# Patient Record
Sex: Female | Born: 1945 | Race: Black or African American | Hispanic: No | Smoking: Former smoker
Health system: Southern US, Community
[De-identification: ages and names within clinical notes are randomized; demographics above are authoritative.]

## PROBLEM LIST (undated history)

## (undated) DIAGNOSIS — I213 ST elevation (STEMI) myocardial infarction of unspecified site: Secondary | ICD-10-CM

## (undated) DIAGNOSIS — Z95810 Presence of automatic (implantable) cardiac defibrillator: Secondary | ICD-10-CM

## (undated) DIAGNOSIS — I2510 Atherosclerotic heart disease of native coronary artery without angina pectoris: Secondary | ICD-10-CM

## (undated) DIAGNOSIS — I10 Essential (primary) hypertension: Secondary | ICD-10-CM

## (undated) DIAGNOSIS — I429 Cardiomyopathy, unspecified: Secondary | ICD-10-CM

## (undated) DIAGNOSIS — I5021 Acute systolic (congestive) heart failure: Secondary | ICD-10-CM

## (undated) DIAGNOSIS — E079 Disorder of thyroid, unspecified: Secondary | ICD-10-CM

## (undated) DIAGNOSIS — Z7901 Long term (current) use of anticoagulants: Secondary | ICD-10-CM

## (undated) DIAGNOSIS — M109 Gout, unspecified: Secondary | ICD-10-CM

## (undated) DIAGNOSIS — H9190 Unspecified hearing loss, unspecified ear: Secondary | ICD-10-CM

## (undated) DIAGNOSIS — E119 Type 2 diabetes mellitus without complications: Secondary | ICD-10-CM

## (undated) DIAGNOSIS — Z794 Long term (current) use of insulin: Secondary | ICD-10-CM

## (undated) DIAGNOSIS — I253 Aneurysm of heart: Secondary | ICD-10-CM

## (undated) HISTORY — DX: Presence of automatic (implantable) cardiac defibrillator: Z95.810

## (undated) HISTORY — DX: Essential (primary) hypertension: I10

## (undated) HISTORY — DX: Disorder of thyroid, unspecified: E07.9

## (undated) HISTORY — DX: ST elevation (STEMI) myocardial infarction of unspecified site: I21.3

## (undated) HISTORY — PX: CARDIAC CATHETERIZATION: SHX172

## (undated) HISTORY — DX: Cardiomyopathy, unspecified: I42.9

## (undated) HISTORY — PX: COLONOSCOPY: SHX174

## (undated) HISTORY — DX: Type 2 diabetes mellitus without complications: E11.9

## (undated) HISTORY — PX: BREAST BIOPSY: SHX20

## (undated) HISTORY — DX: Long term (current) use of anticoagulants: Z79.01

## (undated) HISTORY — DX: Unspecified hearing loss, unspecified ear: H91.90

## (undated) HISTORY — DX: Acute systolic (congestive) heart failure: I50.21

## (undated) HISTORY — DX: Aneurysm of heart: I25.3

## (undated) HISTORY — DX: Type 2 diabetes mellitus without complications: Z79.4

## (undated) HISTORY — PX: CHOLECYSTECTOMY: SHX55

## (undated) HISTORY — PX: ABDOMINAL HYSTERECTOMY: SHX81

## (undated) HISTORY — PX: APPENDECTOMY: SHX54

## (undated) HISTORY — DX: Atherosclerotic heart disease of native coronary artery without angina pectoris: I25.10

---

## 2015-08-25 DIAGNOSIS — I2510 Atherosclerotic heart disease of native coronary artery without angina pectoris: Secondary | ICD-10-CM | POA: Insufficient documentation

## 2015-09-11 HISTORY — PX: CARDIAC DEFIBRILLATOR PLACEMENT: SHX171

## 2018-08-18 ENCOUNTER — Encounter: Payer: Self-pay | Admitting: Family Medicine

## 2018-08-19 ENCOUNTER — Encounter: Payer: Self-pay | Admitting: Family Medicine

## 2018-08-19 ENCOUNTER — Ambulatory Visit (INDEPENDENT_AMBULATORY_CARE_PROVIDER_SITE_OTHER): Payer: Medicare Other | Admitting: Family Medicine

## 2018-08-19 VITALS — BP 122/78 | HR 72 | Temp 97.9°F | Resp 18 | Ht 66.0 in | Wt 203.0 lb

## 2018-08-19 DIAGNOSIS — I10 Essential (primary) hypertension: Secondary | ICD-10-CM | POA: Diagnosis not present

## 2018-08-19 DIAGNOSIS — Z794 Long term (current) use of insulin: Secondary | ICD-10-CM | POA: Diagnosis not present

## 2018-08-19 DIAGNOSIS — E039 Hypothyroidism, unspecified: Secondary | ICD-10-CM

## 2018-08-19 DIAGNOSIS — E1159 Type 2 diabetes mellitus with other circulatory complications: Secondary | ICD-10-CM | POA: Diagnosis not present

## 2018-08-19 DIAGNOSIS — Z7689 Persons encountering health services in other specified circumstances: Secondary | ICD-10-CM

## 2018-08-19 DIAGNOSIS — E785 Hyperlipidemia, unspecified: Secondary | ICD-10-CM | POA: Diagnosis not present

## 2018-08-19 DIAGNOSIS — J189 Pneumonia, unspecified organism: Secondary | ICD-10-CM

## 2018-08-19 DIAGNOSIS — Z7901 Long term (current) use of anticoagulants: Secondary | ICD-10-CM

## 2018-08-19 DIAGNOSIS — Z95810 Presence of automatic (implantable) cardiac defibrillator: Secondary | ICD-10-CM

## 2018-08-19 DIAGNOSIS — I253 Aneurysm of heart: Secondary | ICD-10-CM

## 2018-08-19 LAB — POCT URINALYSIS DIP (CLINITEK)
Bilirubin, UA: NEGATIVE
Glucose, UA: NEGATIVE mg/dL
Ketones, POC UA: NEGATIVE mg/dL
Leukocytes, UA: NEGATIVE
Nitrite, UA: NEGATIVE
POC PROTEIN,UA: 100 — AB
Spec Grav, UA: >=1.03 — AB (ref 1.010–1.025)
Urobilinogen, UA: 0.2 E.U./dL
pH, UA: 5 (ref 5.0–8.0)

## 2018-08-19 LAB — GLUCOSE, POCT (MANUAL RESULT ENTRY): POC GLUCOSE: 215 mg/dL — AB (ref 70–99)

## 2018-08-19 MED ORDER — PROMETHAZINE-DM 6.25-15 MG/5ML PO SYRP: 5.0000 mL | ORAL_SOLUTION | Freq: Every evening | ORAL | 0 refills | Status: DC | PRN

## 2018-08-19 MED ORDER — BENZONATATE 100 MG PO CAPS: 100.0000 mg | ORAL_CAPSULE | Freq: Three times a day (TID) | ORAL | 0 refills | Status: DC | PRN

## 2018-08-19 MED ORDER — MUCUS DM 30-600 MG PO TB12: 1.0000 | ORAL_TABLET | Freq: Two times a day (BID) | ORAL | 1 refills | Status: DC

## 2018-08-19 NOTE — Patient Instructions (Addendum)
Thank you for choosing Primary Care at [facility] to be your medical home!    Desiree Harrison was seen by Molli Barrows, FNP today.   Desiree Harrison primary care provider is Scot Jun, FNP.   For the best care possible, you should try to see Molli Barrows, FNP-C whenever you come to the clinic.   We look forward to seeing you again soon!  If you have any questions about your visit today, please call us at [phone] or feel free to reach your primary care provider via [place].     For your chest x-ray I would like for you to have it performed on 08/26/2018 at  [facility] the address is [address], [place], [place] [address] I will notify you of the results of your chest x-ray.  At your follow-up appointment I will give you the booster dose of your pneumonia vaccine. You will be notified of your lab results within 7 to 10 days.  You have been referred to cardiology and their office will call you directly to schedule an establish care appointment.   Community-Acquired Pneumonia, Adult Pneumonia is an infection of the lungs. One type of pneumonia can happen while a person is in a hospital. A different type can happen when a person is not in a hospital (community-acquired pneumonia). It is easy for this kind to spread from person to person. It can spread to you if you breathe near an infected person who coughs or sneezes. Some symptoms include:  A dry cough.  A wet (productive) cough.  Fever.  Sweating.  Chest pain.  Follow these instructions at home:  Take over-the-counter and prescription medicines only as told by your doctor. ? Only take cough medicine if you are losing sleep. ? If you were prescribed an antibiotic medicine, take it as told by your doctor. Do not stop taking the antibiotic even if you start to feel better.  Sleep with your head and neck raised (elevated). You can do this by putting a few pillows under your head, or you can sleep in a  recliner.  Do not use tobacco products. These include cigarettes, chewing tobacco, and e-cigarettes. If you need help quitting, ask your doctor.  Drink enough water to keep your pee (urine) clear or pale yellow. A shot (vaccine) can help prevent pneumonia. Shots are often suggested for:  People older than 71 years of age.  People older than 71 years of age: ? Who are having cancer treatment. ? Who have long-term (chronic) lung disease. ? Who have problems with their body's defense system (immune system).  You may also prevent pneumonia if you take these actions:  Get the flu (influenza) shot every year.  Go to the dentist as often as told.  Wash your hands often. If soap and water are not available, use hand sanitizer.  Contact a doctor if:  You have a fever.  You lose sleep because your cough medicine does not help. Get help right away if:  You are short of breath and it gets worse.  You have more chest pain.  Your sickness gets worse. This is very serious if: ? You are an older adult. ? Your body's defense system is weak.  You cough up blood. This information is not intended to replace advice given to you by your health care provider. Make sure you discuss any questions you have with your health care provider. Document Released: 02/13/2008 Document Revised: 02/02/2016 Document Reviewed: 12/22/2014 Elsevier Interactive Patient  Education  2018 Elsevier Inc.  

## 2018-08-19 NOTE — Progress Notes (Signed)
Desiree Harrison, is a 72 y.o. female  [id number]  KVQ:[id number]  DOB - 03-22-1946  CC:  Chief Complaint  Patient presents with  . Establish Care  . Diabetes  . Hyperlipidemia       HPI: Desiree Harrison is a 72 y.o. female is here today to establish care.   Kaitlin Alcindor has Chronic combined systolic and diastolic heart failure ([place]); Diabetes mellitus ([place]); Essential hypertension; Ventricular aneurysm; Long term current use of anticoagulant therapy; Hypothyroidism; Hyperlipidemia; Pneumonia of both lungs due to infectious organism; and ICD (implantable cardioverter-defibrillator) in place on their problem list.    Today's visit:  Desiree Harrison is here today to establish care.   Medical problems significant for ventricular aneurysm, she has an ICD in place, hypertension, hyperlipidemia, diabetes, hypothyroidism, and gout.  Patient was followed by [facility] cardiology in [place], [place] and has since relocated to [place], [place]. 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 2017 ICD placed due to poor LV functioning. She is taking chronic anticoagulation with Warfarin. Last PT/INR 2.1. Last PT/INR check 30 days ago. Denies chest pain, activity intolerance, shortness of breath. She currently has a cough was recently diagnosed with pneumonia. She was treated with Levaquin and feels better overall, remains concern regarding cough.    She also suffers from diabetes unknown of last A1c.  She is currently on a long acting and short acting insulin.  Blood sugars range in mid 200's and 300's most often. Reports blood sugars have always remained high. Denies any denies any knowledge of retinopathy or renal impairment.  Recently diagnosed with pneumonia at a urgent care on 07/27/2018.  She continues to complain of a lingering cough that has not improved. Patient denies new headaches, chest pain, abdominal pain, nausea, new weakness , numbness or tingling, SOB, edema, or worrisome cough.  .   Current medications: Current Outpatient Medications:  .  amLODipine (NORVASC) 5 MG tablet, TAKE 1 TABLET BY MOUTH EVERY DAY, Disp: , Rfl:  .  aspirin EC 81 MG tablet, TAKE 1 TABLET BY MOUTH EVERY DAY, Disp: , Rfl:  .  atorvastatin (LIPITOR) 80 MG tablet, TAKE ONE TABLET BY MOUTH DAILY AT 6 PM, Disp: , Rfl:  .  carvedilol (COREG) 25 MG tablet, TAKE 1 AND 1/2 TABLETS BY MOUTH TWICE A DAY WITH MEALS, Disp: , Rfl:  .  Insulin Lispro Prot & Lispro (HUMALOG 75/25 MIX) (75-25) 100 UNIT/ML Kwikpen, Inject 25 Units into the skin 2 (two) times daily., Disp: , Rfl:  .  warfarin (COUMADIN) 1 MG tablet, Take by mouth., Disp: , Rfl:  .  warfarin (COUMADIN) 3 MG tablet, TAKE 1 TABLET BY MOUTH EVERY DAY AT 6 PM, Disp: , Rfl:  .  colchicine 0.6 MG tablet, Take 1 tablet by mouth as needed for pain., Disp: , Rfl:  .  gabapentin (NEURONTIN) 100 MG capsule, Take 200 mg by mouth 3 (three) times daily., Disp: , Rfl: 1 .  levothyroxine (SYNTHROID, LEVOTHROID) 50 MCG tablet, Take 50 mcg by mouth daily., Disp: , Rfl: 0 .  PROAIR HFA 108 (90 Base) MCG/ACT inhaler, 1 PUFF BY MOUTH EVERY 6 HOURS AS NEEDED AS NEEDED, Disp: , Rfl: 0   Pertinent family medical history: family history includes Breast cancer in her mother; Coronary artery disease in her mother; Diabetes in her mother; Stroke in her father.   Allergies  Allergen Reactions  . Lisinopril Cough    Social History   Socioeconomic History  . Marital status:  Widowed    Spouse name: Not on file  . Number of children: Not on file  . Years of education: Not on file  . Highest education level: Not on file  Occupational History  . Not on file  Social Needs  . Financial resource strain: Not on file  . Food insecurity:    Worry: Not on file    Inability: Not on file  . Transportation needs:    Medical: Not on file    Non-medical: Not on file  Tobacco Use  . Smoking status: Former Research scientist (life sciences)  . Smokeless tobacco: Never Used  Substance and Sexual Activity   . Alcohol use: Not Currently  . Drug use: Never  . Sexual activity: Not on file  Lifestyle  . Physical activity:    Days per week: Not on file    Minutes per session: Not on file  . Stress: Not on file  Relationships  . Social connections:    Talks on phone: Not on file    Gets together: Not on file    Attends religious service: Not on file    Active member of club or organization: Not on file    Attends meetings of clubs or organizations: Not on file    Relationship status: Not on file  . Intimate partner violence:    Fear of current or ex partner: Not on file    Emotionally abused: Not on file    Physically abused: Not on file    Forced sexual activity: Not on file  Other Topics Concern  . Not on file  Social History Narrative  . Not on file   Review of Systems: Constitutional: Negative for fever, chills, diaphoresis, activity change, appetite change and fatigue. HENT: Negative for ear pain, nosebleeds, congestion, facial swelling, rhinorrhea, neck pain, neck stiffness and ear discharge.  Eyes: Negative for pain, discharge, redness, itching and visual disturbance. Respiratory: Negative for cough, choking, chest tightness, shortness of breath, wheezing and stridor.  Cardiovascular: Negative for chest pain, palpitations and leg swelling. Gastrointestinal: Negative for abdominal distention or abdominal tenderness present.  Genitourinary: Negative for dysuria, urgency, frequency, hematuria, flank pain, decreased urine volume, difficulty urinating. Musculoskeletal: Negative for back pain, joint swelling, arthralgia and gait problem. Neurological: Negative for dizziness, tremors, seizures, syncope, facial asymmetry, speech difficulty, weakness, light-headedness, numbness and headaches.  Hematological: Negative for adenopathy. Does not bruise/bleed easily. Psychiatric/Behavioral: Negative for hallucinations, behavioral problems, confusion, dysphoric mood, decreased concentration and  agitation.    Objective:   Vitals:   08/19/18 0852  BP: 122/78  Pulse: 72  Resp: 18  Temp: 97.9 F (36.6 C)  SpO2: 94%    BP Readings from Last 3 Encounters:  08/21/18 126/76  08/19/18 122/78    Filed Weights   08/19/18 0852  Weight: 203 lb (92.1 kg)     Physical Exam: Constitutional: Patient appears well-developed and well-nourished. No distress. HENT: Normocephalic, atraumatic, External right and left ear normal. Oropharynx is clear and moist.  Eyes: Conjunctivae and EOM are normal. PERRLA, no scleral icterus. Neck: Normal ROM. Neck supple. No JVD. No tracheal deviation. No thyromegaly. CVS: RRR, S1/S2 +, no murmurs, no gallops, no carotid bruit.  Pulmonary: Effort and breath sounds normal, no stridor, rhonchi, wheezes, rales.  Abdominal: Soft. BS +, no distension, tenderness, rebound or guarding.  Musculoskeletal: Normal range of motion. No edema and no tenderness.  Neuro: Alert. Normal muscle tone coordination. Normal gait. BUE and BLE strength 5/5. Bilateral hand grips symmetrical. Skin: Skin is warm  and dry. No rash noted. Not diaphoretic. No erythema. No pallor. Psychiatric: Normal mood and affect. Behavior, judgment, thought content normal.     Assessment and plan:  1. Encounter to establish care 2. Type 2 diabetes mellitus with other circulatory complication, with long-term current use of insulin ([place]), Last A1C unknown.  POCT glucose (manual entry)-215 Aim for 30 minutes of exercise most days, with a goal of 150 minutes per week. -Glucose monitoring at minimal of twice daily and keep a log of readings. -Commit to medication adherence and self-adjustment as needed -increase foods containing whole grains (one-half of grain intake). -saturated fat intake should be reduced -reduce intake of trans fat (lowers LDL cholesterol and increases HDL cholesterol) -Eat 4-5 small meals during the day to reduce the risk of becoming hungry. Checking: - Hemoglobin A1c, will  adjust diabetes regimen   3. Essential hypertension, well-controlled today. We have discussed target BP range and blood pressure goal. I have advised patient to check BP regularly and to call us back or report to clinic if the numbers are consistently higher than 140/90. We discussed the importance of compliance with medical therapy and DASH diet recommended, consequences of uncontrolled hypertension discussed.  - continue current BP medications  4. Ventricular aneurysm, chronically anticoagulated with warfarin and followed by the Coumadin clinic.  Patient has been referred to cardiology for chronic management as well as to establish with their Coumadin clinic at [facility]. - Ambulatory referral to Cardiology  5. Long term current use of anticoagulant therapy - Ambulatory referral to Cardiology -Patient reports last INR 2.1.  She will establish with the Coumadin clinic at [facility]   6. Hypothyroidism, unspecified type - Thyroid Panel With TSH Continue levothyroxine at current dose.  Will adjust if warranted by labs.  7. Hyperlipidemia, unspecified hyperlipidemia type - Lipid panel, checking a fasting lipid panel today. -Patient is currently managed on statin therapy. - Ambulatory referral to Cardiology  8. Pneumonia of both lungs due to infectious organism, unspecified part of lung Repeat chest x-ray in 2 weeks. Refilled anti-tussive medication.  - DG Chest 2 View; Future - CBC with Differential, rechecking today  9. ICD (implantable cardioverter-defibrillator) in place - Ambulatory referral to Cardiology   Return in about 6 weeks (around 09/30/2018) for Chronic conditions follow-up.   A total of  40 minutes spent, greater than 50 % of this time was spent counseling and coordination of care.  The patient was given clear instructions to go to ER or return to medical center if symptoms don't improve, worsen or new problems develop. The patient verbalized understanding. The patient  was advised  to call and obtain lab results if they haven't heard anything from out office within 7-10 business days.  Molli Barrows, FNP Primary Care at [facility] [address] [place] [address] [phone]: [phone]    This note has been created with Dragon speech recognition software and Engineer, materials. Any transcriptional errors are unintentional.

## 2018-08-20 LAB — CBC WITH DIFFERENTIAL/PLATELET
Basophils Absolute: 0.1 10*3/uL (ref 0.0–0.2)
Basos: 1 %
EOS (ABSOLUTE): 0.2 10*3/uL (ref 0.0–0.4)
Eos: 3 %
Hematocrit: 43.1 % (ref 34.0–46.6)
Hemoglobin: 13.8 g/dL (ref 11.1–15.9)
Immature Grans (Abs): 0 10*3/uL (ref 0.0–0.1)
Immature Granulocytes: 0 %
Lymphocytes Absolute: 2.1 10*3/uL (ref 0.7–3.1)
Lymphs: 30 %
MCH: 26.6 pg (ref 26.6–33.0)
MCHC: 32 g/dL (ref 31.5–35.7)
MCV: 83 fL (ref 79–97)
Monocytes Absolute: 0.6 10*3/uL (ref 0.1–0.9)
Monocytes: 9 %
NEUTROS PCT: 57 %
Neutrophils Absolute: 4 10*3/uL (ref 1.4–7.0)
Platelets: 226 10*3/uL (ref 150–450)
RBC: 5.19 x10E6/uL (ref 3.77–5.28)
RDW: 14.4 % (ref 12.3–15.4)
WBC: 6.9 10*3/uL (ref 3.4–10.8)

## 2018-08-20 LAB — HEMOGLOBIN A1C
Est. average glucose Bld gHb Est-mCnc: 338 mg/dL
Hgb A1c MFr Bld: 13.4 % — ABNORMAL HIGH (ref 4.8–5.6)

## 2018-08-20 LAB — THYROID PANEL WITH TSH
Free Thyroxine Index: 2.3 (ref 1.2–4.9)
T3 Uptake Ratio: 30 % (ref 24–39)
T4, Total: 7.7 ug/dL (ref 4.5–12.0)
TSH: 4.12 u[IU]/mL (ref 0.450–4.500)

## 2018-08-20 LAB — LIPID PANEL
Chol/HDL Ratio: 3.4 ratio (ref 0.0–4.4)
Cholesterol, Total: 108 mg/dL (ref 100–199)
HDL: 32 mg/dL — ABNORMAL LOW (ref >39)
LDL Calculated: 49 mg/dL (ref 0–99)
Triglycerides: 136 mg/dL (ref 0–149)
VLDL Cholesterol Cal: 27 mg/dL (ref 5–40)

## 2018-08-21 ENCOUNTER — Encounter: Payer: Self-pay | Admitting: Cardiovascular Disease

## 2018-08-21 ENCOUNTER — Ambulatory Visit: Payer: Medicare Other | Admitting: Cardiovascular Disease

## 2018-08-21 ENCOUNTER — Ambulatory Visit (INDEPENDENT_AMBULATORY_CARE_PROVIDER_SITE_OTHER): Payer: Medicare Other | Admitting: Pharmacist Clinician (PhC)/ Clinical Pharmacy Specialist

## 2018-08-21 ENCOUNTER — Telehealth: Payer: Self-pay | Admitting: Family Medicine

## 2018-08-21 VITALS — BP 126/76 | HR 73 | Ht 67.5 in | Wt 206.0 lb

## 2018-08-21 DIAGNOSIS — Z95810 Presence of automatic (implantable) cardiac defibrillator: Secondary | ICD-10-CM

## 2018-08-21 DIAGNOSIS — E1169 Type 2 diabetes mellitus with other specified complication: Secondary | ICD-10-CM

## 2018-08-21 DIAGNOSIS — I10 Essential (primary) hypertension: Secondary | ICD-10-CM

## 2018-08-21 DIAGNOSIS — Z7901 Long term (current) use of anticoagulants: Secondary | ICD-10-CM | POA: Diagnosis not present

## 2018-08-21 DIAGNOSIS — I2510 Atherosclerotic heart disease of native coronary artery without angina pectoris: Secondary | ICD-10-CM

## 2018-08-21 DIAGNOSIS — I253 Aneurysm of heart: Secondary | ICD-10-CM

## 2018-08-21 DIAGNOSIS — E669 Obesity, unspecified: Secondary | ICD-10-CM

## 2018-08-21 DIAGNOSIS — I5042 Chronic combined systolic (congestive) and diastolic (congestive) heart failure: Secondary | ICD-10-CM

## 2018-08-21 DIAGNOSIS — E785 Hyperlipidemia, unspecified: Secondary | ICD-10-CM

## 2018-08-21 DIAGNOSIS — E119 Type 2 diabetes mellitus without complications: Secondary | ICD-10-CM

## 2018-08-21 NOTE — Progress Notes (Signed)
Cardiology Office Note:    Date:  08/21/2018   ID:  Desiree Harrison, DOB 1946/06/13, MRN [id number]  PCP:  Scot Jun, FNP  Cardiologist:  Sanda Klein, MD  Electrophysiologist:  None   Referring MD: Scot Jun, FNP   Chief Complaint  Patient presents with  . Establish Care  . Congestive Heart Failure  . Coronary Artery Disease  . Pacemaker Check  Desiree Harrison is a 72 y.o. female who is being seen today for the evaluation of CHF/CAD/ICD at the request of Scot Jun, FNP.   History of Present Illness:    Desiree Harrison is a 72 y.o. 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  She is on chronic warfarin anticoagulation.  She also has diabetes mellitus requiring insulin, complicated by neuropathy, hypertension and hypercholesterolemia, history of gout.  To date, she has not received therapy from her defibrillator.  She has been relocated to [facility] and is establishing follow-up here with both primary care and cardiology.  She has been seeing Dr. Sabino Donovan at [facility], most recent visit in August 2019.  Her initial myocardial infarction was in November 2016.  She did not have angina pectoris, but her myocardial infarction manifested as nausea and vomiting shortness of breath.  She presented late and she was found to have a persistently occluded LAD that was not amenable to PCI.  There were no other meaningful coronary stenoses.  She had an aneurysmal apex.  Roughly 3 months later she underwent implantation of a defibrillator(Dr. Remus Blake, single-chamber [place] serial  [id number], right ventricular lead [id number], DFT testing was not performed).  She has not required hospitalizations for heart failure as far as I can tell has never had documented LV apical thrombus or embolic events, but there was considerable concern about this possibility because of the aneurysmal apex.  She feels well. The patient specifically denies any chest pain at rest exertion, dyspnea at rest or with exertion, orthopnea, paroxysmal nocturnal dyspnea, syncope, palpitations, focal neurological deficits, intermittent claudication, lower extremity edema, unexplained weight gain, cough, hemoptysis or wheezing.  Comprehensive interrogation of her defibrillator was performed today.  The presenting rhythm is sinus and she does not require ventricular pacing.  Battery voltage is essentially still the beginning of life with estimated generator longevity of 12 years.  Last charge time was 10.8 seconds.  RV lead impedance is 879 ohms (high-voltage 69 ohms), pacing threshold is 0.7 V at 0.4 ms and the sensed R waves are 7.5 mV.  These values are comparable to her previous session.  The device was programmed with 3 zones including a VT 1 zone with therapies of 70 bpm.  Since the device was implanted for primary prevention and there has been no detected ventricular tachycardia since device implantation, I reprogrammed it as a "shock box" with therapy beginning only at 200 bpm, but left a monitor zone at 170 bpm.  Past Medical History:  Diagnosis Date  . Acute systolic heart failure ([place])   . CAD in native artery   . Cardiomyopathy ([place])   . Long term (current) use of anticoagulants   . S/P internal cardiac defibrillator procedure  placed 10/28/15  . STEMI (ST elevation myocardial infarction) ([place])   . Type 2 diabetes mellitus, with long-term current use of insulin ([place])   . Ventricular aneurysm     Past Surgical History:  Procedure Laterality Date  . ABDOMINAL HYSTERECTOMY    . APPENDECTOMY    .  BREAST BIOPSY Left   . CARDIAC CATHETERIZATION    . CHOLECYSTECTOMY      Current Medications: Current Meds  Medication Sig  . amLODipine (NORVASC) 5 MG tablet TAKE 1 TABLET BY MOUTH EVERY DAY  . aspirin EC 81 MG tablet TAKE 1 TABLET BY MOUTH EVERY DAY  . atorvastatin (LIPITOR) 80 MG tablet TAKE ONE TABLET BY MOUTH DAILY AT 6 PM  . benzonatate (TESSALON) 100 MG capsule Take 1-2 capsules (100-200 mg total) by mouth 3 (three) times daily as needed for cough.  . carvedilol (COREG) 25 MG tablet Take 25 mg by mouth 2 (two) times daily with a meal.   . Dextromethorphan-guaiFENesin (MUCUS DM) 30-600 MG TB12 Take 1 tablet by mouth 2 (two) times daily.  Marland Kitchen gabapentin (NEURONTIN) 100 MG capsule Take 200 mg by mouth 2 (two) times daily.   . insulin lispro (HUMALOG) 100 UNIT/ML injection Inject 10 Units into the skin 3 (three) times daily before meals.  . insulin lispro protamine-lispro (HUMALOG 50/50 MIX) (50-50) 100 UNIT/ML SUSP injection Inject 40 Units into the skin 2 (two) times daily.  Marland Kitchen levothyroxine (SYNTHROID, LEVOTHROID) 50 MCG tablet Take 50 mcg by mouth daily.  Marland Kitchen PROAIR HFA 108 (90 Base) MCG/ACT inhaler 1 PUFF BY MOUTH EVERY 6 HOURS AS NEEDED AS NEEDED  . promethazine-dextromethorphan (PROMETHAZINE-DM) 6.25-15 MG/5ML syrup Take 5 mLs by mouth at bedtime as needed for cough.  . warfarin (COUMADIN) 3 MG tablet TAKE 1 TABLET BY MOUTH EVERY DAY AT 6 PM  . [DISCONTINUED] warfarin (COUMADIN) 1 MG tablet Take by mouth.     Allergies:   Lisinopril   Social History   Socioeconomic History  . Marital status: Widowed    Spouse name: Not on file  . Number of children: Not on file  . Years of education: Not on file  . Highest education level: Not on file  Occupational History  . Not on file  Social Needs  . Financial resource strain: Not on file  . Food insecurity:    Worry: Not on file    Inability: Not on file  . Transportation needs:    Medical: Not on file    Non-medical: Not on file    Tobacco Use  . Smoking status: Former Research scientist (life sciences)  . Smokeless tobacco: Never Used  Substance and Sexual Activity  . Alcohol use: Not Currently  . Drug use: Never  . Sexual activity: Not on file  Lifestyle  . Physical activity:    Days per week: Not on file    Minutes per session: Not on file  . Stress: Not on file  Relationships  . Social connections:    Talks on phone: Not on file    Gets together: Not on file    Attends religious service: Not on file    Active member of club or organization: Not on file    Attends meetings of clubs or organizations: Not on file    Relationship status: Not on file  Other Topics Concern  . Not on file  Social History Narrative  . Not on file     Family History: The patient's family history includes Breast cancer in her mother; Coronary  artery disease in her mother; Diabetes in her mother; Stroke in her father.  The patient's mother had bypass surgery at age 48 but lived to be 71 years old.  Patient's grandmother died with a myocardial infarction at age 54.  ROS:   Please see the history of present illness.     All other systems reviewed and are negative.  EKGs/Labs/Other Studies Reviewed:    The following studies were reviewed today: Extensive review of office notes from Dr. Sabino Donovan and the defibrillator implantation procedure from Dr. Remus Blake.  Review of previous echocardiogram reports.  Review of labs.  EKG:  EKG is  ordered today.  The ekg ordered today demonstrates sinus rhythm, generalized low voltage with very delayed R wave progression across the anterior precordial leads, but without repolarization abnormalities.  QTc 42 ms.  Recent Labs: 08/19/2018: Hemoglobin 13.8; Platelets 226; TSH 4.120  Recent Lipid Panel    Component Value Date/Time   CHOL 108 08/19/2018 0919   TRIG 136 08/19/2018 0919   HDL 32 (L) 08/19/2018 0919   CHOLHDL 3.4 08/19/2018 0919   LDLCALC 49 08/19/2018 0919    Physical Exam:    VS:  BP 126/76 (BP  Location: Left Arm, Patient Position: Sitting, Cuff Size: Large)   Pulse 73   Ht 5' 7.5" (1.715 m)   Wt 206 lb (93.4 kg)   BMI 31.79 kg/m   Wt Readings from Last 3 Encounters:  08/21/18 206 lb (93.4 kg)  08/19/18 203 lb (92.1 kg)     GEN: Mildly obese, well nourished, well developed in no acute distress HEENT: Normal NECK: No JVD; No carotid bruits LYMPHATICS: No lymphadenopathy CARDIAC: RRR, no murmurs, rubs, gallops RESPIRATORY:  Clear to auscultation without rales, wheezing or rhonchi  ABDOMEN: Soft, non-tender, non-distended MUSCULOSKELETAL:  No edema; No deformity  SKIN: Warm and dry NEUROLOGIC:  Alert and oriented x 3 PSYCHIATRIC:  Normal affect   ASSESSMENT:    1. Coronary artery disease involving native coronary artery of native heart without angina pectoris   2. Chronic combined systolic and diastolic heart failure ([place])   3. Left ventricular aneurysm   4. Long term current use of anticoagulant   5. ICD (implantable cardioverter-defibrillator) in place   6. Dyslipidemia (high LDL; low HDL)   7. Diabetes mellitus type 2 in obese ([place])   8. Essential hypertension    PLAN:    In order of problems listed above:  1. CHF: Well compensated, NYHA functional class I, on appropriate treatment with carvedilol, but not on angiotensin receptor blocker (history of cough with ACE inhibitor).  Improved ejection fraction up to 50%.  Her cardiologist note from August reports that she was receiving losartan 100 mg once daily, but this is not her current medication list.  It may simply be an error.  We will ask patient to check when she gets home. 2. CAD: Asymptomatic.  Note that she never had angina before her myocardial infarction but presented with nausea, vomiting, dyspnea.  On aspirin and statin. 3. LV aneurysm: Without confirmed thrombus or embolic events in the past. 4. Warfarin: No bleeding complications despite simultaneous use of aspirin.  At this point in time I do not see  any reason to change the plan for prevention of systemic embolism.  If she does have bleeding complications may have to revisit the risks/benefits of chronic anticoagulation with warfarin.  Enrolled in our Coumadin clinic. 5. ICD: Normal device function.  Enrolled in our device clinic.  Since she received the  device for primary prevention and in the couple of years since implantation there have been no episodes of even nonsustained VT, I reprogrammed it to a single treatment zone VF at 200 bpm. 6. HLP: Excellent lipid profile with exception of a low HDL cholesterol.  This might improve with increased physical activity and weight loss. 7. DM: Notes report that she was intolerant to [place], although an SGLT2 inhibitor would be a good choice in a patient with depressed EF from coronary disease.  GLP-1 agonist such as Victoza is another consideration.  Avoid Actos. 8. HTN: Excellent control.  Need to clarify whether or not she is still taking losartan.   Medication Adjustments/Labs and Tests Ordered: Current medicines are reviewed at length with the patient today.  Concerns regarding medicines are outlined above.  No orders of the defined types were placed in this encounter.  No orders of the defined types were placed in this encounter.   Patient Instructions  Medication Instructions:  Dr Sallyanne Kuster recommends that you continue on your current medications as directed. Please refer to the Current Medication list given to you today.  If you need a refill on your cardiac medications before your next appointment, please call your pharmacy.   Testing/Procedures: Remote monitoring is used to monitor your Pacemaker or ICD from home. This monitoring reduces the number of office visits required to check your device to one time per year. It allows [place] to keep an eye on the functioning of your device to ensure it is working properly. You are scheduled for a device check from home on Thursday, March 12th, 2020.  You may send your transmission at any time that day. If you have a wireless device, the transmission will be sent automatically. After your physician reviews your transmission, you will receive a postcard with your next transmission date.  To improve our patient care and to more adequately follow your device, [facility] has decided, as a practice, to start following each patient four times a year with your home monitor. This means that you may experience a remote appointment that is close to an in-office appointment with your physician. Your insurance will apply at the same rate as other remote monitoring transmissions.  Follow-Up: At [facility], you and your health needs are our priority.  As part of our continuing mission to provide you with exceptional heart care, we have created designated Provider Care Teams.  These Care Teams include your primary Cardiologist (physician) and Advanced Practice Providers (APPs -  Physician Assistants and Nurse Practitioners) who all work together to provide you with the care you need, when you need it. You will need a follow up appointment in 12 months. You may see Sanda Klein, MD or one of the following Advanced Practice Providers on your designated Care Team: [place], [place] . Fabian Sharp, PA-C . You will receive a reminder letter in the mail two months in advance. If you don't receive a letter, please call our office to schedule the follow-up appointment.    Signed, Sanda Klein, MD  08/21/2018 1:05 PM    [facility]

## 2018-08-21 NOTE — Telephone Encounter (Signed)
Contact patient to advise that her most recent labs were significant for an abnormal hemoglobin A1c.  Her current level of her A1c is 13.4.  I would like for her to increase the dose of her Humalog 50-50 to 50 units twice daily continue short acting insulin 10 units with each meal.  I would also like for her to bring her meter to her next follow-up visit.  Her labs were within expected range.  No other medication changes.

## 2018-08-21 NOTE — Patient Instructions (Addendum)
Medication Instructions:  Dr Sallyanne Kuster recommends that you continue on your current medications as directed. Please refer to the Current Medication list given to you today.  If you need a refill on your cardiac medications before your next appointment, please call your pharmacy.   Testing/Procedures: Remote monitoring is used to monitor your Pacemaker or ICD from home. This monitoring reduces the number of office visits required to check your device to one time per year. It allows [place] to keep an eye on the functioning of your device to ensure it is working properly. You are scheduled for a device check from home on Thursday, March 12th, 2020. You may send your transmission at any time that day. If you have a wireless device, the transmission will be sent automatically. After your physician reviews your transmission, you will receive a postcard with your next transmission date.  To improve our patient care and to more adequately follow your device, [facility] has decided, as a practice, to start following each patient four times a year with your home monitor. This means that you may experience a remote appointment that is close to an in-office appointment with your physician. Your insurance will apply at the same rate as other remote monitoring transmissions.  Follow-Up: At [facility], you and your health needs are our priority.  As part of our continuing mission to provide you with exceptional heart care, we have created designated Provider Care Teams.  These Care Teams include your primary Cardiologist (physician) and Advanced Practice Providers (APPs -  Physician Assistants and Nurse Practitioners) who all work together to provide you with the care you need, when you need it. You will need a follow up appointment in 12 months. You may see Sanda Klein, MD or one of the following Advanced Practice Providers on your designated Care Team: [place], [place] . Fabian Sharp, PA-C . You will receive a  reminder letter in the mail two months in advance. If you don't receive a letter, please call our office to schedule the follow-up appointment.

## 2018-08-22 DIAGNOSIS — I253 Aneurysm of heart: Secondary | ICD-10-CM | POA: Insufficient documentation

## 2018-08-22 DIAGNOSIS — J189 Pneumonia, unspecified organism: Secondary | ICD-10-CM | POA: Insufficient documentation

## 2018-08-22 DIAGNOSIS — E119 Type 2 diabetes mellitus without complications: Secondary | ICD-10-CM | POA: Insufficient documentation

## 2018-08-22 DIAGNOSIS — E039 Hypothyroidism, unspecified: Secondary | ICD-10-CM | POA: Insufficient documentation

## 2018-08-22 DIAGNOSIS — Z95810 Presence of automatic (implantable) cardiac defibrillator: Secondary | ICD-10-CM | POA: Insufficient documentation

## 2018-08-22 DIAGNOSIS — E785 Hyperlipidemia, unspecified: Secondary | ICD-10-CM | POA: Insufficient documentation

## 2018-08-22 DIAGNOSIS — I10 Essential (primary) hypertension: Secondary | ICD-10-CM | POA: Insufficient documentation

## 2018-08-22 DIAGNOSIS — Z7901 Long term (current) use of anticoagulants: Secondary | ICD-10-CM | POA: Insufficient documentation

## 2018-08-22 NOTE — Telephone Encounter (Signed)
Patient notified of lab results & recommendations. Expressed understanding.  Repeated back new insulin instructions correctly.

## 2018-08-27 ENCOUNTER — Telehealth: Payer: Self-pay | Admitting: Family Medicine

## 2018-08-27 ENCOUNTER — Ambulatory Visit (HOSPITAL_COMMUNITY)
Admission: RE | Admit: 2018-08-27 | Discharge: 2018-08-27 | Disposition: A | Discharge status: Home / Self Care | Payer: Medicare Other | Source: Ambulatory Visit | Attending: Family Medicine | Admitting: Family Medicine

## 2018-08-27 DIAGNOSIS — Z7901 Long term (current) use of anticoagulants: Secondary | ICD-10-CM | POA: Insufficient documentation

## 2018-08-27 DIAGNOSIS — J189 Pneumonia, unspecified organism: Secondary | ICD-10-CM | POA: Diagnosis present

## 2018-08-27 LAB — POCT INR: INR: 2.2 (ref 2.0–3.0)

## 2018-08-27 MED ORDER — AMOXICILLIN-POT CLAVULANATE 875-125 MG PO TABS: 1.0000 | ORAL_TABLET | Freq: Two times a day (BID) | ORAL | 0 refills | Status: DC

## 2018-08-27 NOTE — Progress Notes (Signed)
Contact patient to advise I would like to place her on another round of antibiotic as her check x-ray is suspicious for continued pneumonia. If she is still having of cough and shortness of breath, please advise patient to schedule a follow-up with me in two weeks instead of 1/21 visit. I am placing her on  Augmentin 875 BID x 10 days

## 2018-08-27 NOTE — Telephone Encounter (Signed)
Left voice mail to call back 

## 2018-08-27 NOTE — Addendum Note (Signed)
Addended by: Scot Jun on: 08/27/2018 04:42 PM   Modules accepted: Orders

## 2018-08-27 NOTE — Telephone Encounter (Signed)
Contact patient to advise I would like to place her on another round of antibiotic as her check x-ray is suspicious for continued pneumonia. If she is still having of cough and shortness of breath, please advise patient to schedule a follow-up with me in two weeks instead of 1/21 visit. I am placing her on  Augmentin 875 BID x 10 days

## 2018-09-01 ENCOUNTER — Telehealth: Payer: Self-pay | Admitting: Family Medicine

## 2018-09-01 NOTE — Telephone Encounter (Signed)
Patient notified of xray results & recommendations. Expressed understanding. 

## 2018-09-01 NOTE — Telephone Encounter (Signed)
Patient called requesting X-Ray results, please follow up.

## 2018-09-17 ENCOUNTER — Ambulatory Visit (INDEPENDENT_AMBULATORY_CARE_PROVIDER_SITE_OTHER): Payer: Medicare Other | Admitting: Pharmacist

## 2018-09-17 DIAGNOSIS — I253 Aneurysm of heart: Secondary | ICD-10-CM | POA: Diagnosis not present

## 2018-09-17 DIAGNOSIS — Z7901 Long term (current) use of anticoagulants: Secondary | ICD-10-CM | POA: Diagnosis not present

## 2018-09-17 LAB — POCT INR: INR: 2 (ref 2.0–3.0)

## 2018-09-30 ENCOUNTER — Ambulatory Visit: Payer: Medicare Other | Admitting: Family Medicine

## 2018-09-30 LAB — CUP PACEART INCLINIC DEVICE CHECK
Date Time Interrogation Session: 20200121140810
Implantable Pulse Generator Implant Date: 20170217
Pulse Gen Serial Number: 213123

## 2018-10-01 ENCOUNTER — Telehealth: Payer: Self-pay

## 2018-10-01 NOTE — Telephone Encounter (Signed)
lmtcb

## 2018-10-01 NOTE — Telephone Encounter (Signed)
-----   Message from Sanda Klein, MD sent at 08/21/2018  1:06 PM EST ----- Please asked her if she is taking losartan 100 mg once daily.  It is less than when she saw her cardiologist in [facility] in August, but no longer on list of medicines. She should be on it. MCr

## 2018-10-02 ENCOUNTER — Telehealth: Payer: Self-pay | Admitting: Cardiovascular Disease

## 2018-10-02 MED ORDER — LOSARTAN POTASSIUM 100 MG PO TABS: 100.0000 mg | ORAL_TABLET | Freq: Every day | ORAL | 3 refills | Status: DC

## 2018-10-02 NOTE — Telephone Encounter (Signed)
Advised pt of Dr. Sallyanne Kuster recommendation to take losartan 100 mg once daily. Pt states she has not been taking the medication. Informed her that although it is no longer on her list of medications she should be on the medication. Informed her that the medication will be added to her medication list and sent to her pharmacy on file. Pt agreeable with this and verbalized understanding

## 2018-10-02 NOTE — Telephone Encounter (Signed)
Follow up:    Patient returning call from yesterday concerning some medication. Please call patient.

## 2018-10-02 NOTE — Telephone Encounter (Signed)
See previous encounter

## 2018-10-02 NOTE — Telephone Encounter (Signed)
Patient returned call

## 2018-10-02 NOTE — Telephone Encounter (Signed)
Pt called back stating she now remember that she was taken off the Losartan about a year ago from her pcp due to having a bad cough. She report the cough improved after stopping medication and her BP has been good. Will route to MD to make aware.

## 2018-10-03 ENCOUNTER — Telehealth: Payer: Self-pay | Admitting: Family Medicine

## 2018-10-03 MED ORDER — LEVOTHYROXINE SODIUM 50 MCG PO TABS: 50.0000 ug | ORAL_TABLET | Freq: Every day | ORAL | 1 refills | Status: DC

## 2018-10-03 NOTE — Telephone Encounter (Signed)
Left message to call back  

## 2018-10-03 NOTE — Telephone Encounter (Signed)
Pt calling back asking if she need to pick up prescription for Losartan and if so she would MD reasoning for why she should start back taking it. Will routed to MD. Pt also wanted MD to be made aware that she took Lisinopril as few years ago and caused her to have a cough as well. Will route to MD.

## 2018-10-03 NOTE — Telephone Encounter (Signed)
Follow up  ° ° °Patient is returning call.  °

## 2018-10-03 NOTE — Telephone Encounter (Signed)
Called patient, advised of message from Dr.Croitoru.  Patient verbalized understanding.   

## 2018-10-03 NOTE — Telephone Encounter (Signed)
Patient called requesting levothyroxine (SYNTHROID, LEVOTHROID) 50 MCG tablet [[id number]] Patient hasn't received this medication from PCP. Please follow up.

## 2018-10-03 NOTE — Telephone Encounter (Signed)
Medication refilled completed for levothyroxine

## 2018-10-03 NOTE — Telephone Encounter (Signed)
No , do not take losartan if it caused cough. It was listed on notes from her previous Cardiologist. We can discuss at her future appointment whether we should try again, but it is not urgent. MCr

## 2018-10-15 ENCOUNTER — Ambulatory Visit: Payer: Medicare Other | Admitting: Family Medicine

## 2018-10-15 ENCOUNTER — Ambulatory Visit (INDEPENDENT_AMBULATORY_CARE_PROVIDER_SITE_OTHER): Payer: Medicare Other | Admitting: Pharmacist

## 2018-10-15 DIAGNOSIS — Z7901 Long term (current) use of anticoagulants: Secondary | ICD-10-CM | POA: Diagnosis not present

## 2018-10-15 DIAGNOSIS — I253 Aneurysm of heart: Secondary | ICD-10-CM | POA: Diagnosis not present

## 2018-10-15 LAB — POCT INR: INR: 1.9 — AB (ref 2.0–3.0)

## 2018-10-16 ENCOUNTER — Ambulatory Visit (INDEPENDENT_AMBULATORY_CARE_PROVIDER_SITE_OTHER): Payer: Medicare Other | Admitting: Family Medicine

## 2018-10-16 ENCOUNTER — Encounter: Payer: Self-pay | Admitting: Family Medicine

## 2018-10-16 VITALS — BP 129/85 | HR 72 | Resp 17 | Ht 67.5 in | Wt 205.4 lb

## 2018-10-16 DIAGNOSIS — E1159 Type 2 diabetes mellitus with other circulatory complications: Secondary | ICD-10-CM

## 2018-10-16 DIAGNOSIS — E785 Hyperlipidemia, unspecified: Secondary | ICD-10-CM

## 2018-10-16 DIAGNOSIS — Z1211 Encounter for screening for malignant neoplasm of colon: Secondary | ICD-10-CM

## 2018-10-16 DIAGNOSIS — Z23 Encounter for immunization: Secondary | ICD-10-CM | POA: Diagnosis not present

## 2018-10-16 DIAGNOSIS — I10 Essential (primary) hypertension: Secondary | ICD-10-CM

## 2018-10-16 DIAGNOSIS — Z794 Long term (current) use of insulin: Secondary | ICD-10-CM | POA: Diagnosis not present

## 2018-10-16 DIAGNOSIS — Z95810 Presence of automatic (implantable) cardiac defibrillator: Secondary | ICD-10-CM

## 2018-10-16 DIAGNOSIS — E039 Hypothyroidism, unspecified: Secondary | ICD-10-CM

## 2018-10-16 LAB — GLUCOSE, POCT (MANUAL RESULT ENTRY): POC Glucose: 134 mg/dl — AB (ref 70–99)

## 2018-10-16 MED ORDER — INSULIN LISPRO PROT & LISPRO (50-50 MIX) 100 UNIT/ML ~~LOC~~ SUSP: 50.0000 [IU] | Freq: Two times a day (BID) | SUBCUTANEOUS | 3 refills | Status: DC

## 2018-10-16 MED ORDER — INSULIN PEN NEEDLE 30G X 8 MM MISC: 1.0000 | 3 refills | Status: DC | PRN

## 2018-10-16 MED ORDER — INSULIN LISPRO 100 UNIT/ML ~~LOC~~ SOLN: 10.0000 [IU] | Freq: Three times a day (TID) | SUBCUTANEOUS | 5 refills | Status: DC

## 2018-10-16 MED ORDER — GABAPENTIN 100 MG PO CAPS: 200.0000 mg | ORAL_CAPSULE | Freq: Two times a day (BID) | ORAL | 2 refills | Status: DC

## 2018-10-16 NOTE — Progress Notes (Signed)
Established Patient Office Visit  Subjective:  Patient ID: Desiree Harrison, female    DOB: 30-Sep-1945  Age: 73 y.o. MRN: [id number]  CC:  Chief Complaint  Patient presents with  . Diabetes  . Hypertension  . Hyperlipidemia    HPI Desiree Harrison presents for hypertension and diabetes follow-up.  Desiree Harrison has Chronic combined systolic and diastolic heart failure ([place]); Diabetes mellitus ([place]); Essential hypertension; Ventricular aneurysm; Long term current use of anticoagulant therapy; Hypothyroidism; Hyperlipidemia; Pneumonia of both lungs due to infectious organism; ICD (implantable cardioverter-defibrillator) in place; and Long term (current) use of anticoagulants on their problem list.   Diabetes Last A1c 13.4.  During last office visit and after receipt of recent A1c medications were adjusted to increase her insulin. She reports improved glycemic control at home with readings within the 150s to 170s. Denies recent hypoglycemia, although notes a few lows since starting current insulin regimen. Denies 3 P's.  Hypertension Blood pressure well controlled.  Denies chest pain, shortness of breath, new weakness, or lower extremity edema.  She checks her blood pressure intermittently and has not had any blood pressure readings greater than 130/90.  She she is prescribed chronic anticoagulation therapy for management of a left ventricular thrombus.  Her most recent INR was yesterday which was 1.9.  No changes were made to her current dose of Coumadin regimen. Past Medical History:  Diagnosis Date  . Acute systolic heart failure ([place])   . CAD in native artery   . Cardiomyopathy ([place])   . Long term (current) use of anticoagulants   . S/P internal cardiac defibrillator procedure    placed 10/28/15  . STEMI (ST elevation myocardial infarction) ([place])   . Type 2 diabetes mellitus, with long-term current use of insulin ([place])   . Ventricular aneurysm     Past Surgical History:  Procedure  Laterality Date  . ABDOMINAL HYSTERECTOMY    . APPENDECTOMY    . BREAST BIOPSY Left   . CARDIAC CATHETERIZATION    . CHOLECYSTECTOMY      Family History  Problem Relation Age of Onset  . Breast cancer Mother   . Coronary artery disease Mother   . Diabetes Mother   . Stroke Father     Social History   Socioeconomic History  . Marital status: Widowed    Spouse name: Not on file  . Number of children: Not on file  . Years of education: Not on file  . Highest education level: Not on file  Occupational History  . Not on file  Social Needs  . Financial resource strain: Not on file  . Food insecurity:    Worry: Not on file    Inability: Not on file  . Transportation needs:    Medical: Not on file    Non-medical: Not on file  Tobacco Use  . Smoking status: Former Research scientist (life sciences)  . Smokeless tobacco: Never Used  Substance and Sexual Activity  . Alcohol use: Not Currently  . Drug use: Never  . Sexual activity: Not on file  Lifestyle  . Physical activity:    Days per week: Not on file    Minutes per session: Not on file  . Stress: Not on file  Relationships  . Social connections:    Talks on phone: Not on file    Gets together: Not on file    Attends religious service: Not on file    Active member of club or organization: Not on file    Attends meetings of  clubs or organizations: Not on file    Relationship status: Not on file  . Intimate partner violence:    Fear of current or ex partner: Not on file    Emotionally abused: Not on file    Physically abused: Not on file    Forced sexual activity: Not on file  Other Topics Concern  . Not on file  Social History Narrative  . Not on file    Outpatient Medications Prior to Visit  Medication Sig Dispense Refill  . amLODipine (NORVASC) 5 MG tablet TAKE 1 TABLET BY MOUTH EVERY DAY    . aspirin EC 81 MG tablet TAKE 1 TABLET BY MOUTH EVERY DAY    . atorvastatin (LIPITOR) 80 MG tablet TAKE ONE TABLET BY MOUTH DAILY AT 6 PM     . carvedilol (COREG) 25 MG tablet Take 25 mg by mouth 2 (two) times daily with a meal.     . gabapentin (NEURONTIN) 100 MG capsule Take 200 mg by mouth 2 (two) times daily.   1  . insulin lispro (HUMALOG) 100 UNIT/ML injection Inject 10 Units into the skin 3 (three) times daily before meals.    . insulin lispro protamine-lispro (HUMALOG 50/50 MIX) (50-50) 100 UNIT/ML SUSP injection Inject 50 Units into the skin 2 (two) times daily.    Marland Kitchen levothyroxine (SYNTHROID, LEVOTHROID) 50 MCG tablet Take 1 tablet (50 mcg total) by mouth daily. 90 tablet 1  . losartan (COZAAR) 100 MG tablet Take 1 tablet (100 mg total) by mouth daily. 90 tablet 3  . PROAIR HFA 108 (90 Base) MCG/ACT inhaler 1 PUFF BY MOUTH EVERY 6 HOURS AS NEEDED AS NEEDED  0  . warfarin (COUMADIN) 3 MG tablet TAKE 1 TABLET BY MOUTH EVERY DAY AT 6 PM    . amoxicillin-clavulanate (AUGMENTIN) 875-125 MG tablet Take 1 tablet by mouth 2 (two) times daily. 20 tablet 0  . benzonatate (TESSALON) 100 MG capsule Take 1-2 capsules (100-200 mg total) by mouth 3 (three) times daily as needed for cough. 60 capsule 0  . Dextromethorphan-guaiFENesin (MUCUS DM) 30-600 MG TB12 Take 1 tablet by mouth 2 (two) times daily. 30 tablet 1  . promethazine-dextromethorphan (PROMETHAZINE-DM) 6.25-15 MG/5ML syrup Take 5 mLs by mouth at bedtime as needed for cough. 118 mL 0   No facility-administered medications prior to visit.     Allergies  Allergen Reactions  . Lisinopril Cough    ROS Review of Systems Pertinent negatives listed in HPI  Objective:    Physical Exam BP 129/85   Pulse 72   Resp 17   Ht 5' 7.5" (1.715 m)   Wt 205 lb 6.4 oz (93.2 kg)   SpO2 96%   BMI 31.70 kg/m    Constitutional: Patient appears well-developed and well-nourished. No distress. HENT: Normocephalic, atraumatic, External right and left ear normal.  Eyes: Conjunctivae and EOM are normal. PERRLA, no scleral icterus. Neck: Normal ROM. Neck supple. No JVD. No tracheal  deviation. No thyromegaly. CVS: RRR, S1/S2 +, no murmurs, no gallops, no carotid bruit.  Pulmonary: Effort and breath sounds normal, no stridor, rhonchi, wheezes, rales.  Musculoskeletal: Normal range of motion. No edema and no tenderness.  Neuro: Alert. Normal reflexes, muscle tone coordination.  Skin: Skin is warm and dry. No rash noted. Not diaphoretic. No erythema. No pallor. Psychiatric: Normal mood and affect. Behavior, judgment, thought content normal.  Wt Readings from Last 3 Encounters:  10/16/18 205 lb 6.4 oz (93.2 kg)  08/21/18 206 lb (93.4 kg)  08/19/18 203 lb (92.1 kg)     Health Maintenance Due  Topic Date Due  . Hepatitis C Screening  08/19/47  . FOOT EXAM  03/09/1957  . OPHTHALMOLOGY EXAM  03/09/1957  . TETANUS/TDAP  03/09/1966  . MAMMOGRAM  03/09/1997  . COLONOSCOPY  03/09/1997  . DEXA SCAN  03/09/2012  . PNA vac Low Risk Adult (1 of 2 - PCV13) 03/09/2012    There are no preventive care reminders to display for this patient.  Lab Results  Component Value Date   TSH 4.120 08/19/2018   Lab Results  Component Value Date   WBC 6.9 08/19/2018   HGB 13.8 08/19/2018   HCT 43.1 08/19/2018   MCV 83 08/19/2018   PLT 226 08/19/2018   No results found for: NA, K, CHLORIDE, CO2, GLUCOSE, BUN, CREATININE, BILITOT, ALKPHOS, AST, ALT, PROT, ALBUMIN, CALCIUM, ANIONGAP, EGFR, GFR Lab Results  Component Value Date   CHOL 108 08/19/2018   Lab Results  Component Value Date   HDL 32 (L) 08/19/2018   Lab Results  Component Value Date   LDLCALC 49 08/19/2018   Lab Results  Component Value Date   TRIG 136 08/19/2018   Lab Results  Component Value Date   CHOLHDL 3.4 08/19/2018   Lab Results  Component Value Date   HGBA1C 13.4 (H) 08/19/2018      Assessment & Plan:  1. Type 2 diabetes mellitus with other circulatory complication, with long-term current use of insulin (HCC) Blood sugar readings have significantly improved with recent changes to doses of  insulin. We will continue with current regimen no changes made today. Patient will return in 2 months for a A1c check.  Encouraged not to administer any insulin for any blood sugars less than 125. Encouraged to continue with consistent blood sugar checks. - Glucose (CBG)-134 fasting  2. Hyperlipidemia, unspecified hyperlipidemia type Continue with statin therapy  3. Essential hypertension, well-controlled,  no changes in current regimen  4. ICD (implantable cardioverter-defibrillator) in place -Chronic, ongoing  5. Hypothyroidism, unspecified type Recent thyroid test was on the high normal range.  Continue with levothyroxine 25 mcg.  Will repeat in TSH at next visit  6. Colon cancer screening - Ambulatory referral to Gastroenterology No history of abnormal screening.  Meds ordered this encounter  Medications  . insulin lispro protamine-lispro (HUMALOG 50/50 MIX) (50-50) 100 UNIT/ML SUSP injection    Sig: Inject 0.5 mLs (50 Units total) into the skin 2 (two) times daily before a meal.    Dispense:  20 mL    Refill:  3  . insulin lispro (HUMALOG) 100 UNIT/ML injection    Sig: Inject 0.1 mLs (10 Units total) into the skin 3 (three) times daily before meals.    Dispense:  10 mL    Refill:  5    Dispense as pens 3 pens with 5 refills  . gabapentin (NEURONTIN) 100 MG capsule    Sig: Take 2 capsules (200 mg total) by mouth 2 (two) times daily.    Dispense:  60 capsule    Refill:  2  . Insulin Pen Needle (NOVOFINE) 30G X 8 MM MISC    Sig: Inject 10 each into the skin as needed.    Dispense:  200 each    Refill:  3    Follow-up: Return in about 3 months (around 01/14/2019) for diabetes and thyroid.    Molli Barrows, FNP-C

## 2018-10-16 NOTE — Patient Instructions (Signed)
Diabetes Basics    Diabetes (diabetes mellitus) is a long-term (chronic) disease. It occurs when the body does not properly use sugar (glucose) that is released from food after you eat.  Diabetes may be caused by one or both of these problems:  · Your pancreas does not make enough of a hormone called insulin.  · Your body does not react in a normal way to insulin that it makes.  Insulin lets sugars (glucose) go into cells in your body. This gives you energy. If you have diabetes, sugars cannot get into cells. This causes high blood sugar (hyperglycemia).  Follow these instructions at home:  How is diabetes treated?  You may need to take insulin or other diabetes medicines daily to keep your blood sugar in balance. Take your diabetes medicines every day as told by your doctor. List your diabetes medicines here:  Diabetes medicines  · Name of medicine: ______________________________  ? Amount (dose): _______________ Time (a.m./p.m.): _______________ Notes: ___________________________________  · Name of medicine: ______________________________  ? Amount (dose): _______________ Time (a.m./p.m.): _______________ Notes: ___________________________________  · Name of medicine: ______________________________  ? Amount (dose): _______________ Time (a.m./p.m.): _______________ Notes: ___________________________________  If you use insulin, you will learn how to give yourself insulin by injection. You may need to adjust the amount based on the food that you eat. List the types of insulin you use here:  Insulin  · Insulin type: ______________________________  ? Amount (dose): _______________ Time (a.m./p.m.): _______________ Notes: ___________________________________  · Insulin type: ______________________________  ? Amount (dose): _______________ Time (a.m./p.m.): _______________ Notes: ___________________________________  · Insulin type: ______________________________  ? Amount (dose): _______________ Time (a.m./p.m.):  _______________ Notes: ___________________________________  · Insulin type: ______________________________  ? Amount (dose): _______________ Time (a.m./p.m.): _______________ Notes: ___________________________________  · Insulin type: ______________________________  ? Amount (dose): _______________ Time (a.m./p.m.): _______________ Notes: ___________________________________  How do I manage my blood sugar?    Check your blood sugar levels using a blood glucose monitor as directed by your doctor.  Your doctor will set treatment goals for you. Generally, you should have these blood sugar levels:  · Before meals (preprandial): 80-130 mg/dL (4.4-7.2 mmol/L).  · After meals (postprandial): below 180 mg/dL (10 mmol/L).  · A1c level: less than 7%.  Write down the times that you will check your blood sugar levels:  Blood sugar checks  · Time: _______________ Notes: ___________________________________  · Time: _______________ Notes: ___________________________________  · Time: _______________ Notes: ___________________________________  · Time: _______________ Notes: ___________________________________  · Time: _______________ Notes: ___________________________________  · Time: _______________ Notes: ___________________________________    What do I need to know about low blood sugar?  Low blood sugar is called hypoglycemia. This is when blood sugar is at or below 70 mg/dL (3.9 mmol/L). Symptoms may include:  · Feeling:  ? Hungry.  ? Worried or nervous (anxious).  ? Sweaty and clammy.  ? Confused.  ? Dizzy.  ? Sleepy.  ? Sick to your stomach (nauseous).  · Having:  ? A fast heartbeat.  ? A headache.  ? A change in your vision.  ? Tingling or no feeling (numbness) around the mouth, lips, or tongue.  ? Jerky movements that you cannot control (seizure).  · Having trouble with:  ? Moving (coordination).  ? Sleeping.  ? Passing out (fainting).  ? Getting upset easily (irritability).  Treating low blood sugar  To treat low blood  sugar, eat or drink something sugary right away. If you can think clearly and swallow safely, follow the 15:15   rule:  · Take 15 grams of a fast-acting carb (carbohydrate). Talk with your doctor about how much you should take.  · Some fast-acting carbs are:  ? Sugar tablets (glucose pills). Take 3-4 glucose pills.  ? 6-8 pieces of hard candy.  ? 4-6 oz (120-150 mL) of fruit juice.  ? 4-6 oz (120-150 mL) of regular (not diet) soda.  ? 1 Tbsp (15 mL) honey or sugar.  · Check your blood sugar 15 minutes after you take the carb.  · If your blood sugar is still at or below 70 mg/dL (3.9 mmol/L), take 15 grams of a carb again.  · If your blood sugar does not go above 70 mg/dL (3.9 mmol/L) after 3 tries, get help right away.  · After your blood sugar goes back to normal, eat a meal or a snack within 1 hour.  Treating very low blood sugar  If your blood sugar is at or below 54 mg/dL (3 mmol/L), you have very low blood sugar (severe hypoglycemia). This is an emergency. Do not wait to see if the symptoms will go away. Get medical help right away. Call your local emergency services (911 in the [place] Do not drive yourself to the hospital.  Questions to ask your health care provider  · Do I need to meet with a diabetes educator?  · What equipment will I need to care for myself at home?  · What diabetes medicines do I need? When should I take them?  · How often do I need to check my blood sugar?  · What number can I call if I have questions?  · When is my next doctor's visit?  · Where can I find a support group for people with diabetes?  Where to find more information  · American Diabetes Association: www.diabetes.org  · American Association of Diabetes Educators: www.diabeteseducator.org/patient-resources  Contact a doctor if:  · Your blood sugar is at or above 240 mg/dL (13.3 mmol/L) for 2 days in a row.  · You have been sick or have had a fever for 2 days or more, and you are not getting better.  · You have any of these  problems for more than 6 hours:  ? You cannot eat or drink.  ? You feel sick to your stomach (nauseous).  ? You throw up (vomit).  ? You have watery poop (diarrhea).  Get help right away if:  · Your blood sugar is lower than 54 mg/dL (3 mmol/L).  · You get confused.  · You have trouble:  ? Thinking clearly.  ? Breathing.  Summary  · Diabetes (diabetes mellitus) is a long-term (chronic) disease. It occurs when the body does not properly use sugar (glucose) that is released from food after digestion.  · Take insulin and diabetes medicines as told.  · Check your blood sugar every day, as often as told.  · Keep all follow-up visits as told by your doctor. This is important.  This information is not intended to replace advice given to you by your health care provider. Make sure you discuss any questions you have with your health care provider.  Document Released: 11/29/2017 Document Revised: 02/17/2018 Document Reviewed: 11/29/2017  [employer]vier Interactive Patient Education © 2019 [employer]

## 2018-10-17 ENCOUNTER — Telehealth: Payer: Self-pay | Admitting: Family Medicine

## 2018-10-17 NOTE — Telephone Encounter (Signed)
Left voicemail to call back to get clarification for Tramadol dx.

## 2018-10-17 NOTE — Telephone Encounter (Signed)
Patient would like a refill of tramadol, PCP has never prescribed this to her. Please follow up

## 2018-10-17 NOTE — Telephone Encounter (Signed)
Need to know what Tramadol was previously prescribed for and who was the original prescriber.

## 2018-10-27 NOTE — Telephone Encounter (Signed)
Left voice mail to call back 

## 2018-10-30 NOTE — Telephone Encounter (Signed)
Left voice mail to call back 

## 2018-11-12 ENCOUNTER — Ambulatory Visit (INDEPENDENT_AMBULATORY_CARE_PROVIDER_SITE_OTHER): Payer: Medicare Other | Admitting: Pharmacist Clinician (PhC)/ Clinical Pharmacy Specialist

## 2018-11-12 ENCOUNTER — Ambulatory Visit (INDEPENDENT_AMBULATORY_CARE_PROVIDER_SITE_OTHER): Payer: Medicare Other

## 2018-11-12 ENCOUNTER — Ambulatory Visit
Admission: EM | Admit: 2018-11-12 | Discharge: 2018-11-12 | Disposition: A | Discharge status: Home / Self Care | Payer: Medicare Other | Attending: Physician Assistant | Admitting: Physician Assistant

## 2018-11-12 DIAGNOSIS — I253 Aneurysm of heart: Secondary | ICD-10-CM | POA: Diagnosis not present

## 2018-11-12 DIAGNOSIS — J181 Lobar pneumonia, unspecified organism: Secondary | ICD-10-CM

## 2018-11-12 DIAGNOSIS — Z7901 Long term (current) use of anticoagulants: Secondary | ICD-10-CM | POA: Diagnosis not present

## 2018-11-12 DIAGNOSIS — J189 Pneumonia, unspecified organism: Secondary | ICD-10-CM

## 2018-11-12 LAB — POCT INR: INR: 1.8 — AB (ref 2.0–3.0)

## 2018-11-12 MED ORDER — BENZONATATE 100 MG PO CAPS: 100.0000 mg | ORAL_CAPSULE | Freq: Three times a day (TID) | ORAL | 0 refills | Status: DC

## 2018-11-12 MED ORDER — AMOXICILLIN-POT CLAVULANATE ER 1000-62.5 MG PO TB12: 2.0000 | ORAL_TABLET | Freq: Two times a day (BID) | ORAL | 0 refills | Status: DC

## 2018-11-12 MED ORDER — DOXYCYCLINE HYCLATE 100 MG PO CAPS: 100.0000 mg | ORAL_CAPSULE | Freq: Two times a day (BID) | ORAL | 0 refills | Status: DC

## 2018-11-12 MED ORDER — ALBUTEROL SULFATE HFA 108 (90 BASE) MCG/ACT IN AERS: 1.0000 | INHALATION_SPRAY | Freq: Four times a day (QID) | RESPIRATORY_TRACT | 0 refills | Status: DC | PRN

## 2018-11-12 NOTE — ED Provider Notes (Signed)
[facility]    CSN: [id number] Arrival date & time: 11/12/18  1003     History   Chief Complaint Chief Complaint  Patient presents with  . Cough    HPI Desiree Harrison is a 73 y.o. female.   73 year old female with history of CAD, CHF, ICD in place, on warfarin, DM, HLD comes in for continued cough.  Patient was first diagnosed with pneumonia back in December, finished a round of Levaquin and saw her PCP 08/27/2018, will repeat chest x-ray showed residual pneumonia, and was put on another course of antibiotics, Augmentin.  She is unsure if symptoms of pneumonia completely resolved prior to current symptom onset.  She has continued nonproductive cough that is worse at night.  She does feel some postnasal drip but no frank rhinorrhea or nasal congestion.  She denies fever, chills, night sweats.  Denies shortness of breath, wheezing.  Denies chest pain, orthopnea, leg swelling.  She has been taking over-the-counter cough medicine for her symptoms.  Former smoker.  Current diabetes uncontrolled, on insulin.     Past Medical History:  Diagnosis Date  . Acute systolic heart failure ([place])   . CAD in native artery   . Cardiomyopathy ([place])   . Long term (current) use of anticoagulants   . S/P internal cardiac defibrillator procedure    placed 10/28/15  . STEMI (ST elevation myocardial infarction) ([place])   . Type 2 diabetes mellitus, with long-term current use of insulin ([place])   . Ventricular aneurysm     Patient Active Problem List   Diagnosis Date Noted  . Long term (current) use of anticoagulants 08/27/2018  . Diabetes mellitus ([place]) 08/22/2018  . Essential hypertension 08/22/2018  . Ventricular aneurysm 08/22/2018  . Long term current use of anticoagulant therapy 08/22/2018  . Hypothyroidism 08/22/2018  . Hyperlipidemia 08/22/2018  . Pneumonia of both lungs due to infectious organism 08/22/2018  . ICD (implantable cardioverter-defibrillator) in place 08/22/2018  .  Chronic combined systolic and diastolic heart failure ([place]) 08/21/2018    Past Surgical History:  Procedure Laterality Date  . ABDOMINAL HYSTERECTOMY    . APPENDECTOMY    . BREAST BIOPSY Left   . CARDIAC CATHETERIZATION    . CHOLECYSTECTOMY      OB History   No obstetric history on file.      Home Medications    Prior to Admission medications   Medication Sig Start Date End Date Taking? Authorizing Provider  albuterol (PROVENTIL HFA;VENTOLIN HFA) 108 (90 Base) MCG/ACT inhaler Inhale 1-2 puffs into the lungs every 6 (six) hours as needed for wheezing or shortness of breath. 11/12/18   Tasia Catchings, Othella Slappey V, PA-C  amLODipine (NORVASC) 5 MG tablet TAKE 1 TABLET BY MOUTH EVERY DAY 01/13/18   [provider]  amoxicillin-clavulanate (AUGMENTIN XR) 1000-62.5 MG 12 hr tablet Take 2 tablets by mouth 2 (two) times daily. 11/12/18   Ok Edwards, PA-C  aspirin EC 81 MG tablet TAKE 1 TABLET BY MOUTH EVERY DAY 09/16/15   [provider]  atorvastatin (LIPITOR) 80 MG tablet TAKE ONE TABLET BY MOUTH DAILY AT 6 PM 01/27/18   [provider]  benzonatate (TESSALON) 100 MG capsule Take 1 capsule (100 mg total) by mouth every 8 (eight) hours. 11/12/18   Tasia Catchings, Osvaldo Lamping V, PA-C  carvedilol (COREG) 25 MG tablet Take 25 mg by mouth 2 (two) times daily with a meal.  08/27/17   [provider]  doxycycline (VIBRAMYCIN) 100 MG capsule Take 1 capsule (100  mg total) by mouth 2 (two) times daily. 11/12/18   Tasia Catchings, Jolynne Spurgin V, PA-C  gabapentin (NEURONTIN) 100 MG capsule Take 2 capsules (200 mg total) by mouth 2 (two) times daily. 10/16/18   Scot Jun, FNP  insulin lispro (HUMALOG) 100 UNIT/ML injection Inject 0.1 mLs (10 Units total) into the skin 3 (three) times daily before meals. 10/16/18   Scot Jun, FNP  insulin lispro protamine-lispro (HUMALOG 50/50 MIX) (50-50) 100 UNIT/ML SUSP injection Inject 0.5 mLs (50 Units total) into the skin 2 (two) times daily before a meal. 10/16/18   Scot Jun, FNP   Insulin Pen Needle (NOVOFINE) 30G X 8 MM MISC Inject 10 each into the skin as needed. 10/16/18   Scot Jun, FNP  levothyroxine (SYNTHROID, LEVOTHROID) 50 MCG tablet Take 1 tablet (50 mcg total) by mouth daily. 10/03/18   Scot Jun, FNP  losartan (COZAAR) 100 MG tablet Take 1 tablet (100 mg total) by mouth daily. 10/02/18 12/31/18  Croitoru, Mihai, MD  warfarin (COUMADIN) 3 MG tablet TAKE 1 TABLET BY MOUTH EVERY DAY AT 6 PM 10/14/17   [provider]    Family History Family History  Problem Relation Age of Onset  . Breast cancer Mother   . Coronary artery disease Mother   . Diabetes Mother   . Stroke Father     Social History Social History   Tobacco Use  . Smoking status: Former Research scientist (life sciences)  . Smokeless tobacco: Never Used  Substance Use Topics  . Alcohol use: Not Currently  . Drug use: Never     Allergies   Lisinopril   Review of Systems Review of Systems  Reason unable to perform ROS: See HPI as above.     Physical Exam Triage Vital Signs ED Triage Vitals [11/12/18 1019]  Enc Vitals Group     BP 122/77     Pulse Rate 72     Resp 18     Temp 97.7 F (36.5 C)     Temp Source Oral     SpO2 96 %     Weight      Height      Head Circumference      Peak Flow      Pain Score 0     Pain Loc      Pain Edu?      Excl. in [place]?    No data found.  Updated Vital Signs BP 122/77 (BP Location: Left Arm)   Pulse 72   Temp 97.7 F (36.5 C) (Oral)   Resp 18   SpO2 96%   Physical Exam Constitutional:      General: She is not in acute distress.    Appearance: She is well-developed. She is not ill-appearing, toxic-appearing or diaphoretic.  HENT:     Head: Normocephalic and atraumatic.     Right Ear: Tympanic membrane, ear canal and external ear normal. Tympanic membrane is not erythematous or bulging.     Left Ear: Tympanic membrane, ear canal and external ear normal. Tympanic membrane is not erythematous or bulging.     Nose: Rhinorrhea  present.     Right Sinus: Maxillary sinus tenderness present. No frontal sinus tenderness.     Left Sinus: Maxillary sinus tenderness present. No frontal sinus tenderness.     Mouth/Throat:     Mouth: Mucous membranes are moist.     Pharynx: Oropharynx is clear. Uvula midline.  Eyes:     Conjunctiva/sclera: Conjunctivae normal.  Pupils: Pupils are equal, round, and reactive to light.  Neck:     Musculoskeletal: Normal range of motion and neck supple.  Cardiovascular:     Rate and Rhythm: Normal rate and regular rhythm.     Heart sounds: Normal heart sounds. No murmur. No friction rub. No gallop.   Pulmonary:     Effort: Pulmonary effort is normal. No accessory muscle usage, prolonged expiration, respiratory distress or retractions.     Breath sounds: Normal breath sounds. No stridor, decreased air movement or transmitted upper airway sounds. No decreased breath sounds, wheezing, rhonchi or rales.  Skin:    General: Skin is warm and dry.  Neurological:     Mental Status: She is alert and oriented to person, place, and time.      UC Treatments / Results  Labs (all labs ordered are listed, but only abnormal results are displayed) Labs Reviewed - No data to display  EKG None  Radiology Dg Chest 2 View  Result Date: 11/12/2018 CLINICAL DATA:  Persistent cough.  Pneumonia for 2 months EXAM: CHEST - 2 VIEW COMPARISON:  08/27/2018 FINDINGS: Normal heart size. Single chamber pacer into the right ventricle. Right upper lobe airspace disease most consistent with pneumonia in this setting. It is also reassuring that the opacity is new from 3 months ago. Still, given the history of persistent symptoms close follow-up is recommended. IMPRESSION: Right upper lobe opacity consistent with pneumonia in the appropriate clinical setting. Followup PA and lateral chest X-ray is recommended in 3-4 weeks following trial of antibiotic therapy to ensure resolution and exclude underlying malignancy.  Electronically Signed   By: Monte Fantasia M.D.   On: 11/12/2018 11:18    Procedures Procedures (including critical care time)  Medications Ordered in UC Medications - No data to display  Initial Impression / Assessment and Plan / UC Course  I have reviewed the triage vital signs and the nursing notes.  Pertinent labs & imaging results that were available during my care of the patient were reviewed by me and considered in my medical decision making (see chart for details).    Case discussed with patient's primary care provider, Lavell Anchors, FNP.  Will start patient on dual therapy with Augmentin and doxycycline.  Will have patient recheck on INR in 3 days due to antibiotic use.  Symptomatic treatment discussed.  Patient to follow-up with PCP in 2 weeks for reevaluation.  Strict return precautions given.  Patient expresses understanding and agrees to plan.  Final Clinical Impressions(s) / UC Diagnoses   Final diagnoses:  Pneumonia of right upper lobe due to infectious organism [facility])    ED Prescriptions    Medication Sig Dispense Auth. Provider   amoxicillin-clavulanate (AUGMENTIN XR) 1000-62.5 MG 12 hr tablet Take 2 tablets by mouth 2 (two) times daily. 14 tablet Colton Engdahl V, PA-C   doxycycline (VIBRAMYCIN) 100 MG capsule Take 1 capsule (100 mg total) by mouth 2 (two) times daily. 14 capsule Carah Barrientes V, PA-C   benzonatate (TESSALON) 100 MG capsule Take 1 capsule (100 mg total) by mouth every 8 (eight) hours. 21 capsule Courtlynn Holloman V, PA-C   albuterol (PROVENTIL HFA;VENTOLIN HFA) 108 (90 Base) MCG/ACT inhaler Inhale 1-2 puffs into the lungs every 6 (six) hours as needed for wheezing or shortness of breath. 1 Inhaler Tobin Chad, PA-C 11/12/18 1309

## 2018-11-12 NOTE — ED Triage Notes (Signed)
Pt states had PNA back in decemeber with 2 rounds on antibotics. C/o cough, worse at night

## 2018-11-12 NOTE — Discharge Instructions (Signed)
Right upper lobe pneumonia.  Start Augmentin and doxycycline as directed.  Albuterol inhaler as needed.  Tessalon for cough.  Please follow-up with INR team for recheck of INR in 2 to 3 days.  Your primary care, Lavell Anchors, would like to see you either on the 16th or 17th.  Please make an appointment in follow-up.  If experiencing worsening symptoms, shortness of breath, chest pain, leg swelling, go to the emergency department for further evaluation.

## 2018-11-12 NOTE — Patient Instructions (Signed)
Increase dose to 1 tablet daily except 1.5 tablets each Wednesday.  Repeat INR in 3 weeks

## 2018-11-17 ENCOUNTER — Ambulatory Visit (INDEPENDENT_AMBULATORY_CARE_PROVIDER_SITE_OTHER): Payer: Medicare Other | Admitting: *Deleted

## 2018-11-17 DIAGNOSIS — Z7901 Long term (current) use of anticoagulants: Secondary | ICD-10-CM | POA: Diagnosis not present

## 2018-11-17 DIAGNOSIS — I253 Aneurysm of heart: Secondary | ICD-10-CM

## 2018-11-17 LAB — POCT INR: INR: 2.2 (ref 2.0–3.0)

## 2018-11-17 NOTE — Patient Instructions (Signed)
Description   Continue taking 1 tablet daily except 1.5 tablets each Wednesday.  Repeat INR in 2 weeks

## 2018-11-18 ENCOUNTER — Encounter: Payer: Self-pay | Admitting: Family Medicine

## 2018-11-20 ENCOUNTER — Other Ambulatory Visit: Payer: Self-pay

## 2018-11-20 ENCOUNTER — Ambulatory Visit (INDEPENDENT_AMBULATORY_CARE_PROVIDER_SITE_OTHER): Payer: Medicare Other | Admitting: *Deleted

## 2018-11-20 DIAGNOSIS — I5042 Chronic combined systolic (congestive) and diastolic (congestive) heart failure: Secondary | ICD-10-CM

## 2018-11-20 DIAGNOSIS — Z95810 Presence of automatic (implantable) cardiac defibrillator: Secondary | ICD-10-CM

## 2018-11-20 LAB — CUP PACEART REMOTE DEVICE CHECK
Battery Remaining Longevity: 144 mo
Battery Remaining Percentage: 100 %
Brady Statistic RV Percent Paced: 0 %
Date Time Interrogation Session: 20200312061100
HighPow Impedance: 69 Ohm
Implantable Pulse Generator Implant Date: 20170217
Lead Channel Impedance Value: 838 Ohm
Lead Channel Pacing Threshold Amplitude: 0.7 V
Lead Channel Pacing Threshold Pulse Width: 0.4 ms
Lead Channel Setting Pacing Pulse Width: 0.4 ms
Lead Channel Setting Sensing Sensitivity: 0.5 mV
MDC IDC SET LEADCHNL RV PACING AMPLITUDE: 2 V
Pulse Gen Serial Number: 213123

## 2018-11-24 ENCOUNTER — Ambulatory Visit (INDEPENDENT_AMBULATORY_CARE_PROVIDER_SITE_OTHER): Payer: Medicare Other

## 2018-11-24 ENCOUNTER — Other Ambulatory Visit: Payer: Self-pay

## 2018-11-24 ENCOUNTER — Ambulatory Visit (INDEPENDENT_AMBULATORY_CARE_PROVIDER_SITE_OTHER): Payer: Medicare Other | Admitting: Family Medicine

## 2018-11-24 ENCOUNTER — Telehealth: Payer: Self-pay | Admitting: Family Medicine

## 2018-11-24 ENCOUNTER — Encounter: Payer: Self-pay | Admitting: Family Medicine

## 2018-11-24 VITALS — BP 122/66 | HR 63 | Temp 97.7°F | Resp 17 | Ht 67.5 in | Wt 210.0 lb

## 2018-11-24 DIAGNOSIS — E1165 Type 2 diabetes mellitus with hyperglycemia: Secondary | ICD-10-CM

## 2018-11-24 DIAGNOSIS — E1151 Type 2 diabetes mellitus with diabetic peripheral angiopathy without gangrene: Secondary | ICD-10-CM | POA: Diagnosis not present

## 2018-11-24 DIAGNOSIS — Z7901 Long term (current) use of anticoagulants: Secondary | ICD-10-CM

## 2018-11-24 DIAGNOSIS — Z8701 Personal history of pneumonia (recurrent): Secondary | ICD-10-CM

## 2018-11-24 DIAGNOSIS — J181 Lobar pneumonia, unspecified organism: Secondary | ICD-10-CM | POA: Diagnosis not present

## 2018-11-24 DIAGNOSIS — J189 Pneumonia, unspecified organism: Secondary | ICD-10-CM

## 2018-11-24 DIAGNOSIS — IMO0002 Reserved for concepts with insufficient information to code with codable children: Secondary | ICD-10-CM

## 2018-11-24 DIAGNOSIS — R05 Cough: Secondary | ICD-10-CM | POA: Diagnosis not present

## 2018-11-24 LAB — GLUCOSE, POCT (MANUAL RESULT ENTRY): POC Glucose: 118 mg/dl — AB (ref 70–99)

## 2018-11-24 MED ORDER — AMOXICILLIN-POT CLAVULANATE 875-125 MG PO TABS: 1.0000 | ORAL_TABLET | Freq: Two times a day (BID) | ORAL | 0 refills | Status: DC

## 2018-11-24 NOTE — Telephone Encounter (Signed)
Called patient's insurance. As of 09/10/2016 they no longer require prior authorization for CT scans.

## 2018-11-24 NOTE — Patient Instructions (Signed)
Community-Acquired Pneumonia, Adult  Pneumonia is an infection of the lungs. It causes swelling in the airways of the lungs. Mucus and fluid may also build up inside the airways.  One type of pneumonia can happen while a person is in a hospital. A different type can happen when a person is not in a hospital (community-acquired pneumonia).   What are the causes?    This condition is caused by germs (viruses, bacteria, or fungi). Some types of germs can be passed from one person to another. This can happen when you breathe in droplets from the cough or sneeze of an infected person.  What increases the risk?  You are more likely to develop this condition if you:   Have a long-term (chronic) disease, such as:  ? Chronic obstructive pulmonary disease (COPD).  ? Asthma.  ? Cystic fibrosis.  ? Congestive heart failure.  ? Diabetes.  ? Kidney disease.   Have HIV.   Have sickle cell disease.   Have had your spleen removed.   Do not take good care of your teeth and mouth (poor dental hygiene).   Have a medical condition that increases the risk of breathing in droplets from your own mouth and nose.   Have a weakened body defense system (immune system).   Are a smoker.   Travel to areas where the germs that cause this illness are common.   Are around certain animals or the places they live.  What are the signs or symptoms?   A dry cough.   A wet (productive) cough.   Fever.   Sweating.   Chest pain. This often happens when breathing deeply or coughing.   Fast breathing or trouble breathing.   Shortness of breath.   Shaking chills.   Feeling tired (fatigue).   Muscle aches.  How is this treated?  Treatment for this condition depends on many things. Most adults can be treated at home. In some cases, treatment must happen in a hospital. Treatment may include:   Medicines given by mouth or through an IV tube.   Being given extra oxygen.   Respiratory therapy.  In rare cases, treatment for very bad pneumonia  may include:   Using a machine to help you breathe.   Having a procedure to remove fluid from around your lungs.  Follow these instructions at home:  Medicines   Take over-the-counter and prescription medicines only as told by your doctor.  ? Only take cough medicine if you are losing sleep.   If you were prescribed an antibiotic medicine, take it as told by your doctor. Do not stop taking the antibiotic even if you start to feel better.  General instructions     Sleep with your head and neck raised (elevated). You can do this by sleeping in a recliner or by putting a few pillows under your head.   Rest as needed. Get at least 8 hours of sleep each night.   Drink enough water to keep your pee (urine) pale yellow.   Eat a healthy diet that includes plenty of vegetables, fruits, whole grains, low-fat dairy products, and lean protein.   Do not use any products that contain nicotine or tobacco. These include cigarettes, e-cigarettes, and chewing tobacco. If you need help quitting, ask your doctor.   Keep all follow-up visits as told by your doctor. This is important.  How is this prevented?  A shot (vaccine) can help prevent pneumonia. Shots are often suggested for:   People   older than 72 years of age.   People older than 72 years of age who:  ? Are having cancer treatment.  ? Have long-term (chronic) lung disease.  ? Have problems with their body's defense system.  You may also prevent pneumonia if you take these actions:   Get the flu (influenza) shot every year.   Go to the dentist as often as told.   Wash your hands often. If you cannot use soap and water, use hand sanitizer.  Contact a doctor if:   You have a fever.   You lose sleep because your cough medicine does not help.  Get help right away if:   You are short of breath and it gets worse.   You have more chest pain.   Your sickness gets worse. This is very serious if:  ? You are an older adult.  ? Your body's defense system is weak.   You  cough up blood.  Summary   Pneumonia is an infection of the lungs.   Most adults can be treated at home. Some will need treatment in a hospital.   Drink enough water to keep your pee pale yellow.   Get at least 8 hours of sleep each night.  This information is not intended to replace advice given to you by your health care provider. Make sure you discuss any questions you have with your health care provider.  Document Released: 02/13/2008 Document Revised: 04/24/2018 Document Reviewed: 04/24/2018  Elsevier Interactive Patient Education  2019 Elsevier Inc[employer]

## 2018-11-24 NOTE — Telephone Encounter (Signed)
notified patient

## 2018-11-24 NOTE — Progress Notes (Signed)
Patient ID: Desiree Harrison, female    DOB: [birth year], 73 y.o.   MRN: [id number]  PCP: Scot Jun, FNP  Chief Complaint  Patient presents with  . Follow-up    UC 3/4: pneumonia R upper lobe    Subjective:  HPI  Desiree Harrison chronic conditions include: Chronic combined systolic and diastolic heart failure ([place]); Diabetes mellitus ([place]); Essential hypertension; Ventricular aneurysm; Long term current use of anticoagulant therapy; Hypothyroidism; Hyperlipidemia; Pneumonia of both lungs due to infectious organism; ICD (implantable cardioverter-defibrillator) in place; and Long term (current) use of anticoagulants..  Pneumonia follow-up: Desiree Harrison is a 73 y.o. female , former smoker, cardiovascualr disease, pacemaker (ICD)., presents for evaluation of recent diagnosis of pneumonia of right upper lobe. Patient was previously diagnosed with pneumonia in December 2019, which completely resolved evidence by a negative chest x-ray. Most recent pneumonia diagnosis was treated with dual anabiotics amoxicillin and doxycyline for which she has completed. Today, she complains of persistent cough. Cough occasionally productive of green mucus. No chest tenderness. Minimal shortness, continues to become fatigued with some activity.  Diabetes follow-up: Last A1C 13.4 3 months prior. She reports insulin adherence as prescribed. Home blood sugar readings have ranged <200. No hypoglycemia. She doesn't have her meter present today. No recent changes in weight. Denies chest pain, visual changes, or neuropathic pain. Current regimen include: Humalog 50 units twice daily and regular insulin mealtime -10 units TID. ACE prescribed. No statin. Social History   Socioeconomic History  . Marital status: Widowed    Spouse name: Not on file  . Number of children: Not on file  . Years of education: Not on file  . Highest education level: Not on file  Occupational History  . Not on file  Social Needs  .  Financial resource strain: Not on file  . Food insecurity:    Worry: Not on file    Inability: Not on file  . Transportation needs:    Medical: Not on file    Non-medical: Not on file  Tobacco Use  . Smoking status: Former Research scientist (life sciences)  . Smokeless tobacco: Never Used  Substance and Sexual Activity  . Alcohol use: Not Currently  . Drug use: Never  . Sexual activity: Not on file  Lifestyle  . Physical activity:    Days per week: Not on file    Minutes per session: Not on file  . Stress: Not on file  Relationships  . Social connections:    Talks on phone: Not on file    Gets together: Not on file    Attends religious service: Not on file    Active member of club or organization: Not on file    Attends meetings of clubs or organizations: Not on file    Relationship status: Not on file  . Intimate partner violence:    Fear of current or ex partner: Not on file    Emotionally abused: Not on file    Physically abused: Not on file    Forced sexual activity: Not on file  Other Topics Concern  . Not on file  Social History Narrative  . Not on file    Family History  Problem Relation Age of Onset  . Breast cancer Mother   . Coronary artery disease Mother   . Diabetes Mother   . Stroke Father    Review of Systems Pertinent negatives listed in HPI  Patient Active Problem List   Diagnosis Date Noted  . Long term (current) use of  anticoagulants 08/27/2018  . Diabetes mellitus ([place]) 08/22/2018  . Essential hypertension 08/22/2018  . Ventricular aneurysm 08/22/2018  . Long term current use of anticoagulant therapy 08/22/2018  . Hypothyroidism 08/22/2018  . Hyperlipidemia 08/22/2018  . Pneumonia of both lungs due to infectious organism 08/22/2018  . ICD (implantable cardioverter-defibrillator) in place 08/22/2018  . Chronic combined systolic and diastolic heart failure ([place]) 08/21/2018    Allergies  Allergen Reactions  . Lisinopril Cough    Prior to Admission medications    Medication Sig Start Date End Date Taking? Authorizing Provider  albuterol (PROVENTIL HFA;VENTOLIN HFA) 108 (90 Base) MCG/ACT inhaler Inhale 1-2 puffs into the lungs every 6 (six) hours as needed for wheezing or shortness of breath. 11/12/18  Yes Yu, Amy V, PA-C  amLODipine (NORVASC) 5 MG tablet TAKE 1 TABLET BY MOUTH EVERY DAY 01/13/18  Yes [provider]  aspirin EC 81 MG tablet TAKE 1 TABLET BY MOUTH EVERY DAY 09/16/15  Yes [provider]  atorvastatin (LIPITOR) 80 MG tablet TAKE ONE TABLET BY MOUTH DAILY AT 6 PM 01/27/18  Yes [provider]  carvedilol (COREG) 25 MG tablet Take 25 mg by mouth 2 (two) times daily with a meal.  08/27/17  Yes [provider]  gabapentin (NEURONTIN) 100 MG capsule Take 2 capsules (200 mg total) by mouth 2 (two) times daily. 10/16/18  Yes Scot Jun, FNP  insulin lispro (HUMALOG) 100 UNIT/ML injection Inject 0.1 mLs (10 Units total) into the skin 3 (three) times daily before meals. 10/16/18  Yes Scot Jun, FNP  insulin lispro protamine-lispro (HUMALOG 50/50 MIX) (50-50) 100 UNIT/ML SUSP injection Inject 0.5 mLs (50 Units total) into the skin 2 (two) times daily before a meal. 10/16/18  Yes Scot Jun, FNP  Insulin Pen Needle (NOVOFINE) 30G X 8 MM MISC Inject 10 each into the skin as needed. 10/16/18  Yes Scot Jun, FNP  levothyroxine (SYNTHROID, LEVOTHROID) 50 MCG tablet Take 1 tablet (50 mcg total) by mouth daily. 10/03/18  Yes Scot Jun, FNP  losartan (COZAAR) 100 MG tablet Take 1 tablet (100 mg total) by mouth daily. 10/02/18 12/31/18 Yes Croitoru, Mihai, MD  warfarin (COUMADIN) 3 MG tablet TAKE 1 TABLET BY MOUTH EVERY DAY AT 6 PM 10/14/17  Yes [provider]    Past Medical, Surgical Family and Social History reviewed and updated.    Objective:   Today's Vitals   11/24/18 1056  BP: 122/66  Pulse: 63  Resp: 17  Temp: 97.7 F (36.5 C)  TempSrc: Oral  SpO2: 96%  Weight: 210 lb  (95.3 kg)  Height: 5' 7.5" (1.715 m)    Wt Readings from Last 3 Encounters:  11/24/18 210 lb (95.3 kg)  10/16/18 205 lb 6.4 oz (93.2 kg)  08/21/18 206 lb (93.4 kg)   Physical Exam General appearance: alert, well developed, well nourished, cooperative and in no distress Head: Normocephalic, without obvious abnormality, atraumatic Respiratory: Respirations even and unlabored,lung sounds diminished. No wheezing. Rhonchus cough present. Heart: rate and rhythm normal. No gallop or murmurs noted on exam  Abdomen: BS +, no distention, no rebound tenderness, or no mass Extremities: No gross deformities Skin: Skin color, texture, turgor normal. No rashes seen  Psych: Appropriate mood and affect. Neurologic: Mental status: Alert, oriented to person, place, and time, thought content appropriate.  Lab Results  Component Value Date   POCGLU 134 (A) 10/16/2018   POCGLU 215 (A) 08/19/2018    Lab Results  Component Value  Date   HGBA1C 13.4 (H) 08/19/2018   Assessment & Plan:  1. History of recent pneumonia Pneumonia December 2019 and newly diagnosed 11/12/18 Repeating  DG Chest 2 View; Future and checking  CBC with Differential  2. Uncontrolled type 2 diabetes mellitus with peripheral circulatory disorder ([place]) Most recent A1C 13.4. Reports recent home readings less than 200. Checking:- Hemoglobin A1C, will adjust medications based on A1C - Glucose (CBG)   3. Long term current use of anticoagulant therapy -Followed by Coumadin clinic    4. Unresolved pneumonia, recent imaging shows minimally resolved pneumonia. Patient is being referred to pulmonology for further evaluation of respiratory function and I will order a non-emergent  CT Chest W Contrast; Future, Patient is a former smoker, without history of COPD.  Dg Chest 2 View Result Date: 11/24/2018 CLINICAL DATA:  Recurrent pneumonia.  Follow-up EXAM: CHEST - 2 VIEW COMPARISON:  11/12/2018 FINDINGS: Minimal improvement in right upper  lobe opacity. Given this opacity is new from December 2019 it is again most likely to be inflammatory. No edema, effusion, or pneumothorax. Mild cardiomegaly. Right ventricular ICD lead. IMPRESSION: Minimal improvement in right upper lobe opacity. Given only 2 weeks have passed, recommend additional chest x-ray follow-up in 3 to 4 weeks. If presentation is atypical for pneumonia, suggest chest CT. Electronically Signed   By: Monte Fantasia M.D.   On: 11/24/2018 11:24   -The patient was given clear instructions to go to ER or return to medical center if symptoms do not improve, worsen or new problems develop. The patient verbalized understanding.    Molli Barrows, FNP [facility] [address], [place] [phone]: [phone]

## 2018-11-24 NOTE — Telephone Encounter (Signed)
Patient is scheduled for her CT scan at 3:40 pm on 11/28/2018 at [facility]. Patient can only have liquids after 11:30 AM.  Can you notify her of her appointment? Thanks.

## 2018-11-24 NOTE — Telephone Encounter (Signed)
CT chest of without contrast. Prefers  [facility]

## 2018-11-26 ENCOUNTER — Encounter: Payer: Self-pay | Admitting: Family Medicine

## 2018-11-26 ENCOUNTER — Other Ambulatory Visit: Payer: Self-pay | Admitting: Family Medicine

## 2018-11-26 LAB — CBC WITH DIFFERENTIAL/PLATELET
Basophils Absolute: 0.1 10*3/uL (ref 0.0–0.2)
Basos: 1 %
EOS (ABSOLUTE): 0.2 10*3/uL (ref 0.0–0.4)
Eos: 3 %
HEMATOCRIT: 42.1 % (ref 34.0–46.6)
Hemoglobin: 13.7 g/dL (ref 11.1–15.9)
Immature Grans (Abs): 0 10*3/uL (ref 0.0–0.1)
Immature Granulocytes: 0 %
Lymphocytes Absolute: 2.6 10*3/uL (ref 0.7–3.1)
Lymphs: 38 %
MCH: 26.8 pg (ref 26.6–33.0)
MCHC: 32.5 g/dL (ref 31.5–35.7)
MCV: 82 fL (ref 79–97)
MONOS ABS: 0.8 10*3/uL (ref 0.1–0.9)
Monocytes: 11 %
Neutrophils Absolute: 3.3 10*3/uL (ref 1.4–7.0)
Neutrophils: 47 %
Platelets: 223 10*3/uL (ref 150–450)
RBC: 5.12 x10E6/uL (ref 3.77–5.28)
RDW: 14.6 % (ref 11.7–15.4)
WBC: 6.9 10*3/uL (ref 3.4–10.8)

## 2018-11-26 LAB — HEMOGLOBIN A1C
Est. average glucose Bld gHb Est-mCnc: 212 mg/dL
Hgb A1c MFr Bld: 9 % — ABNORMAL HIGH (ref 4.8–5.6)

## 2018-11-26 LAB — SPECIMEN STATUS REPORT

## 2018-11-26 NOTE — Telephone Encounter (Signed)
Contact patient to advise recent A1C is 9.0 improved however remains not at goal of 7.0.  I would like to increase long acting insulin Humalog mix 50/50, increase to 60 units twice daily. Changes made in system and update sent to pharmacy

## 2018-11-27 ENCOUNTER — Encounter: Payer: Self-pay | Admitting: Cardiology

## 2018-11-27 NOTE — Progress Notes (Signed)
Remote ICD transmission.   

## 2018-11-28 ENCOUNTER — Ambulatory Visit (HOSPITAL_COMMUNITY): Payer: Medicare Other

## 2018-12-01 ENCOUNTER — Telehealth: Payer: Self-pay

## 2018-12-01 MED ORDER — INSULIN LISPRO PROT & LISPRO (50-50 MIX) 100 UNIT/ML ~~LOC~~ SUSP: 60.0000 [IU] | Freq: Two times a day (BID) | SUBCUTANEOUS | 3 refills | Status: DC

## 2018-12-01 NOTE — Telephone Encounter (Signed)
1. Do you currently have a fever? NO(yes = cancel and refer to pcp for e-visit) 2. Have you recently travelled on a cruise, internationally, or to [place], [place], [place], [place], [place], or [place], [place] [employer]) ? NO (yes = cancel, stay home, monitor symptoms, and contact pcp or initiate e-visit if symptoms develop) 3. Have you been in contact with someone that is currently pending confirmation of Covid19 testing or has been confirmed to have the [place] virus?  NO (yes = cancel, stay home, away from tested individual, monitor symptoms, and contact pcp or initiate e-visit if symptoms develop) 4. Are you currently experiencing fatigue or cough? YES (yes = pt should be prepared to have a mask placed at the time of their visit).  Pt. Advised that we are restricting visitors at this time and anyone present in the vehicle should meet the above criteria as well. Advised that visit will be at curbside for finger stick ONLY and will receive call with instructions. Pt also advised to please bring own pen for signature of arrival document.

## 2018-12-01 NOTE — Telephone Encounter (Signed)
Patient notified of lab results & recommendations. Repeated back medication changes correctly. Rx released to pharmacy on file.

## 2018-12-03 ENCOUNTER — Ambulatory Visit (INDEPENDENT_AMBULATORY_CARE_PROVIDER_SITE_OTHER): Payer: Medicare Other | Admitting: Pharmacist

## 2018-12-03 ENCOUNTER — Other Ambulatory Visit: Payer: Self-pay

## 2018-12-03 DIAGNOSIS — I253 Aneurysm of heart: Secondary | ICD-10-CM | POA: Diagnosis not present

## 2018-12-03 DIAGNOSIS — Z7901 Long term (current) use of anticoagulants: Secondary | ICD-10-CM | POA: Diagnosis not present

## 2018-12-03 LAB — POCT INR: INR: 1.9 — AB (ref 2.0–3.0)

## 2018-12-03 NOTE — Patient Instructions (Signed)
Description   Take 2 tablets today, then start taking 1 tablet daily except 1.5 tablets each Sunday and Wednesday.  Repeat INR in 3 weeks.

## 2018-12-08 ENCOUNTER — Other Ambulatory Visit: Payer: Self-pay | Admitting: Family Medicine

## 2018-12-08 ENCOUNTER — Telehealth: Payer: Self-pay | Admitting: Family Medicine

## 2018-12-08 MED ORDER — PROMETHAZINE-DM 6.25-15 MG/5ML PO SYRP: 5.0000 mL | ORAL_SOLUTION | Freq: Four times a day (QID) | ORAL | 1 refills | Status: DC | PRN

## 2018-12-08 NOTE — Progress Notes (Signed)
Recommend continued management of cough with benzonatate and I will send a prescription to her pharmacy for cough syrup she can take twice per day to help with sleep. I will see her back in office after CT scan schedule later part of April.

## 2018-12-08 NOTE — Progress Notes (Signed)
Patient notified of this information. Expressed understanding. Cancelled appointment with provider on 12/24/2018. Patient already had an appointment scheduled with provider on 01/14/2019.

## 2018-12-08 NOTE — Telephone Encounter (Signed)
Patient called and stated that it seems like every time she stops taking the antibiotics her hacking cough returns and she would like some advice on what to do next.   Patient also commented that she has an appointment on 12/24/2018 but her scan isn't until 12/26/2018, she would like to know if she should push her appointment back. Please follow up.

## 2018-12-08 NOTE — Telephone Encounter (Signed)
Please advise 

## 2018-12-17 NOTE — Telephone Encounter (Signed)
[name] called in requesting to speak to the nurse, she states that she has some concerns about her mother. Please follow up.

## 2018-12-17 NOTE — Telephone Encounter (Signed)
Returned Desiree Harrison. She wanted to know why her mom was having the CT scan done & if she could have it done sooner. Colletta Maryland is on patient's DPR.  I let Colletta Maryland know that CT was ordered as recommended by radiology after patient's repeat chest xray didn't show much improvement. I also let her know that it was not likely that the CT could be pushed up since it was originally scheduled for 11/28/2018 & had to be pushed back due to COVID 19 restrictions.

## 2018-12-23 ENCOUNTER — Telehealth: Payer: Self-pay

## 2018-12-23 ENCOUNTER — Other Ambulatory Visit: Payer: Self-pay | Admitting: Cardiovascular Disease

## 2018-12-23 ENCOUNTER — Telehealth (HOSPITAL_COMMUNITY): Payer: Self-pay

## 2018-12-23 MED ORDER — WARFARIN SODIUM 3 MG PO TABS: ORAL_TABLET | ORAL | 0 refills | Status: DC

## 2018-12-23 NOTE — Telephone Encounter (Signed)
lmom for prescreen  

## 2018-12-23 NOTE — Telephone Encounter (Signed)
-----   Message from Scot Jun, Addis sent at 12/23/2018  1:12 PM EDT ----- Regarding: RE: Reschedule due to [place] It's ok to reschedule ----- Message ----- From: Danielle Dess Sent: 12/23/2018   9:50 AM EDT To: Scot Jun, FNP Subject: Reschedule due to COVID                        Morning,   This patient is scheduled for a ct chest on Friday here at [facility]. Due to COVID restrictions, we have been asked to reschedule all outpatients to after June 1st unless the provider feels like the exam is urgent. Please let me know if this is ok to reschedule. Thanks,  Caryl Pina  IR scheduler

## 2018-12-23 NOTE — Telephone Encounter (Signed)
Ok to reschedule ct chest per ordering physician. AW

## 2018-12-23 NOTE — Telephone Encounter (Signed)
1. Do you currently have a fever? no (yes = cancel and refer to pcp for e-visit) 2. Have you recently travelled on a cruise, internationally, or to [place], [place], [place], [place], [place], or [place], [place] [employer]) ? no (yes = cancel, stay home, monitor symptoms, and contact pcp or initiate e-visit if symptoms develop) 3. Have you been in contact with someone that is currently pending confirmation of Covid19 testing or has been confirmed to have the [place] virus?  no (yes = cancel, stay home, away from tested individual, monitor symptoms, and contact pcp or initiate e-visit if symptoms develop) 4. Are you currently experiencing fatigue or cough? Yes-from allergies (yes = pt should be prepared to have a mask placed at the time of their visit).  Pt. Advised that we are restricting visitors at this time and anyone present in the vehicle should meet the above criteria as well. Advised that visit will be at curbside for finger stick ONLY and will receive call with instructions. Pt also advised to please bring own pen for signature of arrival document.

## 2018-12-23 NOTE — Telephone Encounter (Signed)
 *  STAT* If patient is at the pharmacy, call can be transferred to refill team.   1. Which medications need to be refilled? (please list name of each medication and dose if known) warfarin (COUMADIN) 3 MG tablet  2. Which pharmacy/location (including street and city if local pharmacy) is medication to be sent to? [facility]  3. Do they need a 30 day or 90 day supply? [place]

## 2018-12-24 ENCOUNTER — Ambulatory Visit (INDEPENDENT_AMBULATORY_CARE_PROVIDER_SITE_OTHER): Payer: Medicare Other | Admitting: *Deleted

## 2018-12-24 ENCOUNTER — Ambulatory Visit: Payer: Medicare Other | Admitting: Family Medicine

## 2018-12-24 ENCOUNTER — Other Ambulatory Visit: Payer: Self-pay

## 2018-12-24 DIAGNOSIS — I253 Aneurysm of heart: Secondary | ICD-10-CM

## 2018-12-24 DIAGNOSIS — Z7901 Long term (current) use of anticoagulants: Secondary | ICD-10-CM

## 2018-12-24 LAB — POCT INR: INR: 1.6 — AB (ref 2.0–3.0)

## 2018-12-24 NOTE — Patient Instructions (Signed)
Description   Spoke with pt and instructed pt to take 2 tablets today, then start taking 1 tablet daily except 1.5 tablets each Sunday, Wednesday, and Fridays.  Repeat INR in 3 weeks.

## 2018-12-26 ENCOUNTER — Ambulatory Visit (HOSPITAL_COMMUNITY): Payer: Medicare Other

## 2018-12-29 ENCOUNTER — Other Ambulatory Visit: Payer: Self-pay

## 2018-12-29 ENCOUNTER — Telehealth: Payer: Self-pay | Admitting: Family Medicine

## 2018-12-29 ENCOUNTER — Other Ambulatory Visit: Payer: Self-pay | Admitting: Family Medicine

## 2018-12-29 DIAGNOSIS — I10 Essential (primary) hypertension: Secondary | ICD-10-CM

## 2018-12-29 NOTE — Telephone Encounter (Signed)
Desiree Harrison,  Notify patient's daughter that the CT for [name] is scheduled for 02/12/19 due to [place] all non-emergent studies have been pushed back. If patient is experiencing worsening of symptoms related to diagnosis of pneumonia from last month, my recommendation would be to have [name] evaluated at the emergency department to avoid delays in treatment. If patient is stable (as she was when last seen by me in office), and given that patient is high risk for COVID due to comorbid conditions, I would recommend waiting until June 4th CT study.

## 2018-12-29 NOTE — Telephone Encounter (Signed)
Patients daughter called stating she would like you to know what we can do to get the CT scanned sooner

## 2018-12-29 NOTE — Telephone Encounter (Signed)
Left voicemail to call back on Stephanie's phone.

## 2018-12-30 ENCOUNTER — Encounter: Payer: Self-pay | Admitting: Internal Medicine

## 2018-12-30 ENCOUNTER — Other Ambulatory Visit: Payer: Self-pay

## 2018-12-30 ENCOUNTER — Ambulatory Visit (INDEPENDENT_AMBULATORY_CARE_PROVIDER_SITE_OTHER): Payer: Medicare Other | Admitting: Internal Medicine

## 2018-12-30 VITALS — BP 114/86 | HR 75 | Temp 98.3°F | Ht 67.5 in | Wt 208.6 lb

## 2018-12-30 DIAGNOSIS — R058 Other specified cough: Secondary | ICD-10-CM

## 2018-12-30 DIAGNOSIS — R05 Cough: Secondary | ICD-10-CM | POA: Diagnosis not present

## 2018-12-30 DIAGNOSIS — R918 Other nonspecific abnormal finding of lung field: Secondary | ICD-10-CM | POA: Insufficient documentation

## 2018-12-30 MED ORDER — OMEPRAZOLE 20 MG PO CPDR: DELAYED_RELEASE_CAPSULE | ORAL | Status: DC

## 2018-12-30 MED ORDER — PREDNISONE 10 MG PO TABS: ORAL_TABLET | ORAL | 0 refills | Status: DC

## 2018-12-30 MED ORDER — LEVOFLOXACIN 500 MG PO TABS: 500.0000 mg | ORAL_TABLET | Freq: Every day | ORAL | 0 refills | Status: DC

## 2018-12-30 NOTE — Telephone Encounter (Signed)
Patient was seen by Pulmonology today & concerns were addressed.

## 2018-12-30 NOTE — Patient Instructions (Addendum)
Prednisone 10 mg take  4 each am x 2 days,   2 each am x 2 days,  1 each am x 2 days and stop   Levaquin 500 mg daily x 10 days  Prilosec 20 mg otc Take 30- 60 min before your first and last meals of the day   GERD (REFLUX)  is an extremely common cause of respiratory symptoms just like yours , many times with no obvious heartburn at all.    It can be treated with medication, but also with lifestyle changes including elevation of the head of your bed (ideally with 6 -8inch blocks under the headboard of your bed),  Smoking cessation, avoidance of late meals, excessive alcohol, and avoid fatty foods, chocolate, peppermint, colas, red wine, and acidic juices such as orange juice.  NO MINT OR MENTHOL PRODUCTS SO NO COUGH DROPS  USE SUGARLESS CANDY INSTEAD (Jolley ranchers or Stover's or Life Savers) or even ice chips will also do - the key is to swallow to prevent all throat clearing. NO OIL BASED VITAMINS - use powdered substitutes.  Avoid fish oil when coughing.     Please remember to go to the lab department   for your tests - we will call you with the results when they are available.      Please schedule a follow up office visit in 2 weeks, sooner if needed  with all medications /inhalers/ solutions in hand so we can verify exactly what you are taking. This includes all medications from all doctors and over the counters cxr on return / recheck esr/bnp also

## 2018-12-30 NOTE — Assessment & Plan Note (Signed)
Ace cough 2017, resolved  Onset recurrent cough Nov 2020  Some of her coughing is clearly not related to lung dz but more typical of Upper airway cough syndrome (previously labeled PNDS),  is so named because it's frequently impossible to sort out how much is  CR/sinusitis with freq throat clearing (which can be related to primary GERD)   vs  causing  secondary (" extra esophageal")  GERD from wide swings in gastric pressure that occur with throat clearing, often  promoting self use of mint and menthol lozenges that reduce the lower esophageal sphincter tone and exacerbate the problem further in a cyclical fashion.   These are the same pts (now being labeled as having "irritable larynx syndrome" by some cough centers) who not infrequently have a history of having failed to tolerate ace inhibitors,  dry powder inhalers or biphosphonates or report having atypical/extraesophageal reflux symptoms that don't respond to standard doses of PPI  and are easily confused as having aecopd or asthma flares by even experienced allergists/ pulmonologists (myself included).   >>> short term rx with ppi ac bid and diet/ reviewed    Total time devoted to counseling  > 50 % of initial 60 min office visit:  review case with pt/ discussion of options/alternatives/ personally creating written customized instructions  in presence of pt  then going over those specific  Instructions directly with the pt including how to use all of the meds but in particular covering each new medication in detail and the difference between the maintenance= "automatic" meds and the prns using an action plan format for the latter (If this problem/symptom => do that organization reading Left to right).  Please see AVS from this visit for a full list of these instructions which I personally wrote for this pt and  are unique to this visit.

## 2018-12-30 NOTE — Assessment & Plan Note (Addendum)
Onset of cough Nov 2019 - ESR 12/30/2018 33 - Gold quant TB 12/30/2018 neg   ddx for pulmonary infiltrates diffuse   Miscellaneous:Alv microlithiasis, alv proteinosis, asp, bronchiectais, BOOP  > ESR not impressive but cover for now just with   prednisone x 6 days then regroup in 2 weeks with cxr ARDS/ AIP Occupational dz/ HSP Neoplasm Infection seems unlikely though still coughing up slt green mucus so rex levaquin x 10 days to cover bronchiectasis flare also  Drug None of the usual suspects listed  Pulmonary emboli, Protein disorders Edema> no orthopnea/ pnd or LE edema/ no s3 > should do bnp or return if still sob  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

## 2018-12-30 NOTE — Progress Notes (Signed)
Desiree Harrison, female    DOB: 04-16-1946,   MRN: [id number]   Brief patient profile:  36 yobf never regular smoker with no respiratory problems until her mid 6s with chronic rhinitis assoc itchy/ sneezing/ [place]  referred to [facility] allergy > pos grass rec otc antihistamines only/ no inhalers >>   seemed to work and maintained on it since then dx 2017 chf rx with lisinopril caused a cough but  100% resolved off it  indolent onset cough while on losartan in Nov 2019 while in [place] > UC at [place] dx bilateral lower lobe pna per report rx levaquin /inhaler > much better but still cough so cxr done 08/27/18 ? Bilateral perihilar pna >   rx augmentin and then doxycline/amoxicillin with no improvement in cough abd cxr showing ? RUL as dz  So referred to pulmonary clinic 12/30/2018 by Dr   Kenton Kingfisher     History of Present Illness  12/30/2018  Pulmonary/ 1st office eval/[name]  Chief Complaint  Patient presents with  . Pulmonary Consult    referred by Dr. Kenton Kingfisher (PCP) for recurrent pneumonia sinc Nov 2019; having nonproductive cough w/ light green mucus, SOB when outside, mild wheezing, chest tightness when laying down at night, denies fever/chills/sweats  Dyspnea:  MMRC3 = can't walk 100 yards even at a slow pace at a flat grade s stopping due to sob   Cough: first thing in am / min discolored  Sleep: worse wheezing immediately at hs . Sitting up helps dsome  SABA use: helps some  Clear nasal congestion/ watery discharge not purlent   No obvious day to day or daytime variability or assoc excess/ purulent sputum or mucus plugs or hemoptysis or cp  ,  or overt   hb symptoms.    Also denies any obvious fluctuation of symptoms with weather or environmental changes or other aggravating or alleviating factors except as outlined above   No unusual exposure hx or h/o childhood pna/ asthma or knowledge of premature birth.  Current Allergies, Complete Past  Medical History, Past Surgical History, Family History, and Social History were reviewed in [employer] record.  ROS  The following are not active complaints unless bolded Hoarseness, sore throat, dysphagia, dental problems, itching, sneezing,  nasal congestion or discharge of excess mucus or purulent secretions, ear ache,   fever, chills, sweats, unintended wt loss or wt gain, classically pleuritic or exertional cp,  orthopnea pnd or arm/hand swelling  or leg swelling, presyncope, palpitations, abdominal pain, anorexia, nausea, vomiting, diarrhea  or change in bowel habits or change in bladder habits, change in stools or change in urine, dysuria, hematuria,  rash, arthralgias, visual complaints, headache, numbness, weakness or ataxia or problems with walking or coordination,  change in mood or  memory.               Past Medical History:  Diagnosis Date  . Acute systolic heart failure ([place])   . CAD in native artery   . Cardiomyopathy ([place])   . Long term (current) use of anticoagulants   . S/P internal cardiac defibrillator procedure    placed 10/28/15  . STEMI (ST elevation myocardial infarction) ([place])   . Type 2 diabetes mellitus, with long-term current use of insulin ([place])   . Ventricular aneurysm     Outpatient Medications Prior to Visit  Medication Sig Dispense Refill  . albuterol (PROVENTIL HFA;VENTOLIN HFA) 108 (90 Base) MCG/ACT inhaler Inhale  1-2 puffs into the lungs every 6 (six) hours as needed for wheezing or shortness of breath. 1 Inhaler 0  . amLODipine (NORVASC) 5 MG tablet TAKE 1 TABLET BY MOUTH EVERY DAY    . aspirin EC 81 MG tablet TAKE 1 TABLET BY MOUTH EVERY DAY    . atorvastatin (LIPITOR) 80 MG tablet TAKE ONE TABLET BY MOUTH DAILY AT 6 PM    . carvedilol (COREG) 25 MG tablet Take 25 mg by mouth 2 (two) times daily with a meal.     . gabapentin (NEURONTIN) 100 MG capsule TAKE 2 CAPSULES BY MOUTH 2 TIMES DAILY 60 capsule 2  . insulin lispro  (HUMALOG) 100 UNIT/ML injection Inject 0.1 mLs (10 Units total) into the skin 3 (three) times daily before meals. 10 mL 5  . insulin lispro protamine-lispro (HUMALOG 50/50 MIX) (50-50) 100 UNIT/ML SUSP injection Inject 0.6 mLs (60 Units total) into the skin 2 (two) times daily before a meal. 40 mL 3  . Insulin Pen Needle (NOVOFINE) 30G X 8 MM MISC Inject 10 each into the skin as needed. 200 each 3  . levothyroxine (SYNTHROID, LEVOTHROID) 50 MCG tablet Take 1 tablet (50 mcg total) by mouth daily. 90 tablet 1  . promethazine-dextromethorphan (PROMETHAZINE-DM) 6.25-15 MG/5ML syrup Take 5 mLs by mouth 4 (four) times daily as needed for cough. 118 mL 1  . warfarin (COUMADIN) 3 MG tablet TAKE 1 TABLET BY MOUTH EVERY DAY AT 6 PM 90 tablet 0  . losartan (COZAAR) 100 MG tablet Take 1 tablet (100 mg total) by mouth daily. (Patient not taking: Reported on 12/30/2018) 90 tablet 3  .           Objective:     BP 114/86 (BP Location: Left Arm, Cuff Size: Normal)   Pulse 75   Temp 98.3 F (36.8 C) (Oral)   Ht 5' 7.5" (1.715 m)   Wt 208 lb 9.6 oz (94.6 kg)   SpO2 98%   BMI 32.19 kg/m   SpO2: 98 %  RA  Very pleasant amb bf nad  HEENT:  Upper and lower partial dentures/ nl  turbinates bilaterally, and oropharynx. Nl external ear canals without cough reflex   NECK :  without JVD/Nodes/TM/ nl carotid upstrokes bilaterally   LUNGS: no acc muscle use,  Nl contour chest which is clear to A and P bilaterally without cough on insp or exp maneuvers   CV:  RRR  no s3 or murmur or increase in P2, and no edema   ABD:  soft and nontender with nl inspiratory excursion in the supine position. No bruits or organomegaly appreciated, bowel sounds nl  MS:  Nl gait/ ext warm without deformities, calf tenderness, cyanosis or clubbing No obvious joint restrictions   SKIN: warm and dry without lesions    NEURO:  alert, approp, nl sensorium with  no motor or cerebellar deficits apparent.     I personally  reviewed images and agree with radiology impression as follows:  CXR:   11/24/2018  Minimal improvement in right upper lobe opacity   Labs ordered 12/30/2018  Allergy profile / Quant TB    Labs ordered/ reviewed:      Chemistry      Component Value Date/Time   NA 144 12/30/2018 1543   K 4.3 12/30/2018 1543   CL 108 12/30/2018 1543   CO2 30 12/30/2018 1543   BUN 22 12/30/2018 1543   CREATININE 1.10 (H) 12/30/2018 1543      Component Value  Date/Time   CALCIUM 9.6 12/30/2018 1543        Lab Results  Component Value Date   WBC 7.7 12/30/2018   HGB 14.0 12/30/2018   HCT 42.4 12/30/2018   MCV 80.5 12/30/2018   PLT 237 12/30/2018       EOS                                                               131                                    12/30/2018      Lab Results  Component Value Date   TSH 4.120 08/19/2018         Lab Results  Component Value Date   ESRSEDRATE 33 (H) 12/30/2018             Assessment   Pulmonary infiltrates on CXR Onset of cough Nov 2019 - ESR 12/30/2018 33 - Gold quant TB 12/30/2018 neg   ddx for pulmonary infiltrates diffuse   Miscellaneous:Alv microlithiasis, alv proteinosis, asp, bronchiectais, BOOP  > ESR not impressive but cover for now just with   prednisone x 6 days then regroup in 2 weeks with cxr ARDS/ AIP Occupational dz/ HSP Neoplasm Infection seems unlikely though still coughing up slt green mucus so rex levaquin x 10 days to cover bronchiectasis flare also  Drug None of the usual suspects listed  Pulmonary emboli, Protein disorders Edema> no orthopnea/ pnd or LE edema/ no s3 > should do bnp or return if still sob  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       Upper airway cough syndrome Ace cough 2017, resolved  Onset  recurrent cough Nov 2020  Some of her coughing is clearly not related to lung dz but more typical of Upper airway cough syndrome (previously labeled PNDS),  is so named because it's frequently impossible to sort out how much is  CR/sinusitis with freq throat clearing (which can be related to primary GERD)   vs  causing  secondary (" extra esophageal")  GERD from wide swings in gastric pressure that occur with throat clearing, often  promoting self use of mint and menthol lozenges that reduce the lower esophageal sphincter tone and exacerbate the problem further in a cyclical fashion.   These are the same pts (now being labeled as having "irritable larynx syndrome" by some cough centers) who not infrequently have a history of having failed to tolerate ace inhibitors,  dry powder inhalers or biphosphonates or report having atypical/extraesophageal reflux symptoms that don't respond to standard doses of PPI  and are easily confused as having aecopd or asthma flares by even experienced allergists/ pulmonologists (myself included).   >>> short term rx with ppi ac bid and diet/ reviewed        Total time devoted to counseling  > 50 % of initial 60 min office visit:  review case with pt/ discussion of options/alternatives/ personally creating written customized instructions  in presence  of pt  then going over those specific  Instructions directly with the pt including how to use all of the meds but in particular covering each new medication in detail and the difference between the maintenance= "automatic" meds and the prns using an action plan format for the latter (If this problem/symptom => do that organization reading Left to right).  Please see AVS from this visit for a full list of these instructions which I personally wrote for this pt and  are unique to this visit.        Christinia Gully, MD 12/30/2018

## 2018-12-31 ENCOUNTER — Telehealth: Payer: Self-pay | Admitting: *Deleted

## 2018-12-31 NOTE — Telephone Encounter (Signed)

## 2018-12-31 NOTE — Telephone Encounter (Addendum)
Pr called and reported she is on Levaquin 500mg  qd x 10 days, Prednisone 10mg  tapered dose 4tabs x 2 days, 2 tabs x2 days, 1 tab x 2 days; pt is aware that the meds interact with Warfarin and st an appt on 4/24. Instructed pt to take Coumadin 1 tab tonight and 1 tab tomorrow and appt set

## 2019-01-01 LAB — QUANTIFERON-TB GOLD PLUS
Mitogen-NIL: >10 IU/mL
NIL: 0.02 IU/mL
QuantiFERON-TB Gold Plus: NEGATIVE
TB1-NIL: 0 IU/mL
TB2-NIL: 0 IU/mL

## 2019-01-01 LAB — CBC WITH DIFFERENTIAL/PLATELET
Absolute Monocytes: 716 cells/uL (ref 200–950)
Basophils Absolute: 54 cells/uL (ref 0–200)
Basophils Relative: 0.7 %
Eosinophils Absolute: 131 cells/uL (ref 15–500)
Eosinophils Relative: 1.7 %
HCT: 42.4 % (ref 35.0–45.0)
Hemoglobin: 14 g/dL (ref 11.7–15.5)
Lymphs Abs: 3018 cells/uL (ref 850–3900)
MCH: 26.6 pg — ABNORMAL LOW (ref 27.0–33.0)
MCHC: 33 g/dL (ref 32.0–36.0)
MCV: 80.5 fL (ref 80.0–100.0)
MPV: 10.5 fL (ref 7.5–12.5)
Monocytes Relative: 9.3 %
Neutro Abs: 3781 cells/uL (ref 1500–7800)
Neutrophils Relative %: 49.1 %
Platelets: 237 10*3/uL (ref 140–400)
RBC: 5.27 10*6/uL — ABNORMAL HIGH (ref 3.80–5.10)
RDW: 14.6 % (ref 11.0–15.0)
Total Lymphocyte: 39.2 %
WBC: 7.7 10*3/uL (ref 3.8–10.8)

## 2019-01-01 LAB — RESPIRATORY ALLERGY PROFILE REGION II ~~LOC~~

## 2019-01-01 LAB — BASIC METABOLIC PANEL
BUN/Creatinine Ratio: 20 (calc) (ref 6–22)
BUN: 22 mg/dL (ref 7–25)
CO2: 30 mmol/L (ref 20–32)
Calcium: 9.6 mg/dL (ref 8.6–10.4)
Chloride: 108 mmol/L (ref 98–110)
Creat: 1.1 mg/dL — ABNORMAL HIGH (ref 0.60–0.93)
Glucose, Bld: 55 mg/dL — ABNORMAL LOW (ref 65–99)
Potassium: 4.3 mmol/L (ref 3.5–5.3)
Sodium: 144 mmol/L (ref 135–146)

## 2019-01-01 LAB — SEDIMENTATION RATE: Sed Rate: 33 mm/h — ABNORMAL HIGH (ref 0–30)

## 2019-01-01 LAB — INTERPRETATION:

## 2019-01-01 NOTE — Progress Notes (Signed)
Spoke with pt and notified of results per Dr. Wert. Pt verbalized understanding and denied any questions. 

## 2019-01-02 ENCOUNTER — Ambulatory Visit (INDEPENDENT_AMBULATORY_CARE_PROVIDER_SITE_OTHER): Payer: Medicare Other | Admitting: Pharmacist

## 2019-01-02 ENCOUNTER — Other Ambulatory Visit: Payer: Self-pay

## 2019-01-02 DIAGNOSIS — I253 Aneurysm of heart: Secondary | ICD-10-CM | POA: Diagnosis not present

## 2019-01-02 DIAGNOSIS — Z7901 Long term (current) use of anticoagulants: Secondary | ICD-10-CM

## 2019-01-02 LAB — POCT INR: INR: 2.5 (ref 2.0–3.0)

## 2019-01-03 ENCOUNTER — Encounter: Payer: Self-pay | Admitting: Internal Medicine

## 2019-01-08 ENCOUNTER — Telehealth: Payer: Self-pay

## 2019-01-08 NOTE — Telephone Encounter (Signed)
lmom for prescreen  

## 2019-01-08 NOTE — Telephone Encounter (Signed)

## 2019-01-09 ENCOUNTER — Other Ambulatory Visit: Payer: Self-pay

## 2019-01-09 ENCOUNTER — Ambulatory Visit (INDEPENDENT_AMBULATORY_CARE_PROVIDER_SITE_OTHER): Payer: Medicare Other | Admitting: *Deleted

## 2019-01-09 DIAGNOSIS — I253 Aneurysm of heart: Secondary | ICD-10-CM | POA: Diagnosis not present

## 2019-01-09 DIAGNOSIS — Z7901 Long term (current) use of anticoagulants: Secondary | ICD-10-CM

## 2019-01-09 LAB — POCT INR: INR: 2.7 (ref 2.0–3.0)

## 2019-01-09 NOTE — Patient Instructions (Signed)
Description   Spoke with pt and instructed pt to resume normal dose of 1 tablet daily except 1.5 tablets each Sunday, Wednesday, and Fridays (completed prednisone taper and levaquin 500mg  daily on 5/1).  Repeat INR in 2 weeks.

## 2019-01-12 ENCOUNTER — Encounter: Payer: Self-pay | Admitting: Internal Medicine

## 2019-01-12 ENCOUNTER — Other Ambulatory Visit: Payer: Self-pay

## 2019-01-12 ENCOUNTER — Ambulatory Visit (INDEPENDENT_AMBULATORY_CARE_PROVIDER_SITE_OTHER): Payer: Medicare Other

## 2019-01-12 ENCOUNTER — Ambulatory Visit: Payer: Medicare Other | Admitting: Internal Medicine

## 2019-01-12 VITALS — BP 122/80 | HR 82 | Temp 98.5°F | Ht 67.5 in | Wt 213.6 lb

## 2019-01-12 DIAGNOSIS — R918 Other nonspecific abnormal finding of lung field: Secondary | ICD-10-CM

## 2019-01-12 DIAGNOSIS — R0600 Dyspnea, unspecified: Secondary | ICD-10-CM

## 2019-01-12 DIAGNOSIS — R0609 Other forms of dyspnea: Secondary | ICD-10-CM | POA: Insufficient documentation

## 2019-01-12 DIAGNOSIS — R05 Cough: Secondary | ICD-10-CM

## 2019-01-12 DIAGNOSIS — R058 Other specified cough: Secondary | ICD-10-CM

## 2019-01-12 LAB — CBC WITH DIFFERENTIAL/PLATELET
Basophils Absolute: 0.1 10*3/uL (ref 0.0–0.1)
Basophils Relative: 1.2 % (ref 0.0–3.0)
Eosinophils Absolute: 0.2 10*3/uL (ref 0.0–0.7)
Eosinophils Relative: 1.8 % (ref 0.0–5.0)
HCT: 41 % (ref 36.0–46.0)
Hemoglobin: 13.2 g/dL (ref 12.0–15.0)
Lymphocytes Relative: 26 % (ref 12.0–46.0)
Lymphs Abs: 2.4 10*3/uL (ref 0.7–4.0)
MCHC: 32.2 g/dL (ref 30.0–36.0)
MCV: 81.9 fl (ref 78.0–100.0)
Monocytes Absolute: 1 10*3/uL (ref 0.1–1.0)
Monocytes Relative: 10.8 % (ref 3.0–12.0)
Neutro Abs: 5.6 10*3/uL (ref 1.4–7.7)
Neutrophils Relative %: 60.2 % (ref 43.0–77.0)
Platelets: 178 10*3/uL (ref 150.0–400.0)
RBC: 5.01 Mil/uL (ref 3.87–5.11)
RDW: 16.5 % — ABNORMAL HIGH (ref 11.5–15.5)
WBC: 9.3 10*3/uL (ref 4.0–10.5)

## 2019-01-12 LAB — BRAIN NATRIURETIC PEPTIDE: Pro B Natriuretic peptide (BNP): 57 pg/mL (ref 0.0–100.0)

## 2019-01-12 LAB — SEDIMENTATION RATE: Sed Rate: 51 mm/hr — ABNORMAL HIGH (ref 0–30)

## 2019-01-12 NOTE — Patient Instructions (Signed)
Continue omeprazole Take 30- 60 min before your first and last meals of the day   Resume zyrtec 10 mg one daily as needed for itchy/ sneezing / drainage/ runny nose    Please remember to go to the lab and x-ray department   for your tests - we will call you with the results when they are available.      Please schedule a follow up office visit in 4 weeks, sooner if needed

## 2019-01-12 NOTE — Progress Notes (Signed)
Desiree Harrison, female    DOB: 1946/04/20,   MRN: [id number]   Brief patient profile:  89 yobf never regular smoker with no respiratory problems until her mid 6s with chronic rhinitis assoc itchy/ sneezing/ eyes [place]  referred to [facility] allergy > pos grass rec otc antihistamines only/ no inhalers >>  Zyrtec  seemed to work and maintained on it since then dx 2017 chf rx with lisinopril caused a cough but 100% resolved off it  indolent onset cough while on losartan in Nov 2019 while in [place] > UC at [facility] dx bilateral lower lobe pna per report rx levaquin /inhaler > much better but still cough so cxr done 08/27/18 ? Bilateral perihilar pna >   rx augmentin and then doxycline/amoxicillin with no improvement in cough abd cxr showing ? RUL as dz  So referred to pulmonary clinic 12/30/2018 by Dr   Kenton Kingfisher     History of Present Illness  12/30/2018  Pulmonary/ 1st office eval/Desiree Harrison  Chief Complaint  Patient presents with  . Pulmonary Consult    referred by Dr. Kenton Kingfisher (PCP) for recurrent pneumonia sinc Nov 2019; having nonproductive cough w/ light green mucus, SOB when outside, mild wheezing, chest tightness when laying down at night, denies fever/chills/sweats  Dyspnea:  MMRC3 = can't walk 100 yards even at a slow pace at a flat grade s stopping due to sob   Cough: first thing in am / min discolored  Sleep: worse wheezing immediately at hs . 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, alv proteinosis, asp, bronchiectais, BOOP  > ESR not impressive but cover for now just with   prednisone x 6 days then regroup in 2 weeks with cxr ARDS/ AIP Occupational dz/ HSP Neoplasm Infection seems unlikely though still coughing up slt green mucus so rex levaquin x 10 days to cover bronchiectasis flare also  Drug None of the usual suspects listed  Pulmonary emboli, Protein  disorders Edema> no orthopnea/ pnd or LE edema/ no s3 > should do bnp or return if still sob  Eosinophilic dz> peripheral eos not impressively elevated Sarcoidosis Connective tissue dz> no hx to suggest Hist X / Hemorrhage Idiopathic   REC Prednisone 10 mg take  4 each am x 2 days,   2 each am x 2 days,  1 each am x 2 days and stop  Levaquin 500 mg daily x 10 days Prilosec 20 mg otc Take 30- 60 min before your first and last meals of the day  GERD diet      01/12/2019  f/u ov/Desiree Harrison re: cough since nov 2019 / some better  Chief Complaint  Patient presents with  . Follow-up    Breathing is some better and she is coughing less. Cough is now non prod. She is using her albuterol inhaler about once per wk on average.   Dyspnea better  MMRC2 = can't walk a nl pace on a flat grade s sob but does fine slow and flat  Cough: p stirs , does not wake her up/ non productive - swallowing is better, no cough with meals Sleeping: one pillow / flat bed  SABA use: once p outside 02: none    No obvious day to day or daytime variability or assoc excess/ purulent sputum or mucus plugs or hemoptysis or cp or chest tightness, subjective wheeze or overt sinus or hb  symptoms.   Sleeping as above without nocturnal  or early am exacerbation  of respiratory  c/o's or need for noct saba. Also denies any obvious fluctuation of symptoms with weather or environmental changes or other aggravating or alleviating factors except as outlined above   No unusual exposure hx or h/o childhood pna/ asthma or knowledge of premature birth.  Current Allergies, Complete Past Medical History, Past Surgical History, Family History, and Social History were reviewed in [facility] electronic medical record.  ROS  The following are not active complaints unless bolded Hoarseness, sore throat, dysphagia, dental problems, itching, sneezing,  nasal congestion or discharge of excess mucus or purulent secretions, ear ache,   fever,  chills, sweats, unintended wt loss or wt gain, classically pleuritic or exertional cp,  orthopnea pnd or arm/hand swelling  or leg swelling, presyncope, palpitations, abdominal pain, anorexia, nausea, vomiting, diarrhea  or change in bowel habits or change in bladder habits, change in stools or change in urine, dysuria, hematuria,  rash, arthralgias, visual complaints, headache, numbness, weakness or ataxia or problems with walking or coordination,  change in mood or  memory.        Current Meds  Medication Sig  . albuterol (PROVENTIL HFA;VENTOLIN HFA) 108 (90 Base) MCG/ACT inhaler Inhale 1-2 puffs into the lungs every 6 (six) hours as needed for wheezing or shortness of breath.  . amLODipine (NORVASC) 5 MG tablet TAKE 1 TABLET BY MOUTH EVERY DAY  . aspirin EC 81 MG tablet TAKE 1 TABLET BY MOUTH EVERY DAY  . atorvastatin (LIPITOR) 80 MG tablet TAKE ONE TABLET BY MOUTH DAILY AT 6 PM  . carvedilol (COREG) 25 MG tablet Take 25 mg by mouth 2 (two) times daily with a meal.   . gabapentin (NEURONTIN) 100 MG capsule TAKE 2 CAPSULES BY MOUTH 2 TIMES DAILY  . insulin lispro (HUMALOG) 100 UNIT/ML injection Inject 0.1 mLs (10 Units total) into the skin 3 (three) times daily before meals.  . insulin lispro protamine-lispro (HUMALOG 50/50 MIX) (50-50) 100 UNIT/ML SUSP injection Inject 0.6 mLs (60 Units total) into the skin 2 (two) times daily before a meal.  . Insulin Pen Needle (NOVOFINE) 30G X 8 MM MISC Inject 10 each into the skin as needed.  . levothyroxine (SYNTHROID, LEVOTHROID) 50 MCG tablet Take 1 tablet (50 mcg total) by mouth daily.  . losartan (COZAAR) 100 MG tablet Take 1 tablet (100 mg total) by mouth daily.  . omeprazole (PRILOSEC) 20 MG capsule Take 30- 60 min before your first and last meals of the day  . promethazine-dextromethorphan (PROMETHAZINE-DM) 6.25-15 MG/5ML syrup Take 5 mLs by mouth 4 (four) times daily as needed for cough.  . warfarin (COUMADIN) 3 MG tablet TAKE 1 TABLET BY MOUTH  EVERY DAY AT 6 PM           Objective:       amb bf nad   Wt Readings from Last 3 Encounters:  01/12/19 213 lb 9.6 oz (96.9 kg)  12/30/18 208 lb 9.6 oz (94.6 kg)  11/24/18 210 lb (95.3 kg)     Vital signs reviewed - Note on arrival 02 sats  98% on RA       HEENT: Upper and lower partial dentures/ nl  turbinates bilaterally, and oropharynx. Nl external ear canals without cough reflex   NECK :  without JVD/Nodes/TM/ nl carotid upstrokes bilaterally   LUNGS: no acc muscle use,  Nl contour chest which is clear to A and P bilaterally without cough   on insp or exp maneuvers   CV:  RRR distant S1S2  s  s3 or murmur or increase in P2, and no edema   ABD:  soft and nontender with nl inspiratory excursion in the supine position. No bruits or organomegaly appreciated, bowel sounds nl  MS:  Nl gait/ ext warm without deformities, calf tenderness, cyanosis or clubbing No obvious joint restrictions   SKIN: warm and dry without lesions    NEURO:  alert, approp, nl sensorium with  no motor or cerebellar deficits apparent.      CXR PA and Lateral:   01/12/2019 :    I personally reviewed images and agree with radiology impression as follows:   Stable ill-defined right upper lobe opacity is noted. Given the lack of change since prior exam, it is unlikely this represents Pneumonia.   Labs ordered/ reviewed:       Lab Results  Component Value Date   WBC 9.3 01/12/2019   HGB 13.2 01/12/2019   HCT 41.0 01/12/2019   MCV 81.9 01/12/2019   PLT 178.0 01/12/2019       EOS                                                               0.2                                    01/12/2019       Lab Results  Component Value Date   TSH 4.120 08/19/2018     Lab Results  Component Value Date   PROBNP 57.0 01/12/2019       Lab Results  Component Value Date   ESRSEDRATE 51 (H) 01/12/2019   ESRSEDRATE 33 (H) 12/30/2018                     Assessment

## 2019-01-12 NOTE — Assessment & Plan Note (Signed)
Ace cough 2017, resolved  Onset recurrent cough Nov 2020  - Allergy profile  12/30/18 >  Eos 131 /  IgE  12  RAST  neg   - Sinus CT ordered as cough is better but still persists and needs CT chest same day.

## 2019-01-12 NOTE — Assessment & Plan Note (Addendum)
Onset Nov 2019  - 01/12/2019   Walked RA  2 laps @  approx 221ft each @ fast pace  stopped due to  End of study, no sob and sats 100% at end    No evidence for chf/ anemia or significant ILD so suspect this is mostly obesity/ conditioning related at this point pending f/u HRCT planned   I had an extended discussion with the patient   reviewing all relevant studies completed to date and  lasting 15 to 20 minutes of a 25 minute visit  which included directly observing ambulatory 02 saturation study documented in a/p section of  today's  office note.  Each maintenance medication was reviewed in detail including most importantly the difference between maintenance and prns and under what circumstances the prns are to be triggered using an action plan format that is not reflected in the computer generated alphabetically organized AVS.     Please see AVS for specific instructions unique to this visit that I personally wrote and verbalized to the the pt in detail and then reviewed with pt  by my nurse highlighting any changes in therapy recommended at today's visit .

## 2019-01-12 NOTE — Assessment & Plan Note (Signed)
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

## 2019-01-14 ENCOUNTER — Encounter: Payer: Self-pay | Admitting: Family Medicine

## 2019-01-14 ENCOUNTER — Ambulatory Visit (INDEPENDENT_AMBULATORY_CARE_PROVIDER_SITE_OTHER): Payer: Medicare Other | Admitting: Family Medicine

## 2019-01-14 ENCOUNTER — Other Ambulatory Visit: Payer: Self-pay

## 2019-01-14 DIAGNOSIS — Z95810 Presence of automatic (implantable) cardiac defibrillator: Secondary | ICD-10-CM

## 2019-01-14 DIAGNOSIS — J984 Other disorders of lung: Secondary | ICD-10-CM | POA: Diagnosis not present

## 2019-01-14 DIAGNOSIS — Z7901 Long term (current) use of anticoagulants: Secondary | ICD-10-CM

## 2019-01-14 DIAGNOSIS — I10 Essential (primary) hypertension: Secondary | ICD-10-CM | POA: Diagnosis not present

## 2019-01-14 DIAGNOSIS — Z794 Long term (current) use of insulin: Secondary | ICD-10-CM

## 2019-01-14 DIAGNOSIS — E1159 Type 2 diabetes mellitus with other circulatory complications: Secondary | ICD-10-CM | POA: Diagnosis not present

## 2019-01-14 DIAGNOSIS — E039 Hypothyroidism, unspecified: Secondary | ICD-10-CM

## 2019-01-14 NOTE — Addendum Note (Signed)
Addended by: [name] Perches on: 01/14/2019 08:38 AM   Modules accepted: Orders

## 2019-01-14 NOTE — Progress Notes (Signed)
Virtual Visit via Telephone Note  I connected with Desiree Harrison on 01/14/19 at  8:30 AM EDT by telephone and verified that I am speaking with the correct person using two identifiers.  Location: Patient: Located at home  during today's encounter Provider: Located at primary care office during today's encounter    I discussed the limitations, risks, security and privacy concerns of performing an evaluation and management service by telephone and the availability of in person appointments. I also discussed with the patient that there may be a patient responsible charge related to this service. The patient expressed understanding and agreed to proceed.   History of Present Illness: Pneumonia follow-up Desiree Harrison is recently status post her second episode of pneumonia since early December.  She was last seen in office on 11/24/2018 in which she had a abnormal chest x-ray following a recent diagnosis of right upper lobe pneumonia.  During the office visit she was in the process of completing dual antibiotic treatment for pneumonia as well as prednisone therapy.  Given her recent recurrence of pneumonia patient was then referred to pulmonology for further evaluation and also a CT of the chest had been ordered.  Due to COVID-19 CT of the chest was postponed and really scheduled for June.  Since that time patient has had 2 follow-up visits with Dr. Melvyn Novas at [facility] pulmonology and is now scheduled for CT of the chest and CT of the sinus cavity on 01/22/2019.  She reports that the shortness of breath has completely resolved and she only incidentally experiences an episode of a cough.  She is no longer wheezing.  However a repeat chest x-ray was performed on 01/12/2019 at the pulmonologist office which continued to show upper right lobe opacity which is been unchanged since the March x-ray.  Given that the abnormal chest x-ray has not improved and has not changed in the presence of a resolved pneumonia pulmonology did  order a CT of the chest to rule out malignancy or any other abnormal findings.  Diabetes Monitors blood sugar at home daily. Last A1C 9.0, March 2016. During that time patient had been prescribed at least 2-3 tapers of prednisone for pneumonia and dyspnea associated with infection. Insulin dose was increased. She has subsequently been prescribed another course of prednisone in April and noticed a slight increase in blood sugar readings, however, no readings in 200's. Over the last couple of weeks, blood sugars stable since completing prednisone. Readings consistently less than 140 fasting.   Hypertension/ICD/Chronic anticoagulation  Blood pressure last checked on 01/12/2019 and reading was stable at 122/80.  Patient continues to take blood pressure medication without any dizziness, headaches, weakness, lower extremity edema, or chest pain.  Patient also has an ICD in place and denies any recent notifications of normalities.  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  Most recent INR was 2.7.  Scheduled to follow-up with Coumadin clinic 01/23/19 and cardiology later in the year.  Assessment and Plan: 1. Type 2 diabetes mellitus with other circulatory complication, with long-term current use of insulin (HCC) Last A1C 9.0, 3 months prior. Will repeat A1C 02/24/19 and adjust medication dose if warranted. Home readings per patient have improved since prednisone has been completed. Encouraged activity as tolerated. Continue a modified carbohydrate diets and portion control.  2. Pulmonary lesion, right upper lobes, opacity, unchanged -CT Chest pending per Dr. Melvyn Novas 01/21/19, pulmonology   3. ICD (implantable cardioverter-defibrillator) in place -Monitored and  managed by cardiology  4. Long term current use of anticoagulant therapy -Continue follow-up at the Coumadin clinic.  Denies any recent episodes of bleeding.  5. Essential  hypertension Stable per reading 2 days ago.  No changes to blood pressure medications.  Patient will return in 5 week for blood work and a blood pressure check.  Now continue all medications as prescribed.  Notify the office of blood pressures consistently greater than 140/90.  Need to control blood pressure by adhering to DASH diet and engaging in physical activity as tolerated.  Follow Up Instructions: 02/24/19- A1C, CMP, Thyroid, blood pressure check   I discussed the assessment and treatment plan with the patient. The patient was provided an opportunity to ask questions and all were answered. The patient agreed with the plan and demonstrated an understanding of the instructions.   The patient was advised to call back or seek an in-person evaluation if the symptoms worsen or if the condition fails to improve as anticipated.  I provided 20 minutes of non-face-to-face time during this encounter.   Molli Barrows, FNP

## 2019-01-17 ENCOUNTER — Other Ambulatory Visit: Payer: Self-pay

## 2019-01-17 ENCOUNTER — Encounter (HOSPITAL_COMMUNITY): Payer: Self-pay

## 2019-01-17 ENCOUNTER — Observation Stay (HOSPITAL_COMMUNITY): Payer: Medicare Other

## 2019-01-17 ENCOUNTER — Emergency Department (HOSPITAL_COMMUNITY): Payer: Medicare Other

## 2019-01-17 ENCOUNTER — Telehealth: Payer: Self-pay | Admitting: Critical Care Medicine

## 2019-01-17 ENCOUNTER — Inpatient Hospital Stay (HOSPITAL_COMMUNITY)
Admission: EM | Admit: 2019-01-17 | Discharge: 2019-01-21 | DRG: 871 | Disposition: A | Discharge status: Home / Self Care | Payer: Medicare Other | Attending: Internal Medicine | Admitting: Internal Medicine

## 2019-01-17 DIAGNOSIS — Z833 Family history of diabetes mellitus: Secondary | ICD-10-CM

## 2019-01-17 DIAGNOSIS — E785 Hyperlipidemia, unspecified: Secondary | ICD-10-CM | POA: Diagnosis present

## 2019-01-17 DIAGNOSIS — J181 Lobar pneumonia, unspecified organism: Secondary | ICD-10-CM | POA: Diagnosis not present

## 2019-01-17 DIAGNOSIS — Z803 Family history of malignant neoplasm of breast: Secondary | ICD-10-CM

## 2019-01-17 DIAGNOSIS — I5042 Chronic combined systolic (congestive) and diastolic (congestive) heart failure: Secondary | ICD-10-CM | POA: Diagnosis present

## 2019-01-17 DIAGNOSIS — Z8701 Personal history of pneumonia (recurrent): Secondary | ICD-10-CM

## 2019-01-17 DIAGNOSIS — I10 Essential (primary) hypertension: Secondary | ICD-10-CM | POA: Diagnosis present

## 2019-01-17 DIAGNOSIS — Z419 Encounter for procedure for purposes other than remedying health state, unspecified: Secondary | ICD-10-CM

## 2019-01-17 DIAGNOSIS — E11649 Type 2 diabetes mellitus with hypoglycemia without coma: Secondary | ICD-10-CM | POA: Diagnosis not present

## 2019-01-17 DIAGNOSIS — A419 Sepsis, unspecified organism: Secondary | ICD-10-CM | POA: Diagnosis not present

## 2019-01-17 DIAGNOSIS — J189 Pneumonia, unspecified organism: Secondary | ICD-10-CM

## 2019-01-17 DIAGNOSIS — E119 Type 2 diabetes mellitus without complications: Secondary | ICD-10-CM

## 2019-01-17 DIAGNOSIS — Z79899 Other long term (current) drug therapy: Secondary | ICD-10-CM

## 2019-01-17 DIAGNOSIS — Z87891 Personal history of nicotine dependence: Secondary | ICD-10-CM

## 2019-01-17 DIAGNOSIS — Z9049 Acquired absence of other specified parts of digestive tract: Secondary | ICD-10-CM

## 2019-01-17 DIAGNOSIS — Z90710 Acquired absence of both cervix and uterus: Secondary | ICD-10-CM

## 2019-01-17 DIAGNOSIS — I2510 Atherosclerotic heart disease of native coronary artery without angina pectoris: Secondary | ICD-10-CM | POA: Diagnosis present

## 2019-01-17 DIAGNOSIS — I110 Hypertensive heart disease with heart failure: Secondary | ICD-10-CM | POA: Diagnosis present

## 2019-01-17 DIAGNOSIS — Z7901 Long term (current) use of anticoagulants: Secondary | ICD-10-CM

## 2019-01-17 DIAGNOSIS — Z95810 Presence of automatic (implantable) cardiac defibrillator: Secondary | ICD-10-CM | POA: Diagnosis present

## 2019-01-17 DIAGNOSIS — Z823 Family history of stroke: Secondary | ICD-10-CM

## 2019-01-17 DIAGNOSIS — I255 Ischemic cardiomyopathy: Secondary | ICD-10-CM | POA: Diagnosis present

## 2019-01-17 DIAGNOSIS — Z794 Long term (current) use of insulin: Secondary | ICD-10-CM

## 2019-01-17 DIAGNOSIS — Z79890 Hormone replacement therapy (postmenopausal): Secondary | ICD-10-CM

## 2019-01-17 DIAGNOSIS — R911 Solitary pulmonary nodule: Secondary | ICD-10-CM | POA: Diagnosis present

## 2019-01-17 DIAGNOSIS — Z20828 Contact with and (suspected) exposure to other viral communicable diseases: Secondary | ICD-10-CM | POA: Diagnosis present

## 2019-01-17 DIAGNOSIS — I252 Old myocardial infarction: Secondary | ICD-10-CM

## 2019-01-17 DIAGNOSIS — Z8249 Family history of ischemic heart disease and other diseases of the circulatory system: Secondary | ICD-10-CM

## 2019-01-17 DIAGNOSIS — Z22322 Carrier or suspected carrier of Methicillin resistant Staphylococcus aureus: Secondary | ICD-10-CM

## 2019-01-17 DIAGNOSIS — Z9889 Other specified postprocedural states: Secondary | ICD-10-CM

## 2019-01-17 DIAGNOSIS — Z7982 Long term (current) use of aspirin: Secondary | ICD-10-CM

## 2019-01-17 DIAGNOSIS — Z6833 Body mass index (BMI) 33.0-33.9, adult: Secondary | ICD-10-CM

## 2019-01-17 DIAGNOSIS — E039 Hypothyroidism, unspecified: Secondary | ICD-10-CM | POA: Diagnosis present

## 2019-01-17 DIAGNOSIS — E669 Obesity, unspecified: Secondary | ICD-10-CM | POA: Diagnosis present

## 2019-01-17 LAB — CBC WITH DIFFERENTIAL/PLATELET
Abs Immature Granulocytes: 0.04 10*3/uL (ref 0.00–0.07)
Basophils Absolute: 0 10*3/uL (ref 0.0–0.1)
Basophils Relative: 0 %
Eosinophils Absolute: 0.1 10*3/uL (ref 0.0–0.5)
Eosinophils Relative: 1 %
HCT: 38.9 % (ref 36.0–46.0)
Hemoglobin: 12.5 g/dL (ref 12.0–15.0)
Immature Granulocytes: 0 %
Lymphocytes Relative: 16 %
Lymphs Abs: 1.9 10*3/uL (ref 0.7–4.0)
MCH: 26.3 pg (ref 26.0–34.0)
MCHC: 32.1 g/dL (ref 30.0–36.0)
MCV: 81.9 fL (ref 80.0–100.0)
Monocytes Absolute: 1.2 10*3/uL — ABNORMAL HIGH (ref 0.1–1.0)
Monocytes Relative: 11 %
Neutro Abs: 8.3 10*3/uL — ABNORMAL HIGH (ref 1.7–7.7)
Neutrophils Relative %: 72 %
Platelets: 205 10*3/uL (ref 150–400)
RBC: 4.75 MIL/uL (ref 3.87–5.11)
RDW: 15.6 % — ABNORMAL HIGH (ref 11.5–15.5)
WBC: 11.6 10*3/uL — ABNORMAL HIGH (ref 4.0–10.5)
nRBC: 0 % (ref 0.0–0.2)

## 2019-01-17 LAB — URINALYSIS, ROUTINE W REFLEX MICROSCOPIC
Bacteria, UA: NONE SEEN
Bilirubin Urine: NEGATIVE
Glucose, UA: NEGATIVE mg/dL
Ketones, ur: NEGATIVE mg/dL
Nitrite: NEGATIVE
Protein, ur: 100 mg/dL — AB
Specific Gravity, Urine: 1.024 (ref 1.005–1.030)
WBC, UA: >50 WBC/hpf — ABNORMAL HIGH (ref 0–5)
pH: 5 (ref 5.0–8.0)

## 2019-01-17 LAB — COMPREHENSIVE METABOLIC PANEL
ALT: 18 U/L (ref 0–44)
AST: 28 U/L (ref 15–41)
Albumin: 2.7 g/dL — ABNORMAL LOW (ref 3.5–5.0)
Alkaline Phosphatase: 70 U/L (ref 38–126)
Anion gap: 13 (ref 5–15)
BUN: 16 mg/dL (ref 8–23)
CO2: 24 mmol/L (ref 22–32)
Calcium: 8.7 mg/dL — ABNORMAL LOW (ref 8.9–10.3)
Chloride: 104 mmol/L (ref 98–111)
Creatinine, Ser: 1.17 mg/dL — ABNORMAL HIGH (ref 0.44–1.00)
GFR calc Af Amer: 54 mL/min — ABNORMAL LOW (ref >60)
GFR calc non Af Amer: 47 mL/min — ABNORMAL LOW (ref >60)
Glucose, Bld: 179 mg/dL — ABNORMAL HIGH (ref 70–99)
Potassium: 3.8 mmol/L (ref 3.5–5.1)
Sodium: 141 mmol/L (ref 135–145)
Total Bilirubin: 0.9 mg/dL (ref 0.3–1.2)
Total Protein: 7.8 g/dL (ref 6.5–8.1)

## 2019-01-17 LAB — PROTIME-INR
INR: 2 — ABNORMAL HIGH (ref 0.8–1.2)
Prothrombin Time: 22.4 seconds — ABNORMAL HIGH (ref 11.4–15.2)

## 2019-01-17 LAB — PROCALCITONIN: Procalcitonin: <0.1 ng/mL

## 2019-01-17 LAB — LACTIC ACID, PLASMA
Lactic Acid, Venous: 1 mmol/L (ref 0.5–1.9)
Lactic Acid, Venous: 1.1 mmol/L (ref 0.5–1.9)

## 2019-01-17 LAB — GLUCOSE, CAPILLARY
Glucose-Capillary: 160 mg/dL — ABNORMAL HIGH (ref 70–99)
Glucose-Capillary: 147 mg/dL — ABNORMAL HIGH (ref 70–99)
Glucose-Capillary: 97 mg/dL (ref 70–99)

## 2019-01-17 LAB — SARS CORONAVIRUS 2 BY RT PCR (HOSPITAL ORDER, PERFORMED IN ~~LOC~~ HOSPITAL LAB): SARS Coronavirus 2: NEGATIVE

## 2019-01-17 MED ORDER — SODIUM CHLORIDE 0.9 % IV BOLUS
500.0000 mL | Freq: Once | INTRAVENOUS | Status: AC
  Administered 2019-01-17: 500 mL via INTRAVENOUS

## 2019-01-17 MED ORDER — CARVEDILOL 25 MG PO TABS
25.0000 mg | ORAL_TABLET | Freq: Two times a day (BID) | ORAL | Status: DC
  Administered 2019-01-17 – 2019-01-21 (×8): 25 mg via ORAL
  Filled 2019-01-17: qty 1
  Filled 2019-01-17: qty 2
  Filled 2019-01-17 (×7): qty 1

## 2019-01-17 MED ORDER — ENOXAPARIN SODIUM 100 MG/ML ~~LOC~~ SOLN
100.0000 mg | Freq: Two times a day (BID) | SUBCUTANEOUS | Status: AC
  Administered 2019-01-18: 100 mg via SUBCUTANEOUS
  Filled 2019-01-17 (×2): qty 1

## 2019-01-17 MED ORDER — ALBUTEROL SULFATE (2.5 MG/3ML) 0.083% IN NEBU
2.5000 mg | INHALATION_SOLUTION | Freq: Four times a day (QID) | RESPIRATORY_TRACT | Status: DC | PRN
  Administered 2019-01-18 – 2019-01-19 (×4): 2.5 mg via RESPIRATORY_TRACT
  Filled 2019-01-17 (×5): qty 3

## 2019-01-17 MED ORDER — INSULIN ASPART PROT & ASPART (70-30 MIX) 100 UNIT/ML ~~LOC~~ SUSP
30.0000 [IU] | Freq: Two times a day (BID) | SUBCUTANEOUS | Status: DC
  Administered 2019-01-17 – 2019-01-18 (×2): 30 [IU] via SUBCUTANEOUS
  Filled 2019-01-17: qty 10

## 2019-01-17 MED ORDER — ONDANSETRON HCL 4 MG/2ML IJ SOLN: 4.0000 mg | Freq: Four times a day (QID) | INTRAMUSCULAR | Status: DC | PRN

## 2019-01-17 MED ORDER — ACETAMINOPHEN 325 MG PO TABS
650.0000 mg | ORAL_TABLET | Freq: Once | ORAL | Status: AC | PRN
  Administered 2019-01-17: 650 mg via ORAL
  Filled 2019-01-17: qty 2

## 2019-01-17 MED ORDER — SODIUM CHLORIDE 0.9 % IV SOLN
2.0000 g | INTRAVENOUS | Status: DC
  Administered 2019-01-17 – 2019-01-21 (×5): 2 g via INTRAVENOUS
  Filled 2019-01-17 (×7): qty 20

## 2019-01-17 MED ORDER — LEVOTHYROXINE SODIUM 50 MCG PO TABS
50.0000 ug | ORAL_TABLET | Freq: Every day | ORAL | Status: DC
  Administered 2019-01-17 – 2019-01-21 (×4): 50 ug via ORAL
  Filled 2019-01-17 (×5): qty 1

## 2019-01-17 MED ORDER — ATORVASTATIN CALCIUM 80 MG PO TABS
80.0000 mg | ORAL_TABLET | Freq: Every evening | ORAL | Status: DC
  Administered 2019-01-17 – 2019-01-20 (×4): 80 mg via ORAL
  Filled 2019-01-17 (×4): qty 1

## 2019-01-17 MED ORDER — LACTATED RINGERS IV SOLN
INTRAVENOUS | Status: DC
  Administered 2019-01-17 – 2019-01-20 (×6): via INTRAVENOUS

## 2019-01-17 MED ORDER — PANTOPRAZOLE SODIUM 40 MG PO TBEC
40.0000 mg | DELAYED_RELEASE_TABLET | Freq: Every day | ORAL | Status: DC
  Administered 2019-01-17 – 2019-01-21 (×4): 40 mg via ORAL
  Filled 2019-01-17 (×5): qty 1

## 2019-01-17 MED ORDER — WARFARIN - PHARMACIST DOSING INPATIENT: Freq: Every day | Status: DC

## 2019-01-17 MED ORDER — ACETAMINOPHEN 650 MG RE SUPP: 650.0000 mg | Freq: Four times a day (QID) | RECTAL | Status: DC | PRN

## 2019-01-17 MED ORDER — ACETAMINOPHEN 325 MG PO TABS
650.0000 mg | ORAL_TABLET | Freq: Four times a day (QID) | ORAL | Status: DC | PRN
  Administered 2019-01-18 – 2019-01-20 (×4): 650 mg via ORAL
  Filled 2019-01-17 (×4): qty 2

## 2019-01-17 MED ORDER — WARFARIN SODIUM 4 MG PO TABS: 4.0000 mg | ORAL_TABLET | Freq: Once | ORAL | Status: DC

## 2019-01-17 MED ORDER — POLYETHYLENE GLYCOL 3350 17 G PO PACK: 17.0000 g | PACK | Freq: Every day | ORAL | Status: DC | PRN

## 2019-01-17 MED ORDER — INSULIN ASPART 100 UNIT/ML ~~LOC~~ SOLN: 0.0000 [IU] | Freq: Every day | SUBCUTANEOUS | Status: DC

## 2019-01-17 MED ORDER — INSULIN ASPART 100 UNIT/ML ~~LOC~~ SOLN
0.0000 [IU] | Freq: Three times a day (TID) | SUBCUTANEOUS | Status: DC
  Administered 2019-01-17: 3 [IU] via SUBCUTANEOUS
  Administered 2019-01-17: 2 [IU] via SUBCUTANEOUS

## 2019-01-17 MED ORDER — GABAPENTIN 100 MG PO CAPS
200.0000 mg | ORAL_CAPSULE | Freq: Two times a day (BID) | ORAL | Status: DC
  Administered 2019-01-17 – 2019-01-21 (×8): 200 mg via ORAL
  Filled 2019-01-17 (×10): qty 2

## 2019-01-17 MED ORDER — SODIUM CHLORIDE 0.9% FLUSH
3.0000 mL | Freq: Two times a day (BID) | INTRAVENOUS | Status: DC
  Administered 2019-01-17 – 2019-01-21 (×7): 3 mL via INTRAVENOUS

## 2019-01-17 MED ORDER — NOREPINEPHRINE 4 MG/250ML-% IV SOLN: 0.0000 ug/min | INTRAVENOUS | Status: DC

## 2019-01-17 MED ORDER — DOCUSATE SODIUM 100 MG PO CAPS
100.0000 mg | ORAL_CAPSULE | Freq: Two times a day (BID) | ORAL | Status: DC
  Administered 2019-01-17 – 2019-01-21 (×8): 100 mg via ORAL
  Filled 2019-01-17 (×8): qty 1

## 2019-01-17 MED ORDER — LORATADINE 10 MG PO TABS
10.0000 mg | ORAL_TABLET | Freq: Every day | ORAL | Status: DC
  Administered 2019-01-17 – 2019-01-21 (×4): 10 mg via ORAL
  Filled 2019-01-17 (×5): qty 1

## 2019-01-17 MED ORDER — ASPIRIN EC 81 MG PO TBEC
81.0000 mg | DELAYED_RELEASE_TABLET | Freq: Every day | ORAL | Status: DC
  Administered 2019-01-17 – 2019-01-21 (×4): 81 mg via ORAL
  Filled 2019-01-17 (×5): qty 1

## 2019-01-17 MED ORDER — FLUTICASONE PROPIONATE 50 MCG/ACT NA SUSP
2.0000 | Freq: Every day | NASAL | Status: DC
  Administered 2019-01-17 – 2019-01-21 (×4): 2 via NASAL
  Filled 2019-01-17: qty 16

## 2019-01-17 MED ORDER — ALBUTEROL SULFATE HFA 108 (90 BASE) MCG/ACT IN AERS: 1.0000 | INHALATION_SPRAY | Freq: Four times a day (QID) | RESPIRATORY_TRACT | Status: DC | PRN

## 2019-01-17 MED ORDER — SODIUM CHLORIDE 0.9 % IV SOLN
500.0000 mg | INTRAVENOUS | Status: DC
  Administered 2019-01-17 – 2019-01-21 (×5): 500 mg via INTRAVENOUS
  Filled 2019-01-17 (×6): qty 500

## 2019-01-17 MED ORDER — ONDANSETRON HCL 4 MG PO TABS: 4.0000 mg | ORAL_TABLET | Freq: Four times a day (QID) | ORAL | Status: DC | PRN

## 2019-01-17 NOTE — Plan of Care (Signed)
  Problem: Education: Reviewed with client plan of care, why increases resp symptoms, importance of diet compliance  Goal: Knowledge of General Education information will improve Description Including pain rating scale, medication(s)/side effects and non-pharmacologic comfort measures Outcome: Progressing   Problem: Clinical Measurements: Goal: Respiratory complications will improve Outcome: Progressing   Problem: Activity: Goal: Risk for activity intolerance will decrease Outcome: Progressing   Problem: Nutrition: Goal: Adequate nutrition will be maintained Outcome: Progressing

## 2019-01-17 NOTE — Telephone Encounter (Signed)
Call from patient's daughter describing new onset fever of 101.3 and worsening cough.  Patient had been seen in past by Dr. Melvyn Novas for persistent cough.  This cleared with steroids but now has returned with fever.  No reported sick contacts but has been out in public with a mask.  Has a number of co-morbidites including DM.  No CP but chills and aching.  I have recommended the patient go to the [facility] for COVID testing and further evaluation.  Daugher is agreeable to this plan.

## 2019-01-17 NOTE — ED Provider Notes (Addendum)
[facility] DEPARTMENT Provider Note   CSN: [id number] Arrival date & time: 01/17/19  0046    History   Chief Complaint Chief Complaint  Patient presents with  . Cough    HPI Desiree Harrison is a 74 y.o. female.     HPI   Pt is a 73 y/o female with a h/o CHF, CAD, on chronic anticoagulation (warfarin), s/p defibrillator, diabetes, who presents to the ED today for evaluation of a cough that she states began several months ago. Sxs initially had improved however several days ago the cough worsened. Cough is intermittently productive of green mucous.  Developed fevers today. Denies SOB, CP. Reports mild nasal congestion and scratchy throat. No BLE swelling. No abd pain, NVD. No dysuria, frequency or urgency.   States she just recently finished Levaquin about 1 week ago. She was also on prednisone. Prior to that she was on amoxicillin.    Recently referred to pulm. Scheduled for CT chest 5/15.  H/o tobacco use.   Past Medical History:  Diagnosis Date  . Acute systolic heart failure ([place])   . CAD in native artery   . Cardiomyopathy ([place])   . Long term (current) use of anticoagulants   . S/P internal cardiac defibrillator procedure    placed 10/28/15  . STEMI (ST elevation myocardial infarction) ([place])   . Type 2 diabetes mellitus, with long-term current use of insulin ([place])   . Ventricular aneurysm     Patient Active Problem List   Diagnosis Date Noted  . DOE (dyspnea on exertion) 01/12/2019  . Upper airway cough syndrome 12/30/2018  . Pulmonary infiltrates on CXR 12/30/2018  . Long term (current) use of anticoagulants 08/27/2018  . Diabetes mellitus ([place]) 08/22/2018  . Essential hypertension 08/22/2018  . Ventricular aneurysm 08/22/2018  . Long term current use of anticoagulant therapy 08/22/2018  . Hypothyroidism 08/22/2018  . Hyperlipidemia 08/22/2018  . Pneumonia of both lungs due to infectious organism 08/22/2018  . ICD (implantable  cardioverter-defibrillator) in place 08/22/2018  . Chronic combined systolic and diastolic heart failure ([place]) 08/21/2018    Past Surgical History:  Procedure Laterality Date  . ABDOMINAL HYSTERECTOMY    . APPENDECTOMY    . BREAST BIOPSY Left   . CARDIAC CATHETERIZATION    . CHOLECYSTECTOMY       OB History   No obstetric history on file.      Home Medications    Prior to Admission medications   Medication Sig Start Date End Date Taking? Authorizing Provider  albuterol (PROVENTIL HFA;VENTOLIN HFA) 108 (90 Base) MCG/ACT inhaler Inhale 1-2 puffs into the lungs every 6 (six) hours as needed for wheezing or shortness of breath. 11/12/18  Yes Yu, Amy V, PA-C  amLODipine (NORVASC) 5 MG tablet Take 5 mg by mouth daily.  01/13/18  Yes [provider]  aspirin EC 81 MG tablet Take 81 mg by mouth daily.  09/16/15  Yes [provider]  atorvastatin (LIPITOR) 80 MG tablet Take 80 mg by mouth every evening.  01/27/18  Yes [provider]  carvedilol (COREG) 25 MG tablet Take 25 mg by mouth 2 (two) times daily with a meal.  08/27/17  Yes [provider]  cetirizine (ZYRTEC) 10 MG tablet Take 10 mg by mouth at bedtime.   Yes [provider]  gabapentin (NEURONTIN) 100 MG capsule TAKE 2 CAPSULES BY MOUTH 2 TIMES DAILY Patient taking differently: Take 200 mg by mouth 2 (two) times daily.  12/29/18  Yes Scot Jun, FNP  insulin lispro (HUMALOG) 100 UNIT/ML injection Inject 0.1 mLs (10 Units total) into the skin 3 (three) times daily before meals. 10/16/18  Yes Scot Jun, FNP  insulin lispro protamine-lispro (HUMALOG 50/50 MIX) (50-50) 100 UNIT/ML SUSP injection Inject 0.6 mLs (60 Units total) into the skin 2 (two) times daily before a meal. Patient taking differently: Inject 50 Units into the skin 2 (two) times daily before a meal.  12/01/18  Yes Scot Jun, FNP  Insulin Pen Needle (NOVOFINE) 30G X 8 MM MISC Inject 10 each into the skin as  needed. 10/16/18  Yes Scot Jun, FNP  levothyroxine (SYNTHROID, LEVOTHROID) 50 MCG tablet Take 1 tablet (50 mcg total) by mouth daily. 10/03/18  Yes Scot Jun, FNP  losartan (COZAAR) 100 MG tablet Take 1 tablet (100 mg total) by mouth daily. 10/02/18  Yes Croitoru, Mihai, MD  omeprazole (PRILOSEC) 20 MG capsule Take 30- 60 min before your first and last meals of the day 12/30/18  Yes Tanda Rockers, MD  warfarin (COUMADIN) 3 MG tablet TAKE 1 TABLET BY MOUTH EVERY DAY AT 6 PM Patient taking differently: Take 3-4.5 mg by mouth daily. Wed, Fri and Sun take 4.5 mg all other days take 3 mg 12/23/18  Yes Croitoru, Mihai, MD    Family History Family History  Problem Relation Age of Onset  . Breast cancer Mother   . Coronary artery disease Mother   . Diabetes Mother   . Stroke Father     Social History Social History   Tobacco Use  . Smoking status: Former Smoker    Packs/day: 0.25    Types: Cigarettes    Last attempt to quit: 12/30/1963    Years since quitting: 55.0  . Smokeless tobacco: Never Used  . Tobacco comment: pt smoked 5-6/cigs daily 12/30/2018  Substance Use Topics  . Alcohol use: Not Currently  . Drug use: Never     Allergies   Lisinopril   Review of Systems Review of Systems  Constitutional: Positive for chills and fever.  HENT: Positive for congestion and sore throat. Negative for ear pain.   Eyes: Negative for visual disturbance.  Respiratory: Positive for cough. Negative for shortness of breath.   Cardiovascular: Negative for chest pain.  Gastrointestinal: Negative for abdominal pain and vomiting.  Genitourinary: Negative for dysuria and hematuria.  Musculoskeletal: Negative for arthralgias and back pain.  Skin: Negative for color change and rash.  Neurological: Negative for headaches.  All other systems reviewed and are negative.    Physical Exam Updated Vital Signs BP (!) 102/56   Pulse 80   Temp 98.5 F (36.9 C) (Oral)   Resp (!) 24    SpO2 94%   Physical Exam Vitals signs and nursing note reviewed.  Constitutional:      General: She is not in acute distress.    Appearance: She is well-developed.  HENT:     Head: Normocephalic and atraumatic.     Mouth/Throat:     Mouth: Mucous membranes are dry.  Eyes:     Conjunctiva/sclera: Conjunctivae normal.  Neck:     Musculoskeletal: Neck supple.  Cardiovascular:     Rate and Rhythm: Normal rate and regular rhythm.     Pulses: Normal pulses.     Heart sounds: Normal heart sounds. No murmur.  Pulmonary:     Effort: Pulmonary effort is normal. No respiratory distress.     Comments: Crackles to rll, tachypneic on monitor,  speaking in full sentences Abdominal:     General: Bowel sounds are normal. There is no distension.     Palpations: Abdomen is soft.     Tenderness: There is no abdominal tenderness. There is no guarding or rebound.  Musculoskeletal:        General: No tenderness.     Right lower leg: Edema (1+) present.     Left lower leg: No edema.  Skin:    General: Skin is warm and dry.  Neurological:     Mental Status: She is alert.      ED Treatments / Results  Labs (all labs ordered are listed, but only abnormal results are displayed) Labs Reviewed  COMPREHENSIVE METABOLIC PANEL - Abnormal; Notable for the following components:      Result Value   Glucose, Bld 179 (*)    Creatinine, Ser 1.17 (*)    Calcium 8.7 (*)    Albumin 2.7 (*)    GFR calc non Af Amer 47 (*)    GFR calc Af Amer 54 (*)    All other components within normal limits  CBC WITH DIFFERENTIAL/PLATELET - Abnormal; Notable for the following components:   WBC 11.6 (*)    RDW 15.6 (*)    Neutro Abs 8.3 (*)    Monocytes Absolute 1.2 (*)    All other components within normal limits  URINALYSIS, ROUTINE W REFLEX MICROSCOPIC - Abnormal; Notable for the following components:   APPearance CLOUDY (*)    Hgb urine dipstick SMALL (*)    Protein, ur 100 (*)    Leukocytes,Ua LARGE (*)     WBC, UA >50 (*)    Non Squamous Epithelial 0-5 (*)    All other components within normal limits  PROTIME-INR - Abnormal; Notable for the following components:   Prothrombin Time 22.4 (*)    INR 2.0 (*)    All other components within normal limits  SARS CORONAVIRUS 2 (HOSPITAL ORDER, [facility])  CULTURE, BLOOD (ROUTINE X 2)  CULTURE, BLOOD (ROUTINE X 2)  URINE CULTURE  LACTIC ACID, PLASMA  LACTIC ACID, PLASMA    EKG EKG Interpretation  Date/Time:  Saturday Jan 17 2019 01:21:21 EDT Ventricular Rate:  85 PR Interval:    QRS Duration: 65 QT Interval:  419 QTC Calculation: 499 R Axis:   62 Text Interpretation:  Sinus rhythm Anterior infarct, age indeterminate No old tracing to compare Confirmed by Delora Fuel ([id number]) on 01/17/2019 2:00:51 AM   Radiology Ct Chest Wo Contrast  Result Date: 01/17/2019 CLINICAL DATA:  73 y/o F; 3 months of productive cough. History of pneumonia. EXAM: CT CHEST WITHOUT CONTRAST TECHNIQUE: Multidetector CT imaging of the chest was performed following the standard protocol without IV contrast. COMPARISON:  01/17/2019 chest radiograph FINDINGS: Cardiovascular: No significant vascular findings. Normal heart size. No pericardial effusion. Mild aortic and moderate coronary artery calcific atherosclerosis. Single lead AICD noted. Mediastinum/Nodes: No enlarged mediastinal or axillary lymph nodes. Thyroid gland, trachea, and esophagus demonstrate no significant findings. Lungs/Pleura: Dense consolidation within the right upper lobe with surrounding ground-glass opacity along the major and minor fissures. Discrete nodule in the right middle lobe measuring 11 x 18 mm (series 4, image 25). Trace right pleural effusion. No pneumothorax. Upper Abdomen: Cholecystectomy. Moderate hiatal hernia. Musculoskeletal: No fracture is seen. IMPRESSION: 1. Large consolidation within the right upper lobe compatible with pneumonia. No necrosis. 2. Discrete nodule  in the right middle lobe measuring up to 11 x 18 mm may  be infectious/inflammatory. Follow-up CT chest after resolution of pneumonia is recommended to assess persistence. 3. Trace right pleural effusion. 4. Mild aortic and moderate coronary artery calcific atherosclerosis. 5. Moderate hiatal hernia. Electronically Signed   By: Kristine Garbe M.D.   On: 01/17/2019 04:46   Dg Chest Portable 1 View  Result Date: 01/17/2019 CLINICAL DATA:  73 year old female with shortness of breath and cough. EXAM: PORTABLE CHEST 1 VIEW COMPARISON:  Chest radiograph dated 01/12/2019 FINDINGS: There is a patchy area of airspace opacity in the right upper lobe which is new since the prior radiograph of 01/12/2019 most consistent with pneumonia. Clinical correlation and follow-up to resolution recommended. There is no pleural effusion or pneumothorax. Stable mild cardiomegaly. Left pectoral AICD device. No acute osseous pathology. IMPRESSION: Right upper lobe pneumonia.  Follow-up to resolution recommended. Electronically Signed   By: Anner Crete M.D.   On: 01/17/2019 02:32    Procedures Procedures (including critical care time) CRITICAL CARE Performed by: Rodney Booze   Total critical care time: 38 minutes  Critical care time was exclusive of separately billable procedures and treating other patients.  Critical care was necessary to treat or prevent imminent or life-threatening deterioration.  Critical care was time spent personally by me on the following activities: development of treatment plan with patient and/or surrogate as well as nursing, discussions with consultants, evaluation of patient's response to treatment, examination of patient, obtaining history from patient or surrogate, ordering and performing treatments and interventions, ordering and review of laboratory studies, ordering and review of radiographic studies, pulse oximetry and re-evaluation of patient's condition.   Medications  Ordered in ED Medications  cefTRIAXone (ROCEPHIN) 2 g in sodium chloride 0.9 % 100 mL IVPB (0 g Intravenous Stopped 01/17/19 0309)  azithromycin (ZITHROMAX) 500 mg in sodium chloride 0.9 % 250 mL IVPB (0 mg Intravenous Stopped 01/17/19 0354)  sodium chloride 0.9 % bolus 500 mL (has no administration in time range)  acetaminophen (TYLENOL) tablet 650 mg (650 mg Oral Given 01/17/19 0250)  sodium chloride 0.9 % bolus 500 mL (0 mLs Intravenous Stopped 01/17/19 0454)     Initial Impression / Assessment and Plan / ED Course  I have reviewed the triage vital signs and the nursing notes.  Pertinent labs & imaging results that were available during my care of the patient were reviewed by me and considered in my medical decision making (see chart for details).      Final Clinical Impressions(s) / ED Diagnoses   Final diagnoses:  Community acquired pneumonia of right upper lobe of lung ([place])   Patient presents to the ED for evaluation of cough and fevers.  No chest pain or shortness of breath.  No known COVID of her exposures.  Recently finished Levaquin 1 week ago.  On arrival patient febrile and tachypneic.  Blood pressures, heart rate and O2 saturations reassuring.  Code sepsis initiated based on patient's temperature, tachypnea and suspected upper respiratory infection.  IV antibiotics given for community-acquired pneumonia.  500 cc bolus of fluids given.  Patient has normal blood pressures, does have history of CHF therefore gentle hydration preferred while awaiting lactic acid.  CBC with mild leukocytosis. CMP with mildly elevated creatinine though not significantly different from baseline.  Otherwise CMP reassuring. Blood cultures obtained INR is at 2. Lactic acid is normal, will proceed with gentle IV fluid hydration if pressures remain normal. Coronavirus testing is negative. UA shows hematuria, proteinuria, large leukocytes, 21-50 RBCs, greater than 50 WBCs and no bacteria.  11-20 squamous  epithelial cells, suspect specimen may be contaminated.  She has received ceftriaxone.  Will send urine for culture.  Chest x-ray shows right upper lobe pneumonia  CT of the chest without contrast was obtained which showed a large consolidation within the right upper lobe compatible with pneumonia, no obvious necrosis.  There is also a nodule in the right middle lobe measuring 11 x 18 mm which could be infectious versus inflammatory, will need follow-up.  Patient informed of results.  Also with mild trace pleural effusion.  Discussed findings and plan for admission with the patient who is agreeable with this.  6:07 PM CONSULT with Dr. Hal Hope who accepts pt for admission.   Patient seen in conjunction with Dr. Roxanne Mins who personally evaluated the patient and is in agreement with the plan.   ----------------  Desiree Harrison was evaluated in Emergency Department on 01/17/2019 for the symptoms described in the history of present illness. She was evaluated in the context of the global COVID-19 pandemic, which necessitated consideration that the patient might be at risk for infection with the SARS-CoV-2 virus that causes COVID-19. Institutional protocols and algorithms that pertain to the evaluation of patients at risk for COVID-19 are in a state of rapid change based on information released by regulatory bodies including the CDC and federal and state organizations. These policies and algorithms were followed during the patient's care in the ED.   ED Discharge Orders    None       Rodney Booze, PA-C 01/17/19 7517    Rodney Booze, PA-C 01/17/19 0017    Rodney Booze, PA-C 49/44/96 [id number]    Delora Fuel, MD 63/84/66 [phone]

## 2019-01-17 NOTE — Consult Note (Addendum)
NAME:  Desiree Harrison, MRN:  [id number], DOB:  1945-11-01, LOS: 0 ADMISSION DATE:  01/17/2019, CONSULTATION DATE:  01/17/2019 REFERRING MD:  Thomasenia Bottoms MD, CHIEF COMPLAINT: Abnormal CT scan  Brief History   73 year old with history as below presenting with chronic cough, fever.  She had been treated intermittently for pneumonia for the past several months.  Initially diagnosed in November 2019 and treated with multiple rounds of antibiotic.  Noted to have right upper lobe pneumonia on chest x-ray on 11/12/2018 Seen in pulmonary clinic recently but was unable to get a CT due to [place] restrictions Admitted to the hospital on 5/9 with CT showing right upper lobe consolidation.  She has minimal smoking history in college.  Quit about 50 years ago.  No exposure history.  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  No evidence of mediastinal or hilar lymphadenopathy.  I have reviewed the images personally.  Significant Diagnostic Tests:    Micro Data:  SARS COV2 5/9-negative  Antimicrobials:  Azithromycin 5/9 > Ceftriaxone 5/9 >  Interim history/subjective:    Objective   Blood pressure 111/65, pulse 73, temperature 98.1 F (36.7 C), temperature source Oral, resp. rate (!) 24, SpO2 98 %.        Intake/Output Summary (Last 24 hours) at 01/17/2019 1243 Last data filed at 01/17/2019 0802 Gross per 24 hour  Intake 1750 ml  Output -  Net 1750 ml   There were no vitals filed for this visit.  Examination: Gen:      No acute distress HEENT:  EOMI, sclera anicteric Neck:     No masses; no thyromegaly Lungs:    Clear to auscultation bilaterally; normal respiratory effort CV:         Regular rate and rhythm; no murmurs Abd:      +  bowel sounds; soft, non-tender; no palpable masses, no distension Ext:    No edema; adequate peripheral perfusion Skin:      Warm and dry; no rash Neuro: alert and oriented x 3 Psych: normal mood and affect  Resolved Hospital Problem list     Assessment & Plan:  73 year old with recurrent pneumonias since November 2019, persistent right upper lobe opacity CT today reviewed with right upper lobe consolidation.  Differential diagnosis includes recurrent pneumonia, malignancy with postobstructive infection, noninfectious process such as eosinophilic pneumonia, COP  Continue antibiotics We will schedule for bronchoscopy.  Tentatively scheduled for Monday pending availability of bronch suite/staff and correction of INR Hold Coumadin and bridge with Lovenox  Risks/benefits of procedure discussed with patient and she has agreed to go ahead with it.  Labs   CBC: Recent Labs  Lab 01/12/19 0921 01/17/19 0230  WBC 9.3 11.6*  NEUTROABS 5.6 8.3*  HGB 13.2 12.5  HCT 41.0 38.9  MCV 81.9 81.9  PLT 178.0 322    Basic Metabolic Panel: Recent Labs  Lab 01/17/19 0230  NA 141  K 3.8  CL 104  CO2 24  GLUCOSE 179*  BUN 16  CREATININE 1.17*  CALCIUM 8.7*   GFR: Estimated Creatinine Clearance: 53.2 mL/min (A) (by C-G formula based on SCr of 1.17 mg/dL (H)). Recent Labs  Lab 01/12/19 0921 01/17/19 0225 01/17/19 0230 01/17/19 0928 01/17/19 1138  PROCALCITON  --   --   --   --  <  0.10  WBC 9.3  --  11.6*  --   --   LATICACIDVEN  --  1.0  --  1.1  --     Liver Function Tests: Recent Labs  Lab 01/17/19 0230  AST 28  ALT 18  ALKPHOS 70  BILITOT 0.9  PROT 7.8  ALBUMIN 2.7*   No results for input(s): LIPASE, AMYLASE in the last 168 hours. No results for input(s): AMMONIA in the last 168 hours.  ABG No results found for: PHART, PCO2ART, PO2ART, HCO3, TCO2, ACIDBASEDEF, O2SAT   Coagulation Profile: Recent Labs  Lab 01/17/19 0230  INR 2.0*    Cardiac Enzymes: No  results for input(s): CKTOTAL, CKMB, CKMBINDEX, TROPONINI in the last 168 hours.  HbA1C: Hgb A1c MFr Bld  Date/Time Value Ref Range Status  11/24/2018 11:24 AM 9.0 (H) 4.8 - 5.6 % Final    Comment:             Prediabetes: 5.7 - 6.4          Diabetes: >6.4          Glycemic control for adults with diabetes: <7.0   08/19/2018 09:19 AM 13.4 (H) 4.8 - 5.6 % Final    Comment:             Prediabetes: 5.7 - 6.4          Diabetes: >6.4          Glycemic control for adults with diabetes: <7.0     CBG: Recent Labs  Lab 01/17/19 1152  GLUCAP Perry MD [facility] Pager [phone] If no answer call [phone] 01/17/2019, 1:16 PM

## 2019-01-17 NOTE — H&P (Signed)
History and Physical    Desiree Harrison XLK:[id number] DOB: August 02, 1946 DOA: 01/17/2019  PCP: Desiree Jun, FNP Consultants:  Croitoru - cardiology; Wert - pulmonology;  Patient coming from:  Home - lives with daughter; Desiree Harrison: Daughter, [phone]  Chief Complaint: Cough  HPI: Desiree Harrison is a 73 y.o. 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  She has been coughing with fever.  She has been dealing with PNA off and on for 4 months.  She had abx in November and other times including recently.  She has taken Amoxicillin, Levaquin, and Prednisone.  Her last course of Levaquin was 1-2 weeks ago.  She has never felt bad but has had the same aggravating cough.  No SOB.  No sick contacts.  She established care with her new PCP on 12/10.  That note indicated that she had been diagnosed with PNA at an urgent care on 07/27/18 (Amoxicillin prescribed) and the patient had a lingering cough.  Ms. Desiree Harrison ordered a repeat CXR in 2 weeks, and on 08/27/18 CXR showed CM and increased perihilar markings without clear PNA.  She was given Augmentin at that time.   She was taken off Losartan in January due to the cough, since she had developed cough in the past with Lisinopril.  There was no apparent c/o cough at her office visit on 2/6.  She was seen in the ER on 3/4 and had RUL PNA on CXR, given Augmentin and doxycycline.   She had minimal improvement on repeat imaging 3/16 and had chest CT ordered and was referred to pulmonology - but these were pushed back due to [place].  She was given Tessalon perles and cough syrup on 3/30.  She was seen for her initial visit by Dr. Melvyn Harrison on 4/21 and was prescribed prednisone x 6 days as well as Levaquin x 10 days.  He also added a PPI BID.  She followed up with Dr. Melvyn Harrison on 5/4 and CXR showed persistent RUL opacity concerning for alternative diagnosis and so HRCT and sinus CT were ordered at that  time.  ESR 4/21 33.  Quantiferon gold negative 4/21.  His underlying diagnosis for her dyspnea was thought to be related to obesity and deconditioning.   ED Course:Carryover, per Dr. 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 Was treated for pneumonia as outpatient since patient was not improving presents to the ER. CT chest confirms large consolidation admitted for failed outpatient medication for pneumonia and inpatient therapy. COVID-19 was negative.  Review of Systems: As per HPI; otherwise review of systems reviewed and negative.   Ambulatory Status:  Ambulates without assistance  Past Medical History:  Diagnosis Date  . Acute systolic heart failure ([place])   . CAD in native artery   . Cardiomyopathy ([place])   . Long term (current) use of anticoagulants   . S/P internal cardiac defibrillator procedure    placed 10/28/15  . STEMI (ST elevation myocardial infarction) ([place])   . Type 2 diabetes mellitus, with long-term current use of insulin ([place])   . Ventricular aneurysm     Past Surgical History:  Procedure Laterality Date  . ABDOMINAL HYSTERECTOMY    . APPENDECTOMY    . BREAST BIOPSY Left   . CARDIAC CATHETERIZATION    . CHOLECYSTECTOMY      Social History  Socioeconomic History  . Marital status: Widowed    Spouse name: Not on file  . Number of children: Not on file  . Years of education: Not on file  . Highest education level: Not on file  Occupational History  . Not on file  Social Needs  . Financial resource strain: Not on file  . Food insecurity:    Worry: Not on file    Inability: Not on file  . Transportation needs:    Medical: Not on file    Non-medical: Not on file  Tobacco Use  . Smoking status: Former Smoker    Packs/day: 0.25    Types: Cigarettes    Last attempt to quit: 12/30/1963    Years since quitting:  55.0  . Smokeless tobacco: Never Used  . Tobacco comment: pt smoked 5-6/cigs daily 12/30/2018  Substance and Sexual Activity  . Alcohol use: Not Currently  . Drug use: Never  . Sexual activity: Not on file  Lifestyle  . Physical activity:    Days per week: Not on file    Minutes per session: Not on file  . Stress: Not on file  Relationships  . Social connections:    Talks on phone: Not on file    Gets together: Not on file    Attends religious service: Not on file    Active member of club or organization: Not on file    Attends meetings of clubs or organizations: Not on file    Relationship status: Not on file  . Intimate partner violence:    Fear of current or ex partner: Not on file    Emotionally abused: Not on file    Physically abused: Not on file    Forced sexual activity: Not on file  Other Topics Concern  . Not on file  Social History Narrative  . Not on file    Allergies  Allergen Reactions  . Lisinopril Cough    Family History  Problem Relation Age of Onset  . Breast cancer Mother   . Coronary artery disease Mother   . Diabetes Mother   . Stroke Father     Prior to Admission medications   Medication Sig Start Date End Date Taking? Authorizing Provider  albuterol (PROVENTIL HFA;VENTOLIN HFA) 108 (90 Base) MCG/ACT inhaler Inhale 1-2 puffs into the lungs every 6 (six) hours as needed for wheezing or shortness of breath. 11/12/18  Yes Yu, Amy V, PA-C  amLODipine (NORVASC) 5 MG tablet Take 5 mg by mouth daily.  01/13/18  Yes [provider]  aspirin EC 81 MG tablet Take 81 mg by mouth daily.  09/16/15  Yes [provider]  atorvastatin (LIPITOR) 80 MG tablet Take 80 mg by mouth every evening.  01/27/18  Yes [provider]  carvedilol (COREG) 25 MG tablet Take 25 mg by mouth 2 (two) times daily with a meal.  08/27/17  Yes [provider]  cetirizine (ZYRTEC) 10 MG tablet Take 10 mg by mouth at bedtime.   Yes [provider]   gabapentin (NEURONTIN) 100 MG capsule TAKE 2 CAPSULES BY MOUTH 2 TIMES DAILY Patient taking differently: Take 200 mg by mouth 2 (two) times daily.  12/29/18  Yes Desiree Jun, FNP  insulin lispro (HUMALOG) 100 UNIT/ML injection Inject 0.1 mLs (10 Units total) into the skin 3 (three) times daily before meals. 10/16/18  Yes Desiree Jun, FNP  insulin lispro protamine-lispro (HUMALOG 50/50 MIX) (50-50) 100 UNIT/ML SUSP injection Inject 0.6 mLs (  60 Units total) into the skin 2 (two) times daily before a meal. Patient taking differently: Inject 50 Units into the skin 2 (two) times daily before a meal.  12/01/18  Yes Desiree Jun, FNP  Insulin Pen Needle (NOVOFINE) 30G X 8 MM MISC Inject 10 each into the skin as needed. 10/16/18  Yes Desiree Jun, FNP  levothyroxine (SYNTHROID, LEVOTHROID) 50 MCG tablet Take 1 tablet (50 mcg total) by mouth daily. 10/03/18  Yes Desiree Jun, FNP  losartan (COZAAR) 100 MG tablet Take 1 tablet (100 mg total) by mouth daily. 10/02/18  Yes Croitoru, Mihai, MD  omeprazole (PRILOSEC) 20 MG capsule Take 30- 60 min before your first and last meals of the day 12/30/18  Yes Tanda Rockers, MD  warfarin (COUMADIN) 3 MG tablet TAKE 1 TABLET BY MOUTH EVERY DAY AT 6 PM Patient taking differently: Take 3-4.5 mg by mouth daily. Wed, Fri and Sun take 4.5 mg all other days take 3 mg 12/23/18  Yes Croitoru, Dani Gobble, MD    Physical Exam: Vitals:   01/17/19 0615 01/17/19 0630 01/17/19 0800 01/17/19 0829  BP: 110/61 (!) 107/57 100/72 111/65  Pulse: 76 77 75 73  Resp: (!) 22 (!) 26 (!) 24   Temp:    98.1 F (36.7 C)  TempSrc:    Oral  SpO2: 95% 98% 96% 98%     . General:  Appears calm and comfortable and is NAD . Eyes:  PERRL, EOMI, normal lids, iris . ENT:  grossly normal hearing, lips & tongue, mmm; appropriate dentition; significant nasal congestion . Neck:  no LAD, masses or thyromegaly . Cardiovascular:  RRR, no m/r/g. No LE edema.  Marland Kitchen Respiratory:   CTA  bilaterally with no wheezes/rales/rhonchi.  Normal to mildly increased respiratory effort. . Abdomen:  soft, NT, ND, NABS . Back:   normal alignment, no CVAT . Skin:  no rash or induration seen on limited exam . Musculoskeletal:  grossly normal tone BUE/BLE, good ROM, no bony abnormality . Psychiatric:  grossly normal mood and affect, speech fluent and appropriate, AOx3 . Neurologic:  CN 2-12 grossly intact, moves all extremities in coordinated fashion, sensation intact    Radiological Exams on Admission: Ct Chest Wo Contrast  Result Date: 01/17/2019 CLINICAL DATA:  73 y/o F; 3 months of productive cough. History of pneumonia. EXAM: CT CHEST WITHOUT CONTRAST TECHNIQUE: Multidetector CT imaging of the chest was performed following the standard protocol without IV contrast. COMPARISON:  01/17/2019 chest radiograph FINDINGS: Cardiovascular: No significant vascular findings. Normal heart size. No pericardial effusion. Mild aortic and moderate coronary artery calcific atherosclerosis. Single lead AICD noted. Mediastinum/Nodes: No enlarged mediastinal or axillary lymph nodes. Thyroid gland, trachea, and esophagus demonstrate no significant findings. Lungs/Pleura: Dense consolidation within the right upper lobe with surrounding ground-glass opacity along the major and minor fissures. Discrete nodule in the right middle lobe measuring 11 x 18 mm (series 4, image 25). Trace right pleural effusion. No pneumothorax. Upper Abdomen: Cholecystectomy. Moderate hiatal hernia. Musculoskeletal: No fracture is seen. IMPRESSION: 1. Large consolidation within the right upper lobe compatible with pneumonia. No necrosis. 2. Discrete nodule in the right middle lobe measuring up to 11 x 18 mm may be infectious/inflammatory. Follow-up CT chest after resolution of pneumonia is recommended to assess persistence. 3. Trace right pleural effusion. 4. Mild aortic and moderate coronary artery calcific atherosclerosis. 5. Moderate hiatal  hernia. Electronically Signed   By: Kristine Garbe M.D.   On: 01/17/2019 04:46   Dg  Chest Portable 1 View  Result Date: 01/17/2019 CLINICAL DATA:  73 year old female with shortness of breath and cough. EXAM: PORTABLE CHEST 1 VIEW COMPARISON:  Chest radiograph dated 01/12/2019 FINDINGS: There is a patchy area of airspace opacity in the right upper lobe which is new since the prior radiograph of 01/12/2019 most consistent with pneumonia. Clinical correlation and follow-up to resolution recommended. There is no pleural effusion or pneumothorax. Stable mild cardiomegaly. Left pectoral AICD device. No acute osseous pathology. IMPRESSION: Right upper lobe pneumonia.  Follow-up to resolution recommended. Electronically Signed   By: Anner Crete M.D.   On: 01/17/2019 02:32    EKG: Independently reviewed.  NSR with rate 85; nonspecific ST changes with no evidence of acute ischemia   Labs on Admission: I have personally reviewed the available labs and imaging studies at the time of the admission.  Pertinent labs:   Glucose 179 BUN 16/Creatinine 1.17/GFR 47 Albumin 2.7 WBC 11.6 INR 2.0 UA: small Hgb, large LE, 100 protein COVID negative  Assessment/Plan Principal Problem:   Sepsis due to pneumonia [place]) Active Problems:   Chronic combined systolic and diastolic heart failure (HCC)   Diabetes mellitus ([place])   Essential hypertension   Hypothyroidism   Hyperlipidemia   ICD (implantable cardioverter-defibrillator) in place   Long term (current) use of anticoagulants   Sepsis due to PNA -Patient with recurrent cough and PNA seen on CXR, having received 5 different rounds of antibiotics since 11/19 -She is presenting with cough and fever with sepsis physiology with SIRS criteria including: Leukocytosis, fever, tachypnea  -Patient has evidence of acute organ failure with borderline hypotension -While awaiting blood cultures, this appears to be a preseptic condition. -Sepsis protocol  initiated -CXR shows a large consolidation in RUQ c/w PNA -There is also a nodule 11 x 18 mm in the RML with recommended f/u CT chest -She is "addicted to Afrin" for years with daily use; she has been told to d/c this many times but she will not stop using it - her chronic cough may be related to this issue and chronic rebound  nasal congestion with PND but this would not explain recurrent infiltrates in the RUL as effectively -Blood and urine cultures pending -Will place in observation status with telemetry and continue to monitor -Treat with IV Rocephin/Azithromycin for now -Will order lower respiratory tract procalcitonin level.  Antibiotics would not be indicated for PCT <0.1 and probably should not be used for < 0.25.  >0.5 indicates infection and >>0.5 indicates more serious disease.  As the procalcitonin level normalizes, it will be reasonable to consider de-escalation of antibiotic coverage. -Dr. 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 on 07/15/15 -She had an unsuccessful attempt at percutaneous coronary intervention on 07/19/15 -EF was 18.5% on nuclear medicine scan in 1/17 -Improved EF to 50-55% in 6/18 -Coumadin dosing per pharmacy -Her chronic CHF does not appear to be a factor in her ongoing cough at this time -Continue Coreg  DM -A1c was 9.0 on 3/16, indicating suboptimal glycemic control -She is on 50/50 insulin at home with 50 units BID; formulary here substitutes for 70/30 and so will decrease the dose to 30 units BID for now due to the increased amount of long-acting insulin provided -Cover with moderate-scale SSI  HTN -  Continue Coreg -Hold other medications due to borderline hypotension on presentation, including Norvasc, Cozaar (not  taking)  HLD -Continue Lipitor  Hypothyroidism -Normal thyroid studies in 12/19 -Continue Synthroid at current dose for now    Note: This patient has been tested and is negative for the novel coronavirus COVID-19.  DVT prophylaxis: Coumadin Code Status:  Full - confirmed with patient Family Communication: None present; I called to discuss the patient with her daughter and her sister Disposition Plan:  Home once clinically improved Consults called: Pulmonology  Admission status: It is my clinical opinion that referral for OBSERVATION is reasonable and necessary in this patient based on the above information provided. The aforementioned taken together are felt to place the patient at high risk for further clinical deterioration. However it is anticipated that the patient may be medically stable for discharge from the hospital within 24 to 48 hours.     Karmen Bongo MD [facility]   How to contact the [facility] Attending or Consulting provider [place] or covering provider during after hours [place], for this patient?  1. Check the care team in [facility] and look for a) attending/consulting [facility] provider listed and b) the [facility] team listed 2. Log into [email or web address] and use [facility]'s universal password to access. If you do not have the password, please contact the hospital operator. 3. Locate the [facility] provider you are looking for under [facility] and page to a number that you can be directly reached. 4. If you still have difficulty reaching the provider, please page the [facility] (Director on Call) for the Hospitalists listed on amion for assistance.   01/17/2019, 10:42 AM

## 2019-01-17 NOTE — Progress Notes (Addendum)
ANTICOAGULATION CONSULT NOTE - Initial Consult  Pharmacy Consult for Warfarin Indication: ventricular aneurysm  Allergies  Allergen Reactions  . Lisinopril Cough    Patient Measurements:   Height: 5' 7.5" Weight 96.9 kg  Vital Signs: Temp: 98.1 F (36.7 C) (05/09 0829) Temp Source: Oral (05/09 0829) BP: 111/65 (05/09 0829) Pulse Rate: 73 (05/09 0829)  Labs: Recent Labs    01/17/19 0230  HGB 12.5  HCT 38.9  PLT 205  LABPROT 22.4*  INR 2.0*  CREATININE 1.17*    Estimated Creatinine Clearance: 53.2 mL/min (A) (by C-G formula based on SCr of 1.17 mg/dL (H)).   Medical History: Past Medical History:  Diagnosis Date  . Acute systolic heart failure ([place])   . CAD in native artery   . Cardiomyopathy ([place])   . Long term (current) use of anticoagulants   . S/P internal cardiac defibrillator procedure    placed 10/28/15  . STEMI (ST elevation myocardial infarction) ([place])   . Type 2 diabetes mellitus, with long-term current use of insulin ([place])   . Ventricular aneurysm     Medications:  PTA warfarin 4.5 mg on Wed/Fri/Sun, 3mg  all other days of the week, LD 5/7 at 1800   Assessment: 72 y/oF admitted on 01/17/19 with fever, SOB, cough, failed outpatient treatment for PNA. CT chest with large consolidation within RUL compatible with pneumonia. 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 Pharmacy consulted for warfarin dosing while patient admitted. 5/9 INR = 2. Hgb, Pltc WNL. On ASA 81mg  daily as per PTA regimen. No bleeding issues reported. On broad spectrum antibiotics, which can increase INR.    Goal of Therapy:  INR 2-3 Monitor platelets by anticoagulation protocol: Yes   Plan:   Warfarin 4mg  PO x 1 tonight (slightly higher than PTA dose since patient missed dose on 5/8, to hopefully prevent drop in INR to subtherapeutic range)  Daily PT/INR  Monitor CBC and for s/sx of bleeding   Lindell Spar, PharmD, BCPS Pager: [phone] 01/17/2019 10:26  AM   Addendum:  PCCM planning for bronchoscopy on 5/11. Per Dr. Vaughan Browner, hold warfarin for now. Bridging with lovenox 1mg /kg SQ q12h-last dose tomorrow AM.   F/u plans for resumption of anticoagulation after bronchoscopy.   Lindell Spar, PharmD, BCPS Pager: [phone] 01/17/2019 1:17 PM

## 2019-01-17 NOTE — ED Notes (Signed)
Patient transported to CT 

## 2019-01-17 NOTE — ED Notes (Signed)
ED TO INPATIENT HANDOFF REPORT  ED Nurse Name and Phone #:  Jess F [phone]  S Name/Age/Gender Desiree Harrison 73 y.o. female Room/Bed: 023C/023C  Code Status   Code Status: Not on file  Home/SNF/Other Home Patient oriented to: self, place, time and situation Is this baseline? Yes   Triage Complete: Triage complete  Chief Complaint Fever;cough  Triage Note Pt states that she has had a cough for the past 3-4 months with pneumonia, began to have fevers today. 102 in triage. Denies SOB or CP    Allergies Allergies  Allergen Reactions  . Lisinopril Cough    Level of Care/Admitting Diagnosis ED Disposition    ED Disposition Condition [facility] Area: [place] [[id number]]  Level of Care: Telemetry Medical [104]  I expect the patient will be discharged within 24 hours: No (not a candidate for 5C-Observation unit)  Covid Evaluation: N/A  Diagnosis: CAP (community acquired pneumonia) [891694]  Admitting Physician: Rise Patience [phone]  Attending Physician: Rise Patience [[id number]]  PT Class (Do Not Modify): Observation [104]  PT Acc Code (Do Not Modify): Observation [10022]       B Medical/Surgery History Past Medical History:  Diagnosis Date  . Acute systolic heart failure ([place])   . CAD in native artery   . Cardiomyopathy ([place])   . Long term (current) use of anticoagulants   . S/P internal cardiac defibrillator procedure    placed 10/28/15  . STEMI (ST elevation myocardial infarction) ([place] [place])   . Type 2 diabetes mellitus, with long-term current use of insulin ([place])   . Ventricular aneurysm    Past Surgical History:  Procedure Laterality Date  . ABDOMINAL HYSTERECTOMY    . APPENDECTOMY    . BREAST BIOPSY Left   . CARDIAC CATHETERIZATION    . CHOLECYSTECTOMY       A IV Location/Drains/Wounds Patient Lines/Drains/Airways Status   Active Line/Drains/Airways    Name:   Placement date:   Placement time:   Site:   Days:    Peripheral IV 01/17/19 Left Antecubital   01/17/19    0230    Antecubital   less than 1          Intake/Output Last 24 hours  Intake/Output Summary (Last 24 hours) at 01/17/2019 8828 Last data filed at 01/17/2019 0454 Gross per 24 hour  Intake 750 ml  Output -  Net 750 ml    Labs/Imaging Results for orders placed or performed during the hospital encounter of 01/17/19 (from the past 48 hour(s))  Lactic acid, plasma     Status: None   Collection Time: 01/17/19  2:25 AM  Result Value Ref Range   Lactic Acid, Venous 1.0 0.5 - 1.9 mmol/L    Comment: Performed at [facility], [address], [place], [place] [address]  Comprehensive metabolic panel     Status: Abnormal   Collection Time: 01/17/19  2:30 AM  Result Value Ref Range   Sodium 141 135 - 145 mmol/L   Potassium 3.8 3.5 - 5.1 mmol/L   Chloride 104 98 - 111 mmol/L   CO2 24 22 - 32 mmol/L   Glucose, Bld 179 (H) 70 - 99 mg/dL   BUN 16 8 - 23 mg/dL   Creatinine, Ser 1.17 (H) 0.44 - 1.00 mg/dL   Calcium 8.7 (L) 8.9 - 10.3 mg/dL   Total Protein 7.8 6.5 - 8.1 g/dL   Albumin 2.7 (L) 3.5 - 5.0 g/dL  AST 28 15 - 41 U/L   ALT 18 0 - 44 U/L   Alkaline Phosphatase 70 38 - 126 U/L   Total Bilirubin 0.9 0.3 - 1.2 mg/dL   GFR calc non Af Amer 47 (L) >60 mL/min   GFR calc Af Amer 54 (L) >60 mL/min   Anion gap 13 5 - 15    Comment: Performed at [facility] [address] [place], [place] [address]  CBC WITH DIFFERENTIAL     Status: Abnormal   Collection Time: 01/17/19  2:30 AM  Result Value Ref Range   WBC 11.6 (H) 4.0 - 10.5 K/uL   RBC 4.75 3.87 - 5.11 MIL/uL   Hemoglobin 12.5 12.0 - 15.0 g/dL   HCT 38.9 36.0 - 46.0 %   MCV 81.9 80.0 - 100.0 fL   MCH 26.3 26.0 - 34.0 pg   MCHC 32.1 30.0 - 36.0 g/dL   RDW 15.6 (H) 11.5 - 15.5 %   Platelets 205 150 - 400 K/uL   nRBC 0.0 0.0 - 0.2 %   Neutrophils Relative % 72 %   Neutro Abs 8.3 (H) 1.7 - 7.7 K/uL   Lymphocytes Relative 16 %   Lymphs Abs 1.9 0.7 - 4.0 K/uL    Monocytes Relative 11 %   Monocytes Absolute 1.2 (H) 0.1 - 1.0 K/uL   Eosinophils Relative 1 %   Eosinophils Absolute 0.1 0.0 - 0.5 K/uL   Basophils Relative 0 %   Basophils Absolute 0.0 0.0 - 0.1 K/uL   Immature Granulocytes 0 %   Abs Immature Granulocytes 0.04 0.00 - 0.07 K/uL    Comment: Performed at [facility], [address], [place], [place] [address]  Protime-INR     Status: Abnormal   Collection Time: 01/17/19  2:30 AM  Result Value Ref Range   Prothrombin Time 22.4 (H) 11.4 - 15.2 seconds   INR 2.0 (H) 0.8 - 1.2    Comment: (NOTE) INR goal varies based on device and disease states. Performed at [facility], [place] [address], [place],  [address]   SARS Coronavirus 2 [facility] order, Performed in [facility] hospital lab)     Status: None   Collection Time: 01/17/19  2:39 AM  Result Value Ref Range   SARS Coronavirus 2 NEGATIVE NEGATIVE    Comment: (NOTE) If result is NEGATIVE SARS-CoV-2 target nucleic acids are NOT DETECTED. The SARS-CoV-2 RNA is generally detectable in upper and lower  respiratory specimens during the acute phase of infection. The lowest  concentration of SARS-CoV-2 viral copies this assay can detect is 250  copies / mL. A negative result does not preclude SARS-CoV-2 infection  and should not be used as the sole basis for treatment or other  patient management decisions.  A negative result may occur with  improper specimen collection / handling, submission of specimen other  than nasopharyngeal swab, presence of viral mutation(s) within the  areas targeted by this assay, and inadequate number of viral copies  (<250 copies / mL). A negative result must be combined with clinical  observations, patient history, and epidemiological information. If result is POSITIVE SARS-CoV-2 target nucleic acids are DETECTED. The SARS-CoV-2 RNA is generally detectable in upper and lower  respiratory specimens dur ing the acute phase of infection.   Positive  results are indicative of active infection with SARS-CoV-2.  Clinical  correlation with patient history and other diagnostic information is  necessary to determine patient infection status.  Positive results do  not rule out bacterial infection or co-infection with other viruses. If result is PRESUMPTIVE POSTIVE SARS-CoV-2 nucleic acids MAY BE PRESENT.   A presumptive positive result was obtained on the submitted specimen  and confirmed on repeat testing.  While 2019 novel coronavirus  (SARS-CoV-2) nucleic acids may be present in the submitted sample  additional confirmatory testing may be necessary for epidemiological  and / or clinical management purposes  to differentiate between  SARS-CoV-2 and other Sarbecovirus currently known to infect humans.  If clinically indicated additional testing with an alternate test  methodology [phone]) is advised. The SARS-CoV-2 RNA is generally  detectable in upper and lower respiratory sp ecimens during the acute  phase of infection. The expected result is Negative. Fact Sheet for Patients:  StrictlyIdeas.no Fact Sheet for Healthcare Providers: BankingDealers.co.za This test is not yet approved or cleared by the [place] FDA and has been authorized for detection and/or diagnosis of SARS-CoV-2 by FDA under an Emergency Use Authorization (EUA).  This EUA will remain in effect (meaning this test can be used) for the duration of the COVID-19 declaration under Section 564(b)(1) of the Act, 21 U.S.C. section 360bbb-3(b)(1), unless the authorization is terminated or revoked sooner. Performed at [facility], [place] [address], [place], [place] [address]   Urinalysis, Routine w reflex microscopic     Status: Abnormal   Collection Time: 01/17/19  3:59 AM  Result Value Ref Range   Color, Urine YELLOW YELLOW   APPearance CLOUDY (A) CLEAR   Specific Gravity, Urine 1.024 1.005 - 1.030   pH  5.0 5.0 - 8.0   Glucose, UA NEGATIVE NEGATIVE mg/dL   Hgb urine dipstick SMALL (A) NEGATIVE   Bilirubin Urine NEGATIVE NEGATIVE   Ketones, ur NEGATIVE NEGATIVE mg/dL   Protein, ur 100 (A) NEGATIVE mg/dL   Nitrite NEGATIVE NEGATIVE   Leukocytes,Ua LARGE (A) NEGATIVE   RBC / HPF 21-50 0 - 5 RBC/hpf   WBC, UA >50 (H) 0 - 5 WBC/hpf   Bacteria, UA NONE SEEN NONE SEEN   Squamous Epithelial / LPF 11-20 0 - 5   Mucus PRESENT    Non Squamous Epithelial 0-5 (A) NONE SEEN    Comment: Performed at [facility], [place] [address] [place], [place] [address]   Ct Chest Wo Contrast  Result Date: 01/17/2019 CLINICAL DATA:  73 y/o F; 3 months of productive cough. History of pneumonia. EXAM: CT CHEST WITHOUT CONTRAST TECHNIQUE: Multidetector CT imaging of the chest was performed following the standard protocol without IV contrast. COMPARISON:  01/17/2019 chest radiograph FINDINGS: Cardiovascular: No significant vascular findings. Normal heart size. No pericardial effusion. Mild aortic and moderate coronary artery calcific atherosclerosis. Single lead AICD noted. Mediastinum/Nodes: No enlarged mediastinal or axillary lymph nodes. Thyroid gland, trachea, and esophagus demonstrate no significant findings. Lungs/Pleura: Dense consolidation within the right upper lobe with surrounding ground-glass opacity along the major and minor fissures. Discrete nodule in the right middle lobe measuring 11 x 18 mm (series 4, image 25). Trace right pleural effusion. No pneumothorax. Upper Abdomen: Cholecystectomy. Moderate hiatal hernia. Musculoskeletal: No fracture is seen. IMPRESSION: 1. Large consolidation within the right upper lobe compatible with pneumonia. No necrosis. 2. Discrete nodule in the right middle lobe measuring up to 11 x 18 mm may be infectious/inflammatory. Follow-up CT chest after resolution of pneumonia is recommended to assess persistence. 3. Trace right pleural effusion. 4. Mild aortic and moderate coronary  artery calcific atherosclerosis. 5. Moderate hiatal hernia.  Electronically Signed   By: Kristine Garbe M.D.   On: 01/17/2019 04:46   Dg Chest Portable 1 View  Result Date: 01/17/2019 CLINICAL DATA:  73 year old female with shortness of breath and cough. EXAM: PORTABLE CHEST 1 VIEW COMPARISON:  Chest radiograph dated 01/12/2019 FINDINGS: There is a patchy area of airspace opacity in the right upper lobe which is new since the prior radiograph of 01/12/2019 most consistent with pneumonia. Clinical correlation and follow-up to resolution recommended. There is no pleural effusion or pneumothorax. Stable mild cardiomegaly. Left pectoral AICD device. No acute osseous pathology. IMPRESSION: Right upper lobe pneumonia.  Follow-up to resolution recommended. Electronically Signed   By: Anner Crete M.D.   On: 01/17/2019 02:32    Pending Labs Unresulted Labs (From admission, onward)    Start     Ordered   01/17/19 0444  Urine culture  ONCE - STAT,   STAT     01/17/19 0443   01/17/19 0156  Lactic acid, plasma  Now then every 2 hours,   STAT     01/17/19 0155   01/17/19 0156  Blood Culture (routine x 2)  BLOOD CULTURE X 2,   STAT     01/17/19 0155          Vitals/Pain Today's Vitals   01/17/19 0445 01/17/19 0457 01/17/19 0545 01/17/19 0615  BP:  117/66 (!) 102/56 110/61  Pulse: 77 76 80 76  Resp: (!) 21 (!) 30 (!) 24 (!) 22  Temp:  98.5 F (36.9 C)    TempSrc:  Oral    SpO2: 95% 95% 94% 95%  PainSc:        Isolation Precautions No active isolations  Medications Medications  cefTRIAXone (ROCEPHIN) 2 g in sodium chloride 0.9 % 100 mL IVPB (0 g Intravenous Stopped 01/17/19 0309)  azithromycin (ZITHROMAX) 500 mg in sodium chloride 0.9 % 250 mL IVPB (0 mg Intravenous Stopped 01/17/19 0354)  acetaminophen (TYLENOL) tablet 650 mg (650 mg Oral Given 01/17/19 0250)  sodium chloride 0.9 % bolus 500 mL (0 mLs Intravenous Stopped 01/17/19 0454)  sodium chloride 0.9 % bolus 500 mL (500 mLs  Intravenous New Bag/Given 01/17/19 6644)    Mobility walks Low fall risk   Focused Assessments Pulmonary Assessment Handoff:  Lung sounds:   O2 Device: Room Air        R Recommendations: See Admitting Provider Note  Report given to:   Additional Notes: n/a

## 2019-01-17 NOTE — ED Notes (Signed)
Report given to [place], RN on [facility]- pt going to Room 2W-3

## 2019-01-17 NOTE — ED Triage Notes (Signed)
Pt states that she has had a cough for the past 3-4 months with pneumonia, began to have fevers today. 102 in triage. Denies SOB or CP

## 2019-01-17 NOTE — ED Notes (Signed)
Spoke with EDP about fluid resuscitation of 33.79mL/kg for code sepsis protocol. At this time, due to unknown lactic and history of heart failure, 541ml bolus will be administered and pt will be re-evaluated for further fluid resuscitation

## 2019-01-17 NOTE — ED Notes (Signed)
Attempted report 

## 2019-01-18 DIAGNOSIS — I2510 Atherosclerotic heart disease of native coronary artery without angina pectoris: Secondary | ICD-10-CM | POA: Diagnosis present

## 2019-01-18 DIAGNOSIS — Z87891 Personal history of nicotine dependence: Secondary | ICD-10-CM | POA: Diagnosis not present

## 2019-01-18 DIAGNOSIS — A419 Sepsis, unspecified organism: Secondary | ICD-10-CM | POA: Diagnosis present

## 2019-01-18 DIAGNOSIS — I252 Old myocardial infarction: Secondary | ICD-10-CM | POA: Diagnosis not present

## 2019-01-18 DIAGNOSIS — E669 Obesity, unspecified: Secondary | ICD-10-CM | POA: Diagnosis present

## 2019-01-18 DIAGNOSIS — Z7901 Long term (current) use of anticoagulants: Secondary | ICD-10-CM | POA: Diagnosis not present

## 2019-01-18 DIAGNOSIS — Z803 Family history of malignant neoplasm of breast: Secondary | ICD-10-CM | POA: Diagnosis not present

## 2019-01-18 DIAGNOSIS — Z95810 Presence of automatic (implantable) cardiac defibrillator: Secondary | ICD-10-CM | POA: Diagnosis not present

## 2019-01-18 DIAGNOSIS — R911 Solitary pulmonary nodule: Secondary | ICD-10-CM | POA: Diagnosis present

## 2019-01-18 DIAGNOSIS — E785 Hyperlipidemia, unspecified: Secondary | ICD-10-CM | POA: Diagnosis present

## 2019-01-18 DIAGNOSIS — Z22322 Carrier or suspected carrier of Methicillin resistant Staphylococcus aureus: Secondary | ICD-10-CM | POA: Diagnosis not present

## 2019-01-18 DIAGNOSIS — Z6833 Body mass index (BMI) 33.0-33.9, adult: Secondary | ICD-10-CM | POA: Diagnosis not present

## 2019-01-18 DIAGNOSIS — J189 Pneumonia, unspecified organism: Secondary | ICD-10-CM | POA: Diagnosis present

## 2019-01-18 DIAGNOSIS — Z9049 Acquired absence of other specified parts of digestive tract: Secondary | ICD-10-CM | POA: Diagnosis not present

## 2019-01-18 DIAGNOSIS — Z8249 Family history of ischemic heart disease and other diseases of the circulatory system: Secondary | ICD-10-CM | POA: Diagnosis not present

## 2019-01-18 DIAGNOSIS — Z20828 Contact with and (suspected) exposure to other viral communicable diseases: Secondary | ICD-10-CM | POA: Diagnosis present

## 2019-01-18 DIAGNOSIS — Z794 Long term (current) use of insulin: Secondary | ICD-10-CM | POA: Diagnosis not present

## 2019-01-18 DIAGNOSIS — I5042 Chronic combined systolic (congestive) and diastolic (congestive) heart failure: Secondary | ICD-10-CM | POA: Diagnosis present

## 2019-01-18 DIAGNOSIS — I255 Ischemic cardiomyopathy: Secondary | ICD-10-CM | POA: Diagnosis present

## 2019-01-18 DIAGNOSIS — I110 Hypertensive heart disease with heart failure: Secondary | ICD-10-CM | POA: Diagnosis present

## 2019-01-18 DIAGNOSIS — E11649 Type 2 diabetes mellitus with hypoglycemia without coma: Secondary | ICD-10-CM | POA: Diagnosis not present

## 2019-01-18 DIAGNOSIS — Z833 Family history of diabetes mellitus: Secondary | ICD-10-CM | POA: Diagnosis not present

## 2019-01-18 DIAGNOSIS — Z90710 Acquired absence of both cervix and uterus: Secondary | ICD-10-CM | POA: Diagnosis not present

## 2019-01-18 DIAGNOSIS — E039 Hypothyroidism, unspecified: Secondary | ICD-10-CM | POA: Diagnosis present

## 2019-01-18 DIAGNOSIS — J181 Lobar pneumonia, unspecified organism: Secondary | ICD-10-CM | POA: Diagnosis present

## 2019-01-18 LAB — PROTIME-INR
INR: 1.9 — ABNORMAL HIGH (ref 0.8–1.2)
Prothrombin Time: 21.7 seconds — ABNORMAL HIGH (ref 11.4–15.2)

## 2019-01-18 LAB — CBC
HCT: 34.1 % — ABNORMAL LOW (ref 36.0–46.0)
Hemoglobin: 10.7 g/dL — ABNORMAL LOW (ref 12.0–15.0)
MCH: 25.8 pg — ABNORMAL LOW (ref 26.0–34.0)
MCHC: 31.4 g/dL (ref 30.0–36.0)
MCV: 82.4 fL (ref 80.0–100.0)
Platelets: 219 10*3/uL (ref 150–400)
RBC: 4.14 MIL/uL (ref 3.87–5.11)
RDW: 15.6 % — ABNORMAL HIGH (ref 11.5–15.5)
WBC: 11.1 10*3/uL — ABNORMAL HIGH (ref 4.0–10.5)
nRBC: 0 % (ref 0.0–0.2)

## 2019-01-18 LAB — BASIC METABOLIC PANEL
Anion gap: 11 (ref 5–15)
BUN: 15 mg/dL (ref 8–23)
CO2: 25 mmol/L (ref 22–32)
Calcium: 8 mg/dL — ABNORMAL LOW (ref 8.9–10.3)
Chloride: 104 mmol/L (ref 98–111)
Creatinine, Ser: 1.19 mg/dL — ABNORMAL HIGH (ref 0.44–1.00)
GFR calc Af Amer: 53 mL/min — ABNORMAL LOW (ref >60)
GFR calc non Af Amer: 46 mL/min — ABNORMAL LOW (ref >60)
Glucose, Bld: 137 mg/dL — ABNORMAL HIGH (ref 70–99)
Potassium: 3.5 mmol/L (ref 3.5–5.1)
Sodium: 140 mmol/L (ref 135–145)

## 2019-01-18 LAB — GLUCOSE, CAPILLARY
Glucose-Capillary: 115 mg/dL — ABNORMAL HIGH (ref 70–99)
Glucose-Capillary: 108 mg/dL — ABNORMAL HIGH (ref 70–99)
Glucose-Capillary: 55 mg/dL — ABNORMAL LOW (ref 70–99)
Glucose-Capillary: 64 mg/dL — ABNORMAL LOW (ref 70–99)
Glucose-Capillary: 72 mg/dL (ref 70–99)
Glucose-Capillary: 135 mg/dL — ABNORMAL HIGH (ref 70–99)

## 2019-01-18 LAB — URINE CULTURE

## 2019-01-18 LAB — HIV ANTIBODY (ROUTINE TESTING W REFLEX): HIV Screen 4th Generation wRfx: NONREACTIVE

## 2019-01-18 MED ORDER — INSULIN NPH (HUMAN) (ISOPHANE) 100 UNIT/ML ~~LOC~~ SUSP
15.0000 [IU] | Freq: Two times a day (BID) | SUBCUTANEOUS | Status: DC
  Administered 2019-01-18: 15 [IU] via SUBCUTANEOUS
  Filled 2019-01-18: qty 10

## 2019-01-18 MED ORDER — INSULIN ASPART 100 UNIT/ML ~~LOC~~ SOLN: 5.0000 [IU] | Freq: Three times a day (TID) | SUBCUTANEOUS | Status: DC

## 2019-01-18 NOTE — Progress Notes (Addendum)
PROGRESS NOTE    Desiree Harrison  YQM:[id number] DOB: [birth year] DOA: 01/17/2019 PCP: Scot Jun, FNP  Brief Narrative:  Desiree Harrison is Desiree Harrison 73 y.o. 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  She has been coughing with fever.  She has been dealing with PNA off and on for 4 months.  She had abx in November and other times including recently.  She has taken Amoxicillin, Levaquin, and Prednisone.  Her last course of Levaquin was 1-2 weeks ago.  She has never felt bad but has had the same aggravating cough.  No SOB.  No sick contacts.  Assessment & Plan:   Principal Problem:   Sepsis due to pneumonia [facility]) Active Problems:   Chronic combined systolic and diastolic heart failure (HCC)   Diabetes mellitus ([place])   Essential hypertension   Hypothyroidism   Hyperlipidemia   ICD (implantable cardioverter-defibrillator) in place   Long term (current) use of anticoagulants  Sepsis due to PNA - CT scan with consolidation of RUL.  F/u recommended at 6-8 weeks to ensure resolution.  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  Consider PET-CT or biopsy.  DM -A1c was 9.0 on 3/16, indicating suboptimal glycemic control -She is on 50/50 insulin at home with 50 units BID -Will change to NPH 15 units BID and 5 units mealtime  -Cover with moderate-scale SSI  HTN -Continue Coreg -Hold other medications due to borderline hypotension on presentation, including Norvasc, Cozaar (not taking)  HLD -Continue Lipitor  Hypothyroidism -Normal thyroid studies in 12/19 -Continue Synthroid at current dose for now  DVT prophylaxis: scd, warfarin currently on hold Code Status: full  Family Communication: none at bedside Disposition Plan: pending.  Inpatient given recurrent pneumonia with plan for bronchoscopy 5/11 requiring continued IV abx.  Consultants:   pulmonology  Procedures:   none  Antimicrobials: Anti-infectives (From admission, onward)   Start     Dose/Rate Route Frequency Ordered Stop   01/17/19 0200  cefTRIAXone (ROCEPHIN) 2 g in sodium chloride 0.9 % 100 mL IVPB     2 g 200 mL/hr over 30 Minutes Intravenous Every 24 hours 01/17/19 0155     01/17/19 0200  azithromycin (ZITHROMAX) 500 mg in sodium chloride 0.9 % 250 mL IVPB     500 mg 250 mL/hr over 60 Minutes Intravenous Every 24 hours 01/17/19 0155        Subjective: Feeling better.  Objective: Vitals:   01/17/19 1705 01/17/19 2100 01/17/19 2306 01/18/19 0642  BP: 134/71 117/64 (!) 98/57 112/62  Pulse: 91 65 81 73  Resp:  (!) 22    Temp: (!) 101.1  F (38.4 C) 99.6 F (37.6 C) 99.1 F (37.3 C) 98.7 F (37.1 C)  TempSrc: Oral Oral Oral Oral  SpO2: 94% 95% 91% 95%    Intake/Output Summary (Last 24 hours) at 01/18/2019 1315 Last data filed at 01/17/2019 1500 Gross per 24 hour  Intake 316.76 ml  Output -  Net 316.76 ml   There were no vitals filed for this visit.  Examination:  General exam: Appears calm and comfortable  Respiratory system: Clear to  auscultation. Respiratory effort normal. Cardiovascular system: S1 & S2 heard, RRR.  Gastrointestinal system: Abdomen is nondistended, soft and nontender Central nervous system: Alert and oriented. No focal neurological deficits. Extremities: Symmetric 5 x 5 power. Skin: No rashes, lesions or ulcers Psychiatry: Judgement and insight appear normal. Mood & affect appropriate.     Data Reviewed: I have personally reviewed following labs and imaging studies  CBC: Recent Labs  Lab 01/12/19 0921 01/17/19 0230 01/18/19 0544  WBC 9.3 11.6* 11.1*  NEUTROABS 5.6 8.3*  --   HGB 13.2 12.5 10.7*  HCT 41.0 38.9 34.1*  MCV 81.9 81.9 82.4  PLT 178.0 205 627   Basic Metabolic Panel: Recent Labs  Lab 01/17/19 0230 01/18/19 0544  NA 141 140  K 3.8 3.5  CL 104 104  CO2 24 25  GLUCOSE 179* 137*  BUN 16 15  CREATININE 1.17* 1.19*  CALCIUM 8.7* 8.0*   GFR: Estimated Creatinine Clearance: 52.3 mL/min (Elianis Harrison) (by C-G formula based on SCr of 1.19 mg/dL (H)). Liver Function Tests: Recent Labs  Lab 01/17/19 0230  AST 28  ALT 18  ALKPHOS 70  BILITOT 0.9  PROT 7.8  ALBUMIN 2.7*   No results for input(s): LIPASE, AMYLASE in the last 168 hours. No results for input(s): AMMONIA in the last 168 hours. Coagulation Profile: Recent Labs  Lab 01/17/19 0230 01/18/19 0722  INR 2.0* 1.9*   Cardiac Enzymes: No results for input(s): CKTOTAL, CKMB, CKMBINDEX, TROPONINI in the last 168 hours. BNP (last 3 results) Recent Labs    01/12/19 0921  PROBNP 57.0   HbA1C: No results for input(s): HGBA1C in the last 72 hours. CBG: Recent Labs  Lab 01/17/19 1152 01/17/19 1704 01/17/19 2108 01/18/19 0730 01/18/19 1110  GLUCAP 160* 147* 97 115* 108*   Lipid Profile: No results for input(s): CHOL, HDL, LDLCALC, TRIG, CHOLHDL, LDLDIRECT in the last 72 hours. Thyroid Function Tests: No results for input(s): TSH, T4TOTAL, FREET4, T3FREE, THYROIDAB in the last 72 hours. Anemia Panel: No results  for input(s): VITAMINB12, FOLATE, FERRITIN, TIBC, IRON, RETICCTPCT in the last 72 hours. Sepsis Labs: Recent Labs  Lab 01/17/19 0225 01/17/19 0928 01/17/19 1138  PROCALCITON  --   --  <0.10  LATICACIDVEN 1.0 1.1  --     Recent Results (from the past 240 hour(s))  Blood Culture (routine x 2)     Status: None (Preliminary result)   Collection Time: 01/17/19  2:30 AM  Result Value Ref Range Status   Specimen Description BLOOD LEFT ARM  Final   Special Requests   Final    BOTTLES DRAWN AEROBIC AND ANAEROBIC Blood Culture adequate volume   Culture   Final    NO GROWTH 1 DAY Performed at [facility], [place] [address], [place], [place] [address]    Report Status PENDING  Incomplete  Blood Culture (routine x 2)     Status: None (Preliminary result)   Collection Time: 01/17/19  2:35 AM  Result Value Ref Range Status  Specimen Description BLOOD RIGHT HAND  Final   Special Requests   Final    BOTTLES DRAWN AEROBIC AND ANAEROBIC Blood Culture adequate volume   Culture   Final    NO GROWTH 1 DAY Performed at [facility], [address], [place], [place] [address]    Report Status PENDING  Incomplete  SARS Coronavirus 2 [facility] order, Performed in [place] hospital lab)     Status: None   Collection Time: 01/17/19  2:39 AM  Result Value Ref Range Status   SARS Coronavirus 2 NEGATIVE NEGATIVE Final    Comment: (NOTE) If result is NEGATIVE SARS-CoV-2 target nucleic acids are NOT DETECTED. The SARS-CoV-2 RNA is generally detectable in upper and lower  respiratory specimens during the acute phase of infection. The lowest  concentration of SARS-CoV-2 viral copies this assay can detect is 250  copies / mL. Cailey Trigueros negative result does not preclude SARS-CoV-2 infection  and should not be used as the sole basis for treatment or other  patient management decisions.  Caeson Filippi negative result may occur with  improper specimen collection / handling, submission of specimen other  than  nasopharyngeal swab, presence of viral mutation(s) within the  areas targeted by this assay, and inadequate number of viral copies  (<250 copies / mL). Camerin Ladouceur negative result must be combined with clinical  observations, patient history, and epidemiological information. If result is POSITIVE SARS-CoV-2 target nucleic acids are DETECTED. The SARS-CoV-2 RNA is generally detectable in upper and lower  respiratory specimens dur ing the acute phase of infection.  Positive  results are indicative of active infection with SARS-CoV-2.  Clinical  correlation with patient history and other diagnostic information is  necessary to determine patient infection status.  Positive results do  not rule out bacterial infection or co-infection with other viruses. If result is PRESUMPTIVE POSTIVE SARS-CoV-2 nucleic acids MAY BE PRESENT.   Cameron Katayama presumptive positive result was obtained on the submitted specimen  and confirmed on repeat testing.  While 2019 novel coronavirus  (SARS-CoV-2) nucleic acids may be present in the submitted sample  additional confirmatory testing may be necessary for epidemiological  and / or clinical management purposes  to differentiate between  SARS-CoV-2 and other Sarbecovirus currently known to infect humans.  If clinically indicated additional testing with an alternate test  methodology [phone]) is advised. The SARS-CoV-2 RNA is generally  detectable in upper and lower respiratory sp ecimens during the acute  phase of infection. The expected result is Negative. Fact Sheet for Patients:  StrictlyIdeas.no Fact Sheet for Healthcare Providers: BankingDealers.co.za This test is not yet approved or cleared by the [place] FDA and has been authorized for detection and/or diagnosis of SARS-CoV-2 by FDA under an Emergency Use Authorization (EUA).  This EUA will remain in effect (meaning this test can be used) for the duration of  the COVID-19 declaration under Section 564(b)(1) of the Act, 21 U.S.C. section 360bbb-3(b)(1), unless the authorization is terminated or revoked sooner. Performed at [facility], [place] [address], [place], [place] [address]   Urine culture     Status: Abnormal   Collection Time: 01/17/19  3:59 AM  Result Value Ref Range Status   Specimen Description URINE, CLEAN CATCH  Final   Special Requests   Final    NONE Performed at [facility], [place] [address] [place], [place] [address]    Culture MULTIPLE SPECIES PRESENT, SUGGEST RECOLLECTION (Shyloh Krinke)  Final   Report Status 01/18/2019 FINAL  Final         Radiology Studies: Ct Chest Wo Contrast  Result Date: 01/17/2019 CLINICAL DATA:  73 y/o F; 3 months of productive cough. History of pneumonia. EXAM: CT CHEST WITHOUT CONTRAST TECHNIQUE: Multidetector CT imaging of the chest was performed following the standard protocol without IV contrast. COMPARISON:  01/17/2019 chest radiograph FINDINGS: Cardiovascular: No significant vascular findings. Normal heart size. No pericardial effusion. Mild aortic and moderate coronary artery calcific atherosclerosis. Single lead AICD noted. Mediastinum/Nodes: No enlarged mediastinal or axillary lymph nodes. Thyroid gland, trachea, and esophagus demonstrate no significant findings. Lungs/Pleura: Dense consolidation within the right upper lobe with surrounding ground-glass opacity along the major and minor fissures. Discrete nodule in the right middle lobe measuring 11 x 18 mm (series 4, image 25). Trace right pleural effusion. No pneumothorax. Upper Abdomen: Cholecystectomy. Moderate hiatal hernia. Musculoskeletal: No fracture is seen. IMPRESSION: 1. Large consolidation within the right upper lobe compatible with pneumonia. No necrosis. 2. Discrete nodule in the right middle lobe measuring up to 11 x 18 mm may be infectious/inflammatory. Follow-up CT chest after resolution of pneumonia is recommended to assess  persistence. 3. Trace right pleural effusion. 4. Mild aortic and moderate coronary artery calcific atherosclerosis. 5. Moderate hiatal hernia. Electronically Signed   By: Kristine Garbe M.D.   On: 01/17/2019 04:46   Ct Chest High Resolution  Result Date: 01/17/2019 CLINICAL DATA:  Persistent cough EXAM: CT CHEST WITHOUT CONTRAST TECHNIQUE: Multidetector CT imaging of the chest was performed following the standard protocol without intravenous contrast. High resolution imaging of the lungs, as well as inspiratory and expiratory imaging, was performed. COMPARISON:  CT chest, 01/17/2019 FINDINGS: Cardiovascular: Scattered coronary artery calcifications. Normal heart size. No pericardial effusion. Mediastinum/Nodes: Enlarged right hilar lymph nodes with prominent although not pathologically enlarged mediastinal lymph nodes. Thyroid gland, trachea, and esophagus demonstrate no significant findings. Lungs/Pleura: No significant change in Sheily Lineman dense, masslike consolidation of the right upper lobe. Redemonstrated discrete pulmonary nodule in the adjacent right middle lobe measuring 1.8 cm (series 8, image 62). Small right pleural effusion. No significant evidence of interstitial lung disease or air trapping. Upper Abdomen: No acute abnormality. Musculoskeletal: No chest wall mass or suspicious bone lesions identified. IMPRESSION: 1. Unchanged CT examination, in comparison to same day prior. No significant change in Siedah Sedor dense, masslike consolidation of the right upper lobe, most consistent with lobar pneumonia, although underlying mass is not strictly excluded. Recommend follow-up at 6-8 weeks to ensure complete resolution. 2. Redemonstrated discrete pulmonary nodule in the adjacent right middle lobe measuring 1.8 cm (series 8, image 62). Given size, this will require follow-up to establish stability, which can be performed in conjunction with above. PET-CT or biopsy may be considered given overall concern for  malignancy. 3.  Small right pleural effusion. 4. No significant evidence of interstitial lung disease or air trapping on this tailored ILD protocol examination. Electronically Signed   By: Eddie Candle M.D.   On: 01/17/2019 13:37   Dg Chest Portable 1 View  Result Date: 01/17/2019 CLINICAL DATA:  73 year old female with shortness of breath and cough. EXAM: PORTABLE CHEST 1 VIEW COMPARISON:  Chest radiograph dated 01/12/2019 FINDINGS: There is Mazi Schuff patchy area of airspace opacity in the right upper lobe which is new since the prior radiograph of 01/12/2019 most consistent with pneumonia. Clinical correlation and follow-up to resolution recommended. There is no pleural effusion or pneumothorax. Stable mild cardiomegaly. Left pectoral AICD device. No acute osseous pathology. IMPRESSION: Right upper lobe pneumonia.  Follow-up to resolution recommended. Electronically Signed   By: Anner Crete M.D.   On: 01/17/2019 02:32   Ct Maxillofacial Wo Contrast  Result Date: 01/17/2019 CLINICAL DATA:  Persistent cough and sinus problems. EXAM: CT MAXILLOFACIAL WITHOUT CONTRAST TECHNIQUE: Multidetector CT images of the paranasal sinuses were obtained using the standard protocol without intravenous contrast. COMPARISON:  None. FINDINGS: Paranasal sinuses: Frontal: Normally aerated. Patent frontal sinus drainage pathways. Ethmoid: Normally aerated. Maxillary: Normally aerated. Sphenoid: Trace mucosal thickening in the region of the sphenoid sinus ostia and sphenoethmoidal recesses, otherwise clear bilaterally. No fluid. Right ostiomeatal unit: Patent. Left ostiomeatal unit: Patent. Nasal passages: Patent. Asymmetric mild right inferior nasal turbinate hypertrophy. Leftward nasal septal deviation/mild spurring contacting the left inferior turbinate. Other: Clear mastoid air cells and tympanic cavities. Bilateral cataract extraction. Grossly unremarkable included portion of the brain. IMPRESSION: No significant paranasal sinus  inflammatory disease. Electronically Signed   By: Logan Bores M.D.   On: 01/17/2019 11:39        Scheduled Meds: . aspirin EC  81 mg Oral Daily  . atorvastatin  80 mg Oral QPM  . carvedilol  25 mg Oral BID WC  . docusate sodium  100 mg Oral BID  . enoxaparin (LOVENOX) injection  100 mg Subcutaneous Q12H  . fluticasone  2 spray Each Nare Daily  . gabapentin  200 mg Oral BID  . insulin aspart  0-15 Units Subcutaneous TID WC  . insulin aspart  0-5 Units Subcutaneous QHS  . insulin aspart protamine- aspart  30 Units Subcutaneous BID WC  . levothyroxine  50 mcg Oral Daily  . loratadine  10 mg Oral Daily  . pantoprazole  40 mg Oral Daily  . sodium chloride flush  3 mL Intravenous Q12H   Continuous Infusions: . azithromycin 500 mg (01/18/19 0212)  . cefTRIAXone (ROCEPHIN)  IV 2 g (01/18/19 0136)  . lactated ringers 100 mL/hr at 01/18/19 0834     LOS: 0 days    Time spent: over 30 min    Fayrene Helper, MD [facility] Pager AMION  If 7PM-7AM, please contact night-coverage [email or web address] Password [email or web address] 01/18/2019, 1:15 PM

## 2019-01-18 NOTE — Plan of Care (Signed)
  Problem: Education:reviewed medication management and disease process, use of respiratory aids  to improve air exchange  Goal: Knowledge of General Education information will improve Description Including pain rating scale, medication(s)/side effects and non-pharmacologic comfort measures Outcome: Progressing   Problem: Health Behavior/Discharge Planning: Goal: Ability to manage health-related needs will improve Outcome: Progressing   Problem: Clinical Measurements: Goal: Respiratory complications will improve Outcome: Progressing

## 2019-01-18 NOTE — Care Management Obs Status (Signed)
[place] NOTIFICATION   Patient Details  Name: Desiree Harrison MRN: [id number] Date of Birth: 09/11/1945   Medicare Observation Status Notification Given:  Yes    Benard Halsted, [place] 01/18/2019, 9:46 AM

## 2019-01-18 NOTE — Progress Notes (Signed)
Hypoglycemic Event  CBG: 64->55->72  Treatment: PB and OJ  Symptoms: None  Follow-up CBG: Time:1802 CBG Result:72  Possible Reasons for Event: not eating as much as breakfast  Comments: CBG found to be 64 at 1656. Patient had been eating PB when CBG was rechecked at 1728, where blood sugar was found to be 55. After 120cc OJ and ~30 minutes, CBG rechecked again and found to be 72. Patient said her supper was on the way soon and was asymptomatic. Will continue to monitor.  Kinnie Scales, RN 01/18/19 6:07 PM

## 2019-01-19 LAB — PROTIME-INR
INR: 1.7 — ABNORMAL HIGH (ref 0.8–1.2)
Prothrombin Time: 19.7 seconds — ABNORMAL HIGH (ref 11.4–15.2)

## 2019-01-19 LAB — MAGNESIUM: Magnesium: 1.8 mg/dL (ref 1.7–2.4)

## 2019-01-19 LAB — COMPREHENSIVE METABOLIC PANEL
ALT: 14 U/L (ref 0–44)
AST: 22 U/L (ref 15–41)
Albumin: 2 g/dL — ABNORMAL LOW (ref 3.5–5.0)
Alkaline Phosphatase: 53 U/L (ref 38–126)
Anion gap: 12 (ref 5–15)
BUN: 14 mg/dL (ref 8–23)
CO2: 25 mmol/L (ref 22–32)
Calcium: 8.1 mg/dL — ABNORMAL LOW (ref 8.9–10.3)
Chloride: 106 mmol/L (ref 98–111)
Creatinine, Ser: 1.15 mg/dL — ABNORMAL HIGH (ref 0.44–1.00)
GFR calc Af Amer: 55 mL/min — ABNORMAL LOW (ref >60)
GFR calc non Af Amer: 48 mL/min — ABNORMAL LOW (ref >60)
Glucose, Bld: 104 mg/dL — ABNORMAL HIGH (ref 70–99)
Potassium: 3.5 mmol/L (ref 3.5–5.1)
Sodium: 143 mmol/L (ref 135–145)
Total Bilirubin: 0.4 mg/dL (ref 0.3–1.2)
Total Protein: 6.3 g/dL — ABNORMAL LOW (ref 6.5–8.1)

## 2019-01-19 LAB — GLUCOSE, CAPILLARY
Glucose-Capillary: 162 mg/dL — ABNORMAL HIGH (ref 70–99)
Glucose-Capillary: 117 mg/dL — ABNORMAL HIGH (ref 70–99)
Glucose-Capillary: 79 mg/dL (ref 70–99)
Glucose-Capillary: 124 mg/dL — ABNORMAL HIGH (ref 70–99)
Glucose-Capillary: 154 mg/dL — ABNORMAL HIGH (ref 70–99)
Glucose-Capillary: 163 mg/dL — ABNORMAL HIGH (ref 70–99)

## 2019-01-19 LAB — CBC
HCT: 33.9 % — ABNORMAL LOW (ref 36.0–46.0)
Hemoglobin: 10.6 g/dL — ABNORMAL LOW (ref 12.0–15.0)
MCH: 25.8 pg — ABNORMAL LOW (ref 26.0–34.0)
MCHC: 31.3 g/dL (ref 30.0–36.0)
MCV: 82.5 fL (ref 80.0–100.0)
Platelets: 220 10*3/uL (ref 150–400)
RBC: 4.11 MIL/uL (ref 3.87–5.11)
RDW: 15.7 % — ABNORMAL HIGH (ref 11.5–15.5)
WBC: 11.4 10*3/uL — ABNORMAL HIGH (ref 4.0–10.5)
nRBC: 0 % (ref 0.0–0.2)

## 2019-01-19 MED ORDER — INSULIN ASPART 100 UNIT/ML ~~LOC~~ SOLN
0.0000 [IU] | Freq: Three times a day (TID) | SUBCUTANEOUS | Status: DC
  Administered 2019-01-19 – 2019-01-21 (×2): 2 [IU] via SUBCUTANEOUS

## 2019-01-19 MED ORDER — HYDROCOD POLST-CPM POLST ER 10-8 MG/5ML PO SUER
5.0000 mL | Freq: Two times a day (BID) | ORAL | Status: DC
  Administered 2019-01-19 – 2019-01-21 (×4): 5 mL via ORAL
  Filled 2019-01-19 (×6): qty 5

## 2019-01-19 MED ORDER — BENZONATATE 100 MG PO CAPS
200.0000 mg | ORAL_CAPSULE | Freq: Two times a day (BID) | ORAL | Status: DC | PRN
  Administered 2019-01-19 – 2019-01-20 (×3): 200 mg via ORAL
  Filled 2019-01-19 (×5): qty 2

## 2019-01-19 MED ORDER — INSULIN ASPART 100 UNIT/ML ~~LOC~~ SOLN
0.0000 [IU] | Freq: Every day | SUBCUTANEOUS | Status: DC
  Administered 2019-01-20: 4 [IU] via SUBCUTANEOUS

## 2019-01-19 MED ORDER — INSULIN NPH (HUMAN) (ISOPHANE) 100 UNIT/ML ~~LOC~~ SUSP
12.0000 [IU] | Freq: Two times a day (BID) | SUBCUTANEOUS | Status: DC
  Administered 2019-01-19 – 2019-01-20 (×2): 12 [IU] via SUBCUTANEOUS
  Filled 2019-01-19: qty 10

## 2019-01-19 NOTE — Progress Notes (Addendum)
NAME:  Desiree Harrison, MRN:  [id number], DOB:  12/16/1945, LOS: 1 ADMISSION DATE:  01/17/2019, CONSULTATION DATE:  01/17/2019 REFERRING MD:  Thomasenia Bottoms MD, CHIEF COMPLAINT: Abnormal CT scan  Brief History   73 year old F admitted 5/9 with chronic cough, fever.  She had been treated intermittently for pneumonia for the past several months.  Initially diagnosed in November 2019 and treated with multiple rounds of antibiotic.  Noted to have right upper lobe pneumonia on chest x-ray on 11/12/2018.  Seen in pulmonary clinic recently but was unable to get a CT due to COVID restrictions. Admitted to the hospital on 5/9 with CT showing right upper lobe consolidation. She has minimal smoking history in college.  Quit about 50 years ago.  No exposure history.  Past Medical History  Ventricular aneurysm on Coumadin, diabetes, coronary artery disease status post AICD, hypothyroidism  Significant Hospital Events   5/09 Admit  Consults:  PCCM  Procedures:  CT chest 01/17/2019 >> large consolidation in the right upper lobe, right middle lobe nodule measuring 18 mm.  No evidence of mediastinal or hilar lymphadenopathy.    Significant Diagnostic Tests:    Micro Data:  SARS COV2 5/9 >> negative  Antimicrobials:  Azithromycin 5/9 >> Ceftriaxone 5/9 >>  Interim history/subjective:  Pt reports ongoing dry, non-productive cough.  Anxious to get procedure scheduled. Afebrile.  No acute events overnight.   Objective   Blood pressure 122/64, pulse 78, temperature 98.3 F (36.8 C), temperature source Oral, resp. rate 12, SpO2 95 %.        Intake/Output Summary (Last 24 hours) at 01/19/2019 0803 Last data filed at 01/18/2019 1700 Gross per 24 hour  Intake 240 ml  Output -  Net 240 ml   There were no vitals filed for this visit.  Examination: General:  Adult female sitting on side of bed in NAD HEENT: MM pink/moist, no jvd Neuro: AAOx4, MAE, non-focal  CV: s1s2 rrr, no m/r/g PULM: even/non-labored, lungs  bilaterally clear, harsh barking cough QP:RFFM, non-tender, bsx4 active  Extremities: warm/dry, no edema  Skin: no rashes or lesions  Resolved Hospital Problem list     Assessment & Plan:  73 year old with recurrent pneumonias since November 2019, persistent right upper lobe opacity CT today reviewed with right upper lobe consolidation.  Differential diagnosis includes recurrent pneumonia, malignancy with postobstructive infection, noninfectious process such as eosinophilic pneumonia, COP  RUL Opacity  Cough P: Continue empiric abx  Hold coumadin / anticoagulation until post procedure  Plan for FOB 5/12 at 12:30  Follow up INR in am, <1.5 target for procedure Consent of patient per MD Tussionex for cough BID  NPO after MN     Labs   CBC: Recent Labs  Lab 01/12/19 0921 01/17/19 0230 01/18/19 0544 01/19/19 0603  WBC 9.3 11.6* 11.1* 11.4*  NEUTROABS 5.6 8.3*  --   --   HGB 13.2 12.5 10.7* 10.6*  HCT 41.0 38.9 34.1* 33.9*  MCV 81.9 81.9 82.4 82.5  PLT 178.0 205 219 384    Basic Metabolic Panel: Recent Labs  Lab 01/17/19 0230 01/18/19 0544 01/19/19 0603  NA 141 140 143  K 3.8 3.5 3.5  CL 104 104 106  CO2 24 25 25   GLUCOSE 179* 137* 104*  BUN 16 15 14   CREATININE 1.17* 1.19* 1.15*  CALCIUM 8.7* 8.0* 8.1*  MG  --   --  1.8   GFR: Estimated Creatinine Clearance: 54.1 mL/min (A) (by C-G formula based on SCr of 1.15 mg/dL (H)).  Recent Labs  Lab 01/12/19 0921 01/17/19 0225 01/17/19 0230 01/17/19 0928 01/17/19 1138 01/18/19 0544 01/19/19 0603  PROCALCITON  --   --   --   --  <0.10  --   --   WBC 9.3  --  11.6*  --   --  11.1* 11.4*  LATICACIDVEN  --  1.0  --  1.1  --   --   --     Liver Function Tests: Recent Labs  Lab 01/17/19 0230 01/19/19 0603  AST 28 22  ALT 18 14  ALKPHOS 70 53  BILITOT 0.9 0.4  PROT 7.8 6.3*  ALBUMIN 2.7* 2.0*   No results for input(s): LIPASE, AMYLASE in the last 168 hours. No results for input(s): AMMONIA in the last  168 hours.  ABG No results found for: PHART, PCO2ART, PO2ART, HCO3, TCO2, ACIDBASEDEF, O2SAT   Coagulation Profile: Recent Labs  Lab 01/17/19 0230 01/18/19 0722 01/19/19 0603  INR 2.0* 1.9* 1.7*    Cardiac Enzymes: No results for input(s): CKTOTAL, CKMB, CKMBINDEX, TROPONINI in the last 168 hours.  HbA1C: Hgb A1c MFr Bld  Date/Time Value Ref Range Status  11/24/2018 11:24 AM 9.0 (H) 4.8 - 5.6 % Final    Comment:             Prediabetes: 5.7 - 6.4          Diabetes: >6.4          Glycemic control for adults with diabetes: <7.0   08/19/2018 09:19 AM 13.4 (H) 4.8 - 5.6 % Final    Comment:             Prediabetes: 5.7 - 6.4          Diabetes: >6.4          Glycemic control for adults with diabetes: <7.0     CBG: Recent Labs  Lab 01/18/19 1656 01/18/19 1728 01/18/19 1802 01/18/19 2201 01/19/19 [place], NP-C [facility] Pgr: [phone] or if no answer [phone] 01/19/2019, 8:03 AM

## 2019-01-19 NOTE — Progress Notes (Signed)
PROGRESS NOTE    [name]  JOI:[id number] DOB: 16-Feb-1946 DOA: 01/17/2019 PCP: Scot Jun, FNP  Brief Narrative:  Desiree Harrison is Desiree Harrison 73 y.o. 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  She has been coughing with fever.  She has been dealing with PNA off and on for 4 months.  She had abx in November and other times including recently.  She has taken Amoxicillin, Levaquin, and Prednisone.  Her last course of Levaquin was 1-2 weeks ago.  She has never felt bad but has had the same aggravating cough.  No SOB.  No sick contacts.  Assessment & Plan:   Principal Problem:   Sepsis due to pneumonia [facility]) Active Problems:   Chronic combined systolic and diastolic heart failure (HCC)   Diabetes mellitus ([place])   Essential hypertension   Hypothyroidism   Hyperlipidemia   ICD (implantable cardioverter-defibrillator) in place   Long term (current) use of anticoagulants   Pneumonia  Sepsis due to PNA - Recurrent fever last night - CT scan with consolidation of RUL.  F/u recommended at 6-8 weeks to ensure resolution.  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  Consider PET-CT or biopsy.  DM -A1c was 9.0 on 3/16, indicating suboptimal glycemic control -She is on 50/50 insulin at home with 50 units BID -Will change to NPH 15 units BID.  D/c mealtime with hypoglycemia. -Cover with moderate-scale SSI  HTN -Continue Coreg -Hold other medications due to borderline hypotension on presentation, including Norvasc, Cozaar (not taking)  HLD -Continue Lipitor  Hypothyroidism -Normal thyroid studies in 12/19 -Continue Synthroid at current dose for now  DVT prophylaxis: scd, warfarin currently on hold Code Status: full  Family Communication: none at bedside - daughter on phone Disposition Plan: pending.  Inpatient given recurrent pneumonia with plan for bronchoscopy 5/12 requiring continued IV abx.  Consultants:   pulmonology  Procedures:   none  Antimicrobials: Anti-infectives (From admission, onward)   Start     Dose/Rate Route Frequency Ordered Stop   01/17/19 0200  cefTRIAXone (ROCEPHIN) 2 g in sodium chloride 0.9 % 100 mL IVPB     2 g 200 mL/hr over 30 Minutes Intravenous Every 24 hours 01/17/19 0155     01/17/19 0200  azithromycin (ZITHROMAX) 500 mg in sodium chloride 0.9 % 250 mL IVPB     500 mg 250 mL/hr over 60 Minutes Intravenous Every 24 hours 01/17/19 0155        Subjective: Feels ok.  Fever overnight.  Daughter on phone.  Objective: Vitals:   01/18/19 2000 01/18/19 2107 01/18/19 2347 01/19/19 0830  BP:  116/61  122/64 129/68  Pulse: 92 76 78 80  Resp: 16 16 12 16   Temp: (!) 103 F (39.4 C)  98.3 F (36.8 C) 98.3 F (36.8 C)  TempSrc: Oral  Oral Oral  SpO2:  92% 95% 94%    Intake/Output Summary (Last 24 hours) at 01/19/2019 1135 Last data filed at 01/18/2019 1700 Gross per 24 hour  Intake 240 ml  Output -  Net 240 ml   There were no vitals filed for  this visit.  Examination:  General: No acute distress. Cardiovascular: Heart sounds show Mazella Deen regular rate, and rhythm Lungs: Clear to auscultation bilaterally  Abdomen: Soft, nontender, nondistended  Neurological: Alert and oriented 3. Moves all extremities 4. Cranial nerves II through XII grossly intact. Skin: Warm and dry. No rashes or lesions. Extremities: No clubbing or cyanosis. No edema.  Psychiatric: Mood and affect are normal. Insight and judgment are appropriate.    Data Reviewed: I have personally reviewed following labs and imaging studies  CBC: Recent Labs  Lab 01/17/19 0230 01/18/19 0544 01/19/19 0603  WBC 11.6* 11.1* 11.4*  NEUTROABS 8.3*  --   --   HGB 12.5 10.7* 10.6*  HCT 38.9 34.1* 33.9*  MCV 81.9 82.4 82.5  PLT 205 219 938   Basic Metabolic Panel: Recent Labs  Lab 01/17/19 0230 01/18/19 0544 01/19/19 0603  NA 141 140 143  K 3.8 3.5 3.5  CL 104 104 106  CO2 24 25 25   GLUCOSE 179* 137* 104*  BUN 16 15 14   CREATININE 1.17* 1.19* 1.15*  CALCIUM 8.7* 8.0* 8.1*  MG  --   --  1.8   GFR: Estimated Creatinine Clearance: 54.1 mL/min (Rudy Luhmann) (by C-G formula based on SCr of 1.15 mg/dL (H)). Liver Function Tests: Recent Labs  Lab 01/17/19 0230 01/19/19 0603  AST 28 22  ALT 18 14  ALKPHOS 70 53  BILITOT 0.9 0.4  PROT 7.8 6.3*  ALBUMIN 2.7* 2.0*   No results for input(s): LIPASE, AMYLASE in the last 168 hours. No results for input(s): AMMONIA in the last 168 hours. Coagulation Profile: Recent Labs  Lab 01/17/19 0230 01/18/19 0722 01/19/19 0603  INR 2.0* 1.9* 1.7*   Cardiac Enzymes: No results for input(s): CKTOTAL, CKMB, CKMBINDEX, TROPONINI in the last 168 hours. BNP (last 3 results) Recent Labs    01/12/19 0921  PROBNP 57.0   HbA1C: No results for input(s): HGBA1C in the last 72 hours. CBG: Recent Labs  Lab 01/18/19 1728 01/18/19 1802 01/18/19 2201 01/19/19 0220 01/19/19 0848  GLUCAP 55* 72 135* 117* 79   Lipid Profile: No  results for input(s): CHOL, HDL, LDLCALC, TRIG, CHOLHDL, LDLDIRECT in the last 72 hours. Thyroid Function Tests: No results for input(s): TSH, T4TOTAL, FREET4, T3FREE, THYROIDAB in the last 72 hours. Anemia Panel: No results for input(s): VITAMINB12, FOLATE, FERRITIN, TIBC, IRON, RETICCTPCT in the last 72 hours. Sepsis Labs: Recent Labs  Lab 01/17/19 0225 01/17/19 0928 01/17/19 1138  PROCALCITON  --   --  <0.10  LATICACIDVEN 1.0 1.1  --     Recent Results (from the past 240 hour(s))  Blood Culture (routine x 2)     Status: None (Preliminary result)   Collection Time: 01/17/19  2:30 AM  Result Value Ref Range Status   Specimen Description BLOOD LEFT ARM  Final   Special Requests   Final    BOTTLES DRAWN AEROBIC AND ANAEROBIC Blood Culture adequate volume   Culture   Final    NO GROWTH  2 DAYS Performed at [facility], [place] [address] [place], [place] [address]    Report Status PENDING  Incomplete  Blood Culture (routine x 2)     Status: None (Preliminary result)   Collection Time: 01/17/19  2:35 AM  Result Value Ref Range Status   Specimen Description BLOOD RIGHT HAND  Final   Special Requests   Final    BOTTLES DRAWN AEROBIC AND ANAEROBIC Blood Culture adequate volume   Culture   Final    NO GROWTH 2 DAYS Performed at [facility], [place] [address] [place], [place] [address]    Report Status PENDING  Incomplete  SARS Coronavirus 2 [facility] order, Performed in [place] hospital lab)     Status: None   Collection Time: 01/17/19  2:39 AM  Result Value Ref Range Status   SARS Coronavirus 2 NEGATIVE NEGATIVE Final    Comment: (NOTE) If result is NEGATIVE SARS-CoV-2 target nucleic acids are NOT DETECTED. The SARS-CoV-2 RNA is generally detectable in upper and lower  respiratory specimens during the acute phase of infection. The lowest  concentration of SARS-CoV-2 viral copies this assay can detect is 250  copies / mL. Zakiyyah Savannah negative result does not preclude  SARS-CoV-2 infection  and should not be used as the sole basis for treatment or other  patient management decisions.  Breea Loncar negative result may occur with  improper specimen collection / handling, submission of specimen other  than nasopharyngeal swab, presence of viral mutation(s) within the  areas targeted by this assay, and inadequate number of viral copies  (<250 copies / mL). Arielle Eber negative result must be combined with clinical  observations, patient history, and epidemiological information. If result is POSITIVE SARS-CoV-2 target nucleic acids are DETECTED. The SARS-CoV-2 RNA is generally detectable in upper and lower  respiratory specimens dur ing the acute phase of infection.  Positive  results are indicative of active infection with SARS-CoV-2.  Clinical  correlation with patient history and other diagnostic information is  necessary to determine patient infection status.  Positive results do  not rule out bacterial infection or co-infection with other viruses. If result is PRESUMPTIVE POSTIVE SARS-CoV-2 nucleic acids MAY BE PRESENT.   Tondalaya Perren presumptive positive result was obtained on the submitted specimen  and confirmed on repeat testing.  While 2019 novel coronavirus  (SARS-CoV-2) nucleic acids may be present in the submitted sample  additional confirmatory testing may be necessary for epidemiological  and / or clinical management purposes  to differentiate between  SARS-CoV-2 and other Sarbecovirus currently known to infect humans.  If clinically indicated additional testing with an alternate test  methodology [phone]) is advised. The SARS-CoV-2 RNA is generally  detectable in upper and lower respiratory sp ecimens during the acute  phase of infection. The expected result is Negative. Fact Sheet for Patients:  StrictlyIdeas.no Fact Sheet for Healthcare Providers: BankingDealers.co.za This test is not yet approved or cleared by the  [place] FDA and has been authorized for detection and/or diagnosis of SARS-CoV-2 by FDA under an Emergency Use Authorization (EUA).  This EUA will remain in effect (meaning this test can be used) for the duration of the COVID-19 declaration under Section 564(b)(1) of the Act, 21 U.S.C. section 360bbb-3(b)(1), unless the authorization is terminated or revoked sooner. Performed at [facility], [place] [address], [place], [place] [address]   Urine culture     Status: Abnormal   Collection Time: 01/17/19  3:59 AM  Result Value Ref  Range Status   Specimen Description URINE, CLEAN CATCH  Final   Special Requests   Final    NONE Performed at [facility], [place] [address], [place], [place] [address]    Culture MULTIPLE SPECIES PRESENT, SUGGEST RECOLLECTION (Cliff Damiani)  Final   Report Status 01/18/2019 FINAL  Final         Radiology Studies: No results found.      Scheduled Meds: . aspirin EC  81 mg Oral Daily  . atorvastatin  80 mg Oral QPM  . carvedilol  25 mg Oral BID WC  . chlorpheniramine-HYDROcodone  5 mL Oral Q12H  . docusate sodium  100 mg Oral BID  . fluticasone  2 spray Each Nare Daily  . gabapentin  200 mg Oral BID  . insulin aspart  0-15 Units Subcutaneous TID WC  . insulin aspart  0-5 Units Subcutaneous QHS  . insulin NPH Human  15 Units Subcutaneous BID AC & HS  . levothyroxine  50 mcg Oral Daily  . loratadine  10 mg Oral Daily  . pantoprazole  40 mg Oral Daily  . sodium chloride flush  3 mL Intravenous Q12H   Continuous Infusions: . azithromycin 500 mg (01/19/19 0254)  . cefTRIAXone (ROCEPHIN)  IV 2 g (01/19/19 0158)  . lactated ringers 100 mL/hr at 01/18/19 2336     LOS: 1 day    Time spent: over 30 min    Fayrene Helper, MD [facility] Pager AMION  If 7PM-7AM, please contact night-coverage [email or web address] Password [facility] 01/19/2019, 11:35 AM

## 2019-01-19 NOTE — Progress Notes (Signed)
ANTICOAGULATION CONSULT NOTE - Initial Consult  Pharmacy Consult for Warfarin Indication: ventricular aneurysm  Allergies  Allergen Reactions  . Lisinopril Cough   Patient Measurements:   Height: 5' 7.5" Weight 96.9 kg  Vital Signs: Temp: 98.3 F (36.8 C) (05/11 0830) Temp Source: Oral (05/11 0830) BP: 129/68 (05/11 0830) Pulse Rate: 80 (05/11 0830)  Labs: Recent Labs    01/17/19 0230 01/18/19 0544 01/18/19 0722 01/19/19 0603  HGB 12.5 10.7*  --  10.6*  HCT 38.9 34.1*  --  33.9*  PLT 205 219  --  220  LABPROT 22.4*  --  21.7* 19.7*  INR 2.0*  --  1.9* 1.7*  CREATININE 1.17* 1.19*  --  1.15*   Estimated Creatinine Clearance: 54.1 mL/min (A) (by C-G formula based on SCr of 1.15 mg/dL (H)).  Medical History: Past Medical History:  Diagnosis Date  . Acute systolic heart failure ([place])   . CAD in native artery   . Cardiomyopathy ([place])   . Long term (current) use of anticoagulants   . S/P internal cardiac defibrillator procedure    placed 10/28/15  . STEMI (ST elevation myocardial infarction) ([place])   . Type 2 diabetes mellitus, with long-term current use of insulin ([place])   . Ventricular aneurysm     Medications:  PTA warfarin 4.5 mg on Wed/Fri/Sun, 3mg  all other days of the week, LD 5/7 at 1800   Assessment: 72 y/oF admitted on 01/17/19 with fever, SOB, cough, failed outpatient treatment for PNA. CT chest with large consolidation within RUL compatible with pneumonia. 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 Pharmacy consulted for warfarin dosing while patient admitted. 5/9 INR = 2. Hgb, Pltc WNL. On ASA 81mg  daily as per PTA regimen. No bleeding issues reported. On broad spectrum antibiotics, which can increase INR.    Goal of Therapy:  INR 2-3 Monitor platelets by anticoagulation protocol: Yes   5/9: PCCM planning for bronchoscopy on 5/11. Per Dr. Vaughan Browner, hold warfarin for now. Bridging with lovenox 1mg /kg SQ q12h-last dose 5/10 am. No LMWH  needed per Cards  5/10: Lovenox d/c 5/11: Bronchoscopy postponed to 5/12, continue to hold anti-coag till after procedure   Plan:   No Warfarin today  Continue Daily PT/INR  Monitor CBC and for s/sx of bleeding   Minda Ditto PharmD [phone] till 4p

## 2019-01-19 NOTE — Progress Notes (Signed)
Inpatient Diabetes Program Recommendations  AACE/ADA: New Consensus Statement on Inpatient Glycemic Control (2015)  Target Ranges:  Prepandial:   less than 140 mg/dL      Peak postprandial:   less than 180 mg/dL (1-2 hours)      Critically ill patients:  140 - 180 mg/dL   Lab Results  Component Value Date   GLUCAP 79 01/19/2019   HGBA1C 9.0 (H) 11/24/2018    Review of Glycemic Control Results for Desiree Harrison, Desiree Harrison (MRN [id number]) as of 01/19/2019 11:56  Ref. Range 01/18/2019 11:10 01/18/2019 16:56 01/18/2019 17:28 01/18/2019 18:02 01/18/2019 22:01 01/19/2019 02:20 01/19/2019 08:48  Glucose-Capillary Latest Ref Range: 70 - 99 mg/dL 108 (H) 64 (L) 55 (L) 72 135 (H) 117 (H) 79   Diabetes history: DM1 Outpatient Diabetes medications: Novolog 50/50 insulin mix 50 units bid + Humalog 10 units tid meal coverage Current orders for Inpatient glycemic control: NPH 15 units bid + Moderate correction scale tid + hs  Inpatient Diabetes Program Recommendations:   Noted patient is scheduled for bronchoscopy in am. Please consider while in the hospital and NPO: -Levemir 12 units bid -Decrease Novolog correction to sensitive 0-9 units tid + hs 0-5 units  Thank you, Bethena Roys E. Eluzer Howdeshell, RN, MSN, CDE  Diabetes Coordinator Inpatient Glycemic Control Team Team Pager [phone] (8am-5pm) 01/19/2019 11:59 AM

## 2019-01-20 ENCOUNTER — Inpatient Hospital Stay (HOSPITAL_COMMUNITY): Payer: Medicare Other | Admitting: Certified Registered"

## 2019-01-20 ENCOUNTER — Inpatient Hospital Stay (HOSPITAL_COMMUNITY): Payer: Medicare Other

## 2019-01-20 ENCOUNTER — Encounter (HOSPITAL_COMMUNITY): Payer: Self-pay | Admitting: Certified Registered"

## 2019-01-20 ENCOUNTER — Encounter (HOSPITAL_COMMUNITY)
Admission: EM | Disposition: A | Discharge status: Home / Self Care | Payer: Self-pay | Source: Home / Self Care | Attending: Family Medicine

## 2019-01-20 HISTORY — PX: VIDEO BRONCHOSCOPY WITH ENDOBRONCHIAL ULTRASOUND: SHX6177

## 2019-01-20 LAB — CBC
HCT: 32.8 % — ABNORMAL LOW (ref 36.0–46.0)
Hemoglobin: 10.3 g/dL — ABNORMAL LOW (ref 12.0–15.0)
MCH: 25.6 pg — ABNORMAL LOW (ref 26.0–34.0)
MCHC: 31.4 g/dL (ref 30.0–36.0)
MCV: 81.6 fL (ref 80.0–100.0)
Platelets: 251 10*3/uL (ref 150–400)
RBC: 4.02 MIL/uL (ref 3.87–5.11)
RDW: 15.5 % (ref 11.5–15.5)
WBC: 9.9 10*3/uL (ref 4.0–10.5)
nRBC: 0 % (ref 0.0–0.2)

## 2019-01-20 LAB — BASIC METABOLIC PANEL
Anion gap: 12 (ref 5–15)
BUN: 11 mg/dL (ref 8–23)
CO2: 24 mmol/L (ref 22–32)
Calcium: 8 mg/dL — ABNORMAL LOW (ref 8.9–10.3)
Chloride: 105 mmol/L (ref 98–111)
Creatinine, Ser: 0.95 mg/dL (ref 0.44–1.00)
GFR calc Af Amer: >60 mL/min (ref >60)
GFR calc non Af Amer: >60 mL/min (ref >60)
Glucose, Bld: 119 mg/dL — ABNORMAL HIGH (ref 70–99)
Potassium: 3.2 mmol/L — ABNORMAL LOW (ref 3.5–5.1)
Sodium: 141 mmol/L (ref 135–145)

## 2019-01-20 LAB — GLUCOSE, CAPILLARY
Glucose-Capillary: 110 mg/dL — ABNORMAL HIGH (ref 70–99)
Glucose-Capillary: 102 mg/dL — ABNORMAL HIGH (ref 70–99)
Glucose-Capillary: 87 mg/dL (ref 70–99)
Glucose-Capillary: 103 mg/dL — ABNORMAL HIGH (ref 70–99)
Glucose-Capillary: 306 mg/dL — ABNORMAL HIGH (ref 70–99)

## 2019-01-20 LAB — PROTIME-INR
INR: 1.4 — ABNORMAL HIGH (ref 0.8–1.2)
Prothrombin Time: 17.1 seconds — ABNORMAL HIGH (ref 11.4–15.2)

## 2019-01-20 SURGERY — BRONCHOSCOPY, WITH EBUS
Anesthesia: General

## 2019-01-20 MED ORDER — WARFARIN SODIUM 4 MG PO TABS
4.0000 mg | ORAL_TABLET | Freq: Once | ORAL | Status: AC
  Administered 2019-01-20: 4 mg via ORAL
  Filled 2019-01-20: qty 1

## 2019-01-20 MED ORDER — EPINEPHRINE PF 1 MG/ML IJ SOLN
INTRAMUSCULAR | Status: DC | PRN
  Administered 2019-01-20: 1 mg via ENDOTRACHEOPULMONARY

## 2019-01-20 MED ORDER — PHENYLEPHRINE 40 MCG/ML (10ML) SYRINGE FOR IV PUSH (FOR BLOOD PRESSURE SUPPORT)
PREFILLED_SYRINGE | INTRAVENOUS | Status: AC
  Filled 2019-01-20: qty 10

## 2019-01-20 MED ORDER — LIDOCAINE 2% (20 MG/ML) 5 ML SYRINGE
INTRAMUSCULAR | Status: AC
  Filled 2019-01-20: qty 10

## 2019-01-20 MED ORDER — POTASSIUM CHLORIDE CRYS ER 20 MEQ PO TBCR
40.0000 meq | EXTENDED_RELEASE_TABLET | Freq: Once | ORAL | Status: AC
  Administered 2019-01-20: 40 meq via ORAL
  Filled 2019-01-20: qty 2

## 2019-01-20 MED ORDER — EPINEPHRINE PF 1 MG/ML IJ SOLN
INTRAMUSCULAR | Status: AC
  Filled 2019-01-20: qty 1

## 2019-01-20 MED ORDER — PROPOFOL 10 MG/ML IV BOLUS
INTRAVENOUS | Status: DC | PRN
  Administered 2019-01-20: 100 mg via INTRAVENOUS

## 2019-01-20 MED ORDER — SUGAMMADEX SODIUM 200 MG/2ML IV SOLN
INTRAVENOUS | Status: DC | PRN
  Administered 2019-01-20: 400 mg via INTRAVENOUS

## 2019-01-20 MED ORDER — ONDANSETRON HCL 4 MG/2ML IJ SOLN
INTRAMUSCULAR | Status: DC | PRN
  Administered 2019-01-20: 4 mg via INTRAVENOUS

## 2019-01-20 MED ORDER — SUCCINYLCHOLINE CHLORIDE 200 MG/10ML IV SOSY
PREFILLED_SYRINGE | INTRAVENOUS | Status: DC | PRN
  Administered 2019-01-20: 100 mg via INTRAVENOUS

## 2019-01-20 MED ORDER — SODIUM CHLORIDE 0.9 % IR SOLN
Status: DC | PRN
  Administered 2019-01-20: 1000 mL

## 2019-01-20 MED ORDER — SUGAMMADEX SODIUM 500 MG/5ML IV SOLN
INTRAVENOUS | Status: AC
  Filled 2019-01-20: qty 5

## 2019-01-20 MED ORDER — SUCCINYLCHOLINE CHLORIDE 200 MG/10ML IV SOSY
PREFILLED_SYRINGE | INTRAVENOUS | Status: AC
  Filled 2019-01-20: qty 20

## 2019-01-20 MED ORDER — LIDOCAINE 2% (20 MG/ML) 5 ML SYRINGE
INTRAMUSCULAR | Status: DC | PRN
  Administered 2019-01-20: 60 mg via INTRAVENOUS

## 2019-01-20 MED ORDER — PHENYLEPHRINE HCL (PRESSORS) 10 MG/ML IV SOLN
INTRAVENOUS | Status: AC
  Filled 2019-01-20: qty 1

## 2019-01-20 MED ORDER — PROPOFOL 10 MG/ML IV BOLUS
INTRAVENOUS | Status: AC
  Filled 2019-01-20: qty 20

## 2019-01-20 MED ORDER — FENTANYL CITRATE (PF) 250 MCG/5ML IJ SOLN
INTRAMUSCULAR | Status: DC | PRN
  Administered 2019-01-20: 100 ug via INTRAVENOUS

## 2019-01-20 MED ORDER — DEXAMETHASONE SODIUM PHOSPHATE 10 MG/ML IJ SOLN
INTRAMUSCULAR | Status: DC | PRN
  Administered 2019-01-20: 5 mg via INTRAVENOUS

## 2019-01-20 MED ORDER — WARFARIN - PHARMACIST DOSING INPATIENT: Freq: Every day | Status: DC

## 2019-01-20 MED ORDER — FENTANYL CITRATE (PF) 250 MCG/5ML IJ SOLN
INTRAMUSCULAR | Status: AC
  Filled 2019-01-20: qty 5

## 2019-01-20 MED ORDER — SODIUM CHLORIDE 0.9 % IV SOLN
INTRAVENOUS | Status: DC
  Administered 2019-01-20: 12:00:00 via INTRAVENOUS

## 2019-01-20 MED ORDER — ROCURONIUM BROMIDE 10 MG/ML (PF) SYRINGE
PREFILLED_SYRINGE | INTRAVENOUS | Status: DC | PRN
  Administered 2019-01-20: 40 mg via INTRAVENOUS

## 2019-01-20 MED ORDER — ONDANSETRON HCL 4 MG/2ML IJ SOLN
INTRAMUSCULAR | Status: AC
  Filled 2019-01-20: qty 4

## 2019-01-20 MED ORDER — SODIUM CHLORIDE 0.9 % IV SOLN
INTRAVENOUS | Status: DC | PRN
  Administered 2019-01-20: 40 ug/min via INTRAVENOUS

## 2019-01-20 MED ORDER — DEXAMETHASONE SODIUM PHOSPHATE 10 MG/ML IJ SOLN
INTRAMUSCULAR | Status: AC
  Filled 2019-01-20: qty 2

## 2019-01-20 MED ORDER — 0.9 % SODIUM CHLORIDE (POUR BTL) OPTIME
TOPICAL | Status: DC | PRN
  Administered 2019-01-20: 13:00:00 1000 mL

## 2019-01-20 MED ORDER — MIDAZOLAM HCL 2 MG/2ML IJ SOLN
INTRAMUSCULAR | Status: AC
  Filled 2019-01-20: qty 2

## 2019-01-20 MED ORDER — ACETAMINOPHEN 500 MG PO TABS
ORAL_TABLET | ORAL | Status: AC
  Administered 2019-01-20: 650 mg via ORAL
  Filled 2019-01-20: qty 2

## 2019-01-20 MED ORDER — HYDROCOD POLST-CPM POLST ER 10-8 MG/5ML PO SUER
5.0000 mL | Freq: Once | ORAL | Status: AC
  Administered 2019-01-20: 5 mL via ORAL

## 2019-01-20 SURGICAL SUPPLY — 31 items
ADAPTER VALVE BIOPSY EBUS (MISCELLANEOUS) IMPLANT
ADPTR VALVE BIOPSY EBUS (MISCELLANEOUS)
BRUSH CYTOL CELLEBRITY 1.5X140 (MISCELLANEOUS) ×2 IMPLANT
CANISTER SUCT 3000ML PPV (MISCELLANEOUS) ×2 IMPLANT
CONT SPEC 4OZ CLIKSEAL STRL BL (MISCELLANEOUS) ×4 IMPLANT
COVER BACK TABLE 60X90IN (DRAPES) ×2 IMPLANT
COVER DOME SNAP 22 D (MISCELLANEOUS) ×2 IMPLANT
FILTER STRAW FLUID ASPIR (MISCELLANEOUS) IMPLANT
FORCEPS BIOP RJ4 1.8 (CUTTING FORCEPS) ×2 IMPLANT
GAUZE SPONGE 4X4 12PLY STRL (GAUZE/BANDAGES/DRESSINGS) ×2 IMPLANT
GLOVE BIO SURGEON STRL SZ7.5 (GLOVE) ×2 IMPLANT
GOWN STRL REUS W/ TWL LRG LVL3 (GOWN DISPOSABLE) ×2 IMPLANT
GOWN STRL REUS W/TWL LRG LVL3 (GOWN DISPOSABLE) ×2
KIT CLEAN ENDO COMPLIANCE (KITS) ×4 IMPLANT
KIT TURNOVER KIT B (KITS) ×2 IMPLANT
MARKER SKIN DUAL TIP RULER LAB (MISCELLANEOUS) ×2 IMPLANT
NEEDLE ASPIRATION VIZISHOT 21G (NEEDLE) ×2 IMPLANT
NS IRRIG 1000ML POUR BTL (IV SOLUTION) ×2 IMPLANT
OIL SILICONE PENTAX (PARTS (SERVICE/REPAIRS)) ×2 IMPLANT
PAD ARMBOARD 7.5X6 YLW CONV (MISCELLANEOUS) ×4 IMPLANT
SYR 20CC LL (SYRINGE) ×2 IMPLANT
SYR 20ML ECCENTRIC (SYRINGE) ×6 IMPLANT
SYR 3ML LL SCALE MARK (SYRINGE) ×2 IMPLANT
SYR 5ML LUER SLIP (SYRINGE) ×2 IMPLANT
TOWEL OR 17X24 6PK STRL BLUE (TOWEL DISPOSABLE) ×2 IMPLANT
TRAP SPECIMEN MUCOUS 40CC (MISCELLANEOUS) ×4 IMPLANT
TUBE CONNECTING 20X1/4 (TUBING) ×2 IMPLANT
VALVE BIOPSY  SINGLE USE (MISCELLANEOUS) ×1
VALVE BIOPSY SINGLE USE (MISCELLANEOUS) ×1 IMPLANT
VALVE SUCTION BRONCHIO DISP (MISCELLANEOUS) ×2 IMPLANT
WATER STERILE IRR 1000ML POUR (IV SOLUTION) ×2 IMPLANT

## 2019-01-20 NOTE — Anesthesia Postprocedure Evaluation (Signed)
Anesthesia Post Note  Patient: Desiree Harrison  Procedure(s) Performed: VIDEO BRONCHOSCOPY WITH BIOPSY (N/A )     Patient location during evaluation: PACU Anesthesia Type: General Level of consciousness: awake and alert Pain management: pain level controlled Vital Signs Assessment: post-procedure vital signs reviewed and stable Respiratory status: spontaneous breathing, nonlabored ventilation, respiratory function stable and patient connected to nasal cannula oxygen Cardiovascular status: blood pressure returned to baseline and stable Postop Assessment: no apparent nausea or vomiting Anesthetic complications: no    Last Vitals:  Vitals:   01/20/19 1420 01/20/19 1435  BP: 100/61 102/71  Pulse: 80 77  Resp: 20 14  Temp:  36.4 C  SpO2: 99% 95%    Last Pain:  Vitals:   01/20/19 1435  TempSrc:   PainSc: 0-No pain                 Kohle Winner,W. EDMOND

## 2019-01-20 NOTE — Anesthesia Preprocedure Evaluation (Addendum)
Anesthesia Evaluation  Patient identified by MRN, date of birth, ID band Patient awake    Reviewed: Allergy & Precautions, H&P , NPO status , Patient's Chart, lab work & pertinent test results, reviewed documented beta blocker date and time   Airway Mallampati: II  TM Distance: >3 FB Neck ROM: Full    Dental no notable dental hx. (+) Teeth Intact, Dental Advisory Given   Pulmonary neg pulmonary ROS, former smoker,    Pulmonary exam normal breath sounds clear to auscultation       Cardiovascular hypertension, Pt. on medications and Pt. on home beta blockers + CAD, + Past MI and +CHF  + Cardiac Defibrillator  Rhythm:Regular Rate:Normal     Neuro/Psych negative neurological ROS  negative psych ROS   GI/Hepatic negative GI ROS, Neg liver ROS,   Endo/Other  diabetes, Insulin DependentHypothyroidism   Renal/GU negative Renal ROS  negative genitourinary   Musculoskeletal   Abdominal   Peds  Hematology negative hematology ROS (+)   Anesthesia Other Findings   Reproductive/Obstetrics negative OB ROS                            Anesthesia Physical Anesthesia Plan  ASA: III  Anesthesia Plan: General   Post-op Pain Management:    Induction: Intravenous  PONV Risk Score and Plan: 4 or greater and Ondansetron, Dexamethasone and Treatment may vary due to age or medical condition  Airway Management Planned: Oral ETT  Additional Equipment:   Intra-op Plan:   Post-operative Plan: Extubation in OR  Informed Consent: I have reviewed the patients History and Physical, chart, labs and discussed the procedure including the risks, benefits and alternatives for the proposed anesthesia with the patient or authorized representative who has indicated his/her understanding and acceptance.     Dental advisory given  Plan Discussed with: CRNA  Anesthesia Plan Comments:         Anesthesia Quick  Evaluation

## 2019-01-20 NOTE — Progress Notes (Signed)
Patient continues to have significant coughing spells; states she still "groggy from procedure" and feels she needs to stay overnight.  Dr. Florene Glen notified, will not discharge tonight due to uncontrolled coughing at this time.  Plan to DC home in AM.

## 2019-01-20 NOTE — Progress Notes (Signed)
Report received from [place], [place].

## 2019-01-20 NOTE — Transfer of Care (Signed)
Immediate Anesthesia Transfer of Care Note  Patient: Desiree Harrison  Procedure(s) Performed: VIDEO BRONCHOSCOPY WITH BIOPSY (N/A )  Patient Location: PACU  Anesthesia Type:General  Level of Consciousness: drowsy  Airway & Oxygen Therapy: Patient Spontanous Breathing and Patient connected to face mask oxygen  Post-op Assessment: Report given to RN and Post -op Vital signs reviewed and stable  Post vital signs: Reviewed and stable  Last Vitals:  Vitals Value Taken Time  BP    Temp    Pulse 80 01/20/2019  2:04 PM  Resp 13 01/20/2019  2:04 PM  SpO2 98 % 01/20/2019  2:04 PM  Vitals shown include unvalidated device data.  Last Pain:  Vitals:   01/20/19 0805  TempSrc: Oral  PainSc:       Patients Stated Pain Goal: 0 (59/16/38 4665)  Complications: No apparent anesthesia complications

## 2019-01-20 NOTE — Progress Notes (Signed)
PROGRESS NOTE    Desiree Harrison  GGY:[id number] DOB: 03-23-1946 DOA: 01/17/2019 PCP: Scot Jun, FNP  Brief Narrative:  Desiree Harrison is a 73 y.o. 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  She has been coughing with fever.  She has been dealing with PNA off and on for 4 months.  She had abx in November and other times including recently.  She has taken Amoxicillin, Levaquin, and Prednisone.  Her last course of Levaquin was 1-2 weeks ago.  She has never felt bad but has had the same aggravating cough.  No SOB.  No sick contacts.  Now s/p bronchoscopy with pulm.  Plan for d/c 5/13 AM if pt stable.  Assessment & Plan:   Principal Problem:   Sepsis due to pneumonia [facility]) Active Problems:   Chronic combined systolic and diastolic heart failure (HCC)   Diabetes mellitus ([place])   Essential hypertension   Hypothyroidism   Hyperlipidemia   ICD (implantable cardioverter-defibrillator) in place   Long term (current) use of anticoagulants   Pneumonia  Sepsis due to PNA - Recurrent fever last night - CT scan with consolidation of RUL.  F/u recommended at 6-8 weeks to ensure resolution.  - Recurrent PNA, 5 rounds of abx since 11/19 - Appreciate pulmonology recommendations - planning for bronchoscopy -> no endobronchial abnormalities, purulent secretions from the posterior segment of RUL.  Follow biopsy, cytology, and cultures.  AFB smear and cx pending as well as fungal cultures. 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  Consider PET-CT or biopsy.  DM -A1c was 9.0 on 3/16, indicating suboptimal glycemic control -She is on 50/50 insulin at home with 50 units BID -Will change to NPH 12 units BID.  D/c mealtime with hypoglycemia. -Cover with moderate-scale SSI  HTN -Continue Coreg -Hold other medications due to borderline hypotension on presentation, including Norvasc, Cozaar (not taking)  HLD -Continue Lipitor  Hypothyroidism -Normal thyroid studies in 12/19 -Continue Synthroid at current dose for now  DVT prophylaxis: warfarin Code Status: full  Family Communication: none at bedside - daughter on phone Disposition Plan: hopefully d/c tomorrow 5/13 AM  Consultants:   pulmonology  Procedures:   none  Antimicrobials: Anti-infectives (From admission, onward)   Start     Dose/Rate Route Frequency Ordered Stop   01/17/19 0200  cefTRIAXone (ROCEPHIN) 2 g in sodium chloride 0.9 % 100 mL IVPB     2 g 200 mL/hr over 30 Minutes Intravenous Every 24 hours 01/17/19 0155     01/17/19 0200  azithromycin (ZITHROMAX) 500 mg in sodium chloride 0.9 % 250 mL IVPB     500 mg 250 mL/hr over 60 Minutes Intravenous Every 24 hours 01/17/19 0155        Subjective: Coughing  spells continue Feels groggy and tired after procedure, doesn't feel ready for d/c  Objective: Vitals:   01/20/19 1420 01/20/19 1435 01/20/19 1500 01/20/19 1612  BP: 100/61 102/71  (!) 105/59  Pulse: 80 77  75  Resp: 20 14  (!) 30  Temp:  97.6 F (36.4 C)  98.8 F (37.1 C)  TempSrc:    Oral  SpO2: 99% 95% 98% 93%  Weight:      Height:        Intake/Output Summary (Last 24 hours) at 01/20/2019  1938 Last data filed at 01/20/2019 1359 Gross per 24 hour  Intake 603 ml  Output 2 ml  Net 601 ml   Filed Weights   01/19/19 2347  Weight: 97.6 kg    Examination:  General: No acute distress. Cardiovascular: Heart sounds show a regular rate, and rhythm.  Lungs: Clear to auscultation bilaterally Abdomen: Soft, nontender, nondistended  Neurological: Alert and oriented 3. Moves all extremities 4 . Cranial nerves II through XII grossly intact. Skin: Warm and dry. No rashes or lesions. Extremities: No clubbing or cyanosis. No edema.  Psychiatric: Mood and affect are normal. Insight and judgment are appropriate.    Data Reviewed: I have personally reviewed following labs and imaging studies  CBC: Recent Labs  Lab 01/17/19 0230 01/18/19 0544 01/19/19 0603 01/20/19 0529  WBC 11.6* 11.1* 11.4* 9.9  NEUTROABS 8.3*  --   --   --   HGB 12.5 10.7* 10.6* 10.3*  HCT 38.9 34.1* 33.9* 32.8*  MCV 81.9 82.4 82.5 81.6  PLT 205 219 220 387   Basic Metabolic Panel: Recent Labs  Lab 01/17/19 0230 01/18/19 0544 01/19/19 0603 01/20/19 0529  NA 141 140 143 141  K 3.8 3.5 3.5 3.2*  CL 104 104 106 105  CO2 24 25 25 24   GLUCOSE 179* 137* 104* 119*  BUN 16 15 14 11   CREATININE 1.17* 1.19* 1.15* 0.95  CALCIUM 8.7* 8.0* 8.1* 8.0*  MG  --   --  1.8  --    GFR: Estimated Creatinine Clearance: 65.8 mL/min (by C-G formula based on SCr of 0.95 mg/dL). Liver Function Tests: Recent Labs  Lab 01/17/19 0230 01/19/19 0603  AST 28 22  ALT 18 14  ALKPHOS 70 53  BILITOT 0.9 0.4  PROT 7.8 6.3*  ALBUMIN 2.7* 2.0*   No results for input(s): LIPASE, AMYLASE in the last 168 hours. No results for input(s): AMMONIA in the last 168 hours. Coagulation Profile: Recent Labs  Lab 01/17/19 0230 01/18/19 0722 01/19/19 0603 01/20/19 0529  INR 2.0* 1.9* 1.7* 1.4*   Cardiac Enzymes: No results for input(s): CKTOTAL, CKMB, CKMBINDEX, TROPONINI in the last 168 hours. BNP (last 3  results) Recent Labs    01/12/19 0921  PROBNP 57.0   HbA1C: No results for input(s): HGBA1C in the last 72 hours. CBG: Recent Labs  Lab 01/19/19 2152 01/20/19 0804 01/20/19 1130 01/20/19 1442 01/20/19 1613  GLUCAP 163* 110* 102* 87 103*   Lipid Profile: No results for input(s): CHOL, HDL, LDLCALC, TRIG, CHOLHDL, LDLDIRECT in the last 72 hours. Thyroid Function Tests: No results for input(s): TSH, T4TOTAL, FREET4, T3FREE, THYROIDAB in the last 72 hours. Anemia Panel: No results for input(s): VITAMINB12, FOLATE, FERRITIN, TIBC, IRON, RETICCTPCT in the last 72 hours. Sepsis Labs: Recent Labs  Lab 01/17/19 0225 01/17/19 0928 01/17/19 1138  PROCALCITON  --   --  <0.10  LATICACIDVEN 1.0 1.1  --     Recent Results (from the past 240  hour(s))  Blood Culture (routine x 2)     Status: None (Preliminary result)   Collection Time: 01/17/19  2:30 AM  Result Value Ref Range Status   Specimen Description BLOOD LEFT ARM  Final   Special Requests   Final    BOTTLES DRAWN AEROBIC AND ANAEROBIC Blood Culture adequate volume   Culture   Final    NO GROWTH 3 DAYS Performed at [facility], [address] [place], [place] [address]    Report Status PENDING  Incomplete  Blood Culture (routine x 2)     Status: None (Preliminary result)   Collection Time: 01/17/19  2:35 AM  Result Value Ref Range Status   Specimen Description BLOOD RIGHT HAND  Final   Special Requests   Final    BOTTLES DRAWN AEROBIC AND ANAEROBIC Blood Culture adequate volume   Culture   Final    NO GROWTH 3 DAYS Performed at [facility], [place] [address], [place], [place] [address]    Report Status PENDING  Incomplete  SARS Coronavirus 2 [facility] order, Performed in [place] hospital lab)     Status: None   Collection Time: 01/17/19  2:39 AM  Result Value Ref Range Status   SARS Coronavirus 2 NEGATIVE NEGATIVE Final    Comment: (NOTE) If result is NEGATIVE SARS-CoV-2 target nucleic acids are  NOT DETECTED. The SARS-CoV-2 RNA is generally detectable in upper and lower  respiratory specimens during the acute phase of infection. The lowest  concentration of SARS-CoV-2 viral copies this assay can detect is 250  copies / mL. A negative result does not preclude SARS-CoV-2 infection  and should not be used as the sole basis for treatment or other  patient management decisions.  A negative result may occur with  improper specimen collection / handling, submission of specimen other  than nasopharyngeal swab, presence of viral mutation(s) within the  areas targeted by this assay, and inadequate number of viral copies  (<250 copies / mL). A negative result must be combined with clinical  observations, patient history, and epidemiological information. If result is POSITIVE SARS-CoV-2 target nucleic acids are DETECTED. The SARS-CoV-2 RNA is generally detectable in upper and lower  respiratory specimens dur ing the acute phase of infection.  Positive  results are indicative of active infection with SARS-CoV-2.  Clinical  correlation with patient history and other diagnostic information is  necessary to determine patient infection status.  Positive results do  not rule out bacterial infection or co-infection with other viruses. If result is PRESUMPTIVE POSTIVE SARS-CoV-2 nucleic acids MAY BE PRESENT.   A presumptive positive result was obtained on the submitted specimen  and confirmed on repeat testing.  While 2019 novel coronavirus  (SARS-CoV-2) nucleic acids may be present in the submitted sample  additional confirmatory testing may be necessary for epidemiological  and / or clinical management purposes  to differentiate between  SARS-CoV-2 and other Sarbecovirus currently known to infect humans.  If clinically indicated additional testing with an alternate test  methodology [phone]) is advised. The SARS-CoV-2 RNA is generally  detectable in upper and lower respiratory sp ecimens  during the acute  phase of infection. The expected result is Negative. Fact Sheet for Patients:  StrictlyIdeas.no Fact Sheet for Healthcare Providers: BankingDealers.co.za This test is not yet approved or cleared by the [place] FDA and has been authorized for detection and/or diagnosis of SARS-CoV-2 by FDA under an Emergency Use Authorization (EUA).  This EUA will remain  in effect (meaning this test can be used) for the duration of the COVID-19 declaration under Section 564(b)(1) of the Act, 21 U.S.C. section 360bbb-3(b)(1), unless the authorization is terminated or revoked sooner. Performed at [facility], [place] [address] [place], [place] [address]   Urine culture     Status: Abnormal   Collection Time: 01/17/19  3:59 AM  Result Value Ref Range Status   Specimen Description URINE, CLEAN CATCH  Final   Special Requests   Final    NONE Performed at [facility], [place] [address], [place], [place] [address]    Culture MULTIPLE SPECIES PRESENT, SUGGEST RECOLLECTION (A)  Final   Report Status 01/18/2019 FINAL  Final  Culture, respiratory     Status: None (Preliminary result)   Collection Time: 01/20/19  1:34 PM  Result Value Ref Range Status   Specimen Description BRONCHIAL ALVEOLAR LAVAGE  Final   Special Requests NONE  Final   Gram Stain   Final    MODERATE WBC PRESENT,BOTH PMN AND MONONUCLEAR RARE GRAM POSITIVE COCCI Performed at [facility], [place] [address], [place], [place] [address]    Culture PENDING  Incomplete   Report Status PENDING  Incomplete         Radiology Studies: Dg Chest Port 1 View  Result Date: 01/20/2019 CLINICAL DATA:  Post bronchoscopy with biopsy. EXAM: PORTABLE CHEST 1 VIEW COMPARISON:  01/17/2019 FINDINGS: Persistent airspace disease in the periphery of the right upper lung has minimally changed. Negative for a pneumothorax. Stable appearance of the left cardiac ICD. Central  vascular structures are mildly enlarged. Trachea is midline. Heart size is stable. Haziness at the left lung base. IMPRESSION: 1. Negative for pneumothorax following bronchoscopy and biopsy. 2. Right lung airspace disease has minimally changed. 3. Enlargement of the central vascular structures suggesting vascular congestion. 4. New haziness at the left lung base is likely related to atelectasis. Electronically Signed   By: Markus Daft M.D.   On: 01/20/2019 14:47   Dg C-arm Bronchoscopy  Result Date: 01/20/2019 C-ARM BRONCHOSCOPY: Fluoroscopy was utilized by the requesting physician.  No radiographic interpretation.        Scheduled Meds: . aspirin EC  81 mg Oral Daily  . atorvastatin  80 mg Oral QPM  . carvedilol  25 mg Oral BID WC  . chlorpheniramine-HYDROcodone  5 mL Oral Q12H  . docusate sodium  100 mg Oral BID  . fluticasone  2 spray Each Nare Daily  . gabapentin  200 mg Oral BID  . insulin aspart  0-5 Units Subcutaneous QHS  . insulin aspart  0-9 Units Subcutaneous TID WC  . insulin NPH Human  12 Units Subcutaneous BID AC & HS  . levothyroxine  50 mcg Oral Daily  . loratadine  10 mg Oral Daily  . pantoprazole  40 mg Oral Daily  . sodium chloride flush  3 mL Intravenous Q12H  . Warfarin - Pharmacist Dosing Inpatient   Does not apply q1800   Continuous Infusions: . sodium chloride 10 mL/hr at 01/20/19 1215  . azithromycin 500 mg (01/20/19 0324)  . cefTRIAXone (ROCEPHIN)  IV 2 g (01/20/19 0142)     LOS: 2 days    Time spent: over 77 min    Fayrene Helper, MD [facility] Pager AMION  If 7PM-7AM, please contact night-coverage [email or web address] Password [facility] 01/20/2019, 7:38 PM

## 2019-01-20 NOTE — Op Note (Addendum)
PCCM Video Bronchoscopy Procedure Note  The patient was informed of the risks (including but not limited to bleeding, infection, respiratory failure, lung injury, tooth/oral injury) and benefits of the procedure and gave consent, see chart.  Indication: Persistent right upper lobe opacity  Post Procedure Diagnosis: Right upper lobe pneumonia  Location: Right upper lobe posterior segment  Condition pre procedure: Stable  Medications for procedure: GA  Procedure description: The bronchoscope was introduced through the endotracheal tube and passed to the bilateral lungs to the level of the subsegmental bronchi throughout the tracheobronchial tree.    Airway exam revealed no endobronchial abnormalities were noted.  Purulent secretions noted from posterior segment of right upper lobe   Procedures performed:  BAL performed in the posterior segment of right upper lobe.  60 cc aliquots x3 instilled and reported 100 cc of mucopurulent fluid obtained on suction.    Brushings x 5 were performed posterior segment of right upper lobe under fluoroscopic guidance.   Transbronchial forceps biopsy x 1 obtained from posterior segment of right upper lobe.  There was minimal bleeding which stabilized after instillation of ice cold saline.  The area was observed for 5 minutes with no evidence of further bleeding.  Specimens sent: BAL, brushing and transbronchial biopsy from posterior segment of right upper lobe  Condition post procedure: Stable  EBL: Minimal  Complications: None  Right upper lobe    Right upper lobe posterior segment      Marshell Garfinkel MD [facility] 01/20/2019, 2:03 PM

## 2019-01-20 NOTE — Progress Notes (Signed)
NAME:  Desiree Harrison, MRN:  [id number], DOB:  December 27, 1945, LOS: 2 ADMISSION DATE:  01/17/2019, CONSULTATION DATE:  01/17/2019 REFERRING MD:  Thomasenia Bottoms MD, CHIEF COMPLAINT: Abnormal CT scan  Brief History   73 year old F admitted 5/9 with chronic cough, fever.  She had been treated intermittently for pneumonia for the past several months.  Initially diagnosed in November 2019 and treated with multiple rounds of antibiotic.  Noted to have right upper lobe pneumonia on chest x-ray on 11/12/2018.  Seen in pulmonary clinic recently but was unable to get a CT due to COVID restrictions. Admitted to the hospital on 5/9 with CT showing right upper lobe consolidation. She has minimal smoking history in college.  Quit about 50 years ago.  No exposure history.  Past Medical History  Ventricular aneurysm on Coumadin, diabetes, coronary artery disease status post AICD, hypothyroidism  Significant Hospital Events   5/09 Admit  Consults:  PCCM  Procedures:  CT chest 01/17/2019 >> large consolidation in the right upper lobe, right middle lobe nodule measuring 18 mm.  No evidence of mediastinal or hilar lymphadenopathy.    Significant Diagnostic Tests:    Micro Data:  SARS COV2 5/9 >> negative  Antimicrobials:  Azithromycin 5/9 >> Ceftriaxone 5/9 >>  Interim history/subjective:  No acute events.  Continues to have dry nonproductive cough.  Objective   Blood pressure 134/75, pulse 82, temperature 99 F (37.2 C), temperature source Oral, resp. rate (!) 24, height 5' 7.52" (1.715 m), weight 97.6 kg, SpO2 (!) 89 %.        Intake/Output Summary (Last 24 hours) at 01/20/2019 1124 Last data filed at 01/19/2019 2243 Gross per 24 hour  Intake 3 ml  Output -  Net 3 ml   Filed Weights   01/19/19 2347  Weight: 97.6 kg    Examination: Gen:      No acute distress HEENT:  EOMI, sclera anicteric Neck:     No masses; no thyromegaly Lungs:    Clear to auscultation bilaterally; normal respiratory effort CV:          Regular rate and rhythm; no murmurs Abd:      + bowel sounds; soft, non-tender; no palpable masses, no distension Ext:    No edema; adequate peripheral perfusion Skin:      Warm and dry; no rash Neuro: alert and oriented x 3 Psych: normal mood and affect  Resolved Hospital Problem list     Assessment & Plan:  73 year old with recurrent pneumonias since November 2019, persistent right upper lobe opacity CT today reviewed with right upper lobe consolidation.  Differential diagnosis includes recurrent pneumonia, malignancy with postobstructive infection, noninfectious process such as eosinophilic pneumonia, COP  RUL Opacity  Cough P: Continue antibiotics Plan for bronchoscopy today Discussed with patient and family and they are agreeable to proceed Tussionex for cough   Labs   CBC: Recent Labs  Lab 01/17/19 0230 01/18/19 0544 01/19/19 0603 01/20/19 0529  WBC 11.6* 11.1* 11.4* 9.9  NEUTROABS 8.3*  --   --   --   HGB 12.5 10.7* 10.6* 10.3*  HCT 38.9 34.1* 33.9* 32.8*  MCV 81.9 82.4 82.5 81.6  PLT 205 219 220 144    Basic Metabolic Panel: Recent Labs  Lab 01/17/19 0230 01/18/19 0544 01/19/19 0603 01/20/19 0529  NA 141 140 143 141  K 3.8 3.5 3.5 3.2*  CL 104 104 106 105  CO2 24 25 25 24   GLUCOSE 179* 137* 104* 119*  BUN 16 15 14  11  CREATININE 1.17* 1.19* 1.15* 0.95  CALCIUM 8.7* 8.0* 8.1* 8.0*  MG  --   --  1.8  --    GFR: Estimated Creatinine Clearance: 65.8 mL/min (by C-G formula based on SCr of 0.95 mg/dL). Recent Labs  Lab 01/17/19 0225 01/17/19 0230 01/17/19 0928 01/17/19 1138 01/18/19 0544 01/19/19 0603 01/20/19 0529  PROCALCITON  --   --   --  <0.10  --   --   --   WBC  --  11.6*  --   --  11.1* 11.4* 9.9  LATICACIDVEN 1.0  --  1.1  --   --   --   --     Liver Function Tests: Recent Labs  Lab 01/17/19 0230 01/19/19 0603  AST 28 22  ALT 18 14  ALKPHOS 70 53  BILITOT 0.9 0.4  PROT 7.8 6.3*  ALBUMIN 2.7* 2.0*   No results for  input(s): LIPASE, AMYLASE in the last 168 hours. No results for input(s): AMMONIA in the last 168 hours.  ABG No results found for: PHART, PCO2ART, PO2ART, HCO3, TCO2, ACIDBASEDEF, O2SAT   Coagulation Profile: Recent Labs  Lab 01/17/19 0230 01/18/19 0722 01/19/19 0603 01/20/19 0529  INR 2.0* 1.9* 1.7* 1.4*    Cardiac Enzymes: No results for input(s): CKTOTAL, CKMB, CKMBINDEX, TROPONINI in the last 168 hours.  HbA1C: Hgb A1c MFr Bld  Date/Time Value Ref Range Status  11/24/2018 11:24 AM 9.0 (H) 4.8 - 5.6 % Final    Comment:             Prediabetes: 5.7 - 6.4          Diabetes: >6.4          Glycemic control for adults with diabetes: <7.0   08/19/2018 09:19 AM 13.4 (H) 4.8 - 5.6 % Final    Comment:             Prediabetes: 5.7 - 6.4          Diabetes: >6.4          Glycemic control for adults with diabetes: <7.0     CBG: Recent Labs  Lab 01/19/19 0848 01/19/19 1226 01/19/19 1640 01/19/19 2152 01/20/19 0804  GLUCAP 79 124* 154* 163* 110*   Safiatou Islam MD [facility] 01/20/2019, 11:25 AM

## 2019-01-20 NOTE — Anesthesia Procedure Notes (Addendum)
Procedure Name: Intubation Date/Time: 01/20/2019 1:14 PM Performed by: Imagene Riches, CRNA Pre-anesthesia Checklist: Patient identified, Emergency Drugs available, Suction available and Patient being monitored Patient Re-evaluated:Patient Re-evaluated prior to induction Oxygen Delivery Method: Circle System Utilized Preoxygenation: Pre-oxygenation with 100% oxygen Induction Type: IV induction Laryngoscope Size: Miller and 2 Grade View: Grade I Tube type: Oral Tube size: 8.5 mm Number of attempts: 1 Airway Equipment and Method: Stylet and Oral airway Placement Confirmation: ETT inserted through vocal cords under direct vision,  positive ETCO2 and breath sounds checked- equal and bilateral Secured at: 21 cm Tube secured with: Tape Dental Injury: Teeth and Oropharynx as per pre-operative assessment

## 2019-01-20 NOTE — Progress Notes (Addendum)
ANTICOAGULATION CONSULT NOTE - Initial Consult  Pharmacy Consult for Warfarin Indication: ventricular aneurysm  Allergies  Allergen Reactions  . Lisinopril Cough   Patient Measurements: Height: 5' 7.52" (171.5 cm) Weight: 215 lb 2 oz (97.6 kg) IBW/kg (Calculated) : 62.8 Height: 5' 7.5" Weight 96.9 kg  Vital Signs: Temp: 99 F (37.2 C) (05/12 0805) Temp Source: Oral (05/12 0805) BP: 134/75 (05/12 0805) Pulse Rate: 82 (05/12 0805)  Labs: Recent Labs    01/18/19 0544 01/18/19 0722 01/19/19 0603 01/20/19 0529  HGB 10.7*  --  10.6* 10.3*  HCT 34.1*  --  33.9* 32.8*  PLT 219  --  220 251  LABPROT  --  21.7* 19.7* 17.1*  INR  --  1.9* 1.7* 1.4*  CREATININE 1.19*  --  1.15* 0.95   Estimated Creatinine Clearance: 65.8 mL/min (by C-G formula based on SCr of 0.95 mg/dL).  Medical History: Past Medical History:  Diagnosis Date  . Acute systolic heart failure ([place])   . CAD in native artery   . Cardiomyopathy ([place])   . Long term (current) use of anticoagulants   . S/P internal cardiac defibrillator procedure    placed 10/28/15  . STEMI (ST elevation myocardial infarction) ([place])   . Type 2 diabetes mellitus, with long-term current use of insulin ([place])   . Ventricular aneurysm     Medications:  PTA warfarin 4.5 mg on Wed/Fri/Sun, 3mg  all other days of the week, LD 5/7 at 1800   Assessment: 72 y/oF admitted on 01/17/19 with fever, SOB, cough, failed outpatient treatment for PNA. CT chest with large consolidation within RUL compatible with pneumonia. 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 Pharmacy consulted for warfarin dosing while patient admitted. 5/9 INR = 2. Hgb, Pltc WNL. On ASA 81mg  daily as per PTA regimen. No bleeding issues reported. On broad spectrum antibiotics, which can increase INR.    Goal of Therapy:  INR 2-3 Monitor platelets by anticoagulation protocol: Yes   5/9: PCCM planned bronchoscopy on 5/11. Per Dr. Vaughan Browner, hold warfarin for  now. Bridged with lovenox 1mg /kg SQ q12h-last dose 5/10 am. No LMWH needed per Cards > Lovenox d/c  5/11: Bronchoscopy postponed to 5/12, continue to hold anti-coag till after procedure 5/12: INR 1.4, desired < 1.5 for Bronch   Plan:   Bronch completed, resume Warfarin, no Lovenox bridge necessary back to therapeutic INR  Warfarin 4mg  today at 1800  Continue Daily PT/INR  Monitor CBC and for s/sx of bleeding  Minda Ditto PharmD [phone] till 3:30p

## 2019-01-21 ENCOUNTER — Encounter (HOSPITAL_COMMUNITY): Payer: Self-pay | Admitting: Pulmonary Disease

## 2019-01-21 ENCOUNTER — Inpatient Hospital Stay: Admission: RE | Admit: 2019-01-21 | Payer: Medicare Other | Source: Ambulatory Visit

## 2019-01-21 ENCOUNTER — Telehealth: Payer: Self-pay | Admitting: Cardiovascular Disease

## 2019-01-21 ENCOUNTER — Other Ambulatory Visit: Payer: Medicare Other

## 2019-01-21 ENCOUNTER — Telehealth: Payer: Self-pay | Admitting: Pulmonary Disease

## 2019-01-21 LAB — COMPREHENSIVE METABOLIC PANEL
ALT: 17 U/L (ref 0–44)
AST: 23 U/L (ref 15–41)
Albumin: 1.9 g/dL — ABNORMAL LOW (ref 3.5–5.0)
Alkaline Phosphatase: 56 U/L (ref 38–126)
Anion gap: 9 (ref 5–15)
BUN: 17 mg/dL (ref 8–23)
CO2: 22 mmol/L (ref 22–32)
Calcium: 8.1 mg/dL — ABNORMAL LOW (ref 8.9–10.3)
Chloride: 108 mmol/L (ref 98–111)
Creatinine, Ser: 1.18 mg/dL — ABNORMAL HIGH (ref 0.44–1.00)
GFR calc Af Amer: 54 mL/min — ABNORMAL LOW (ref >60)
GFR calc non Af Amer: 46 mL/min — ABNORMAL LOW (ref >60)
Glucose, Bld: 266 mg/dL — ABNORMAL HIGH (ref 70–99)
Potassium: 3.8 mmol/L (ref 3.5–5.1)
Sodium: 139 mmol/L (ref 135–145)
Total Bilirubin: 0.3 mg/dL (ref 0.3–1.2)
Total Protein: 6.4 g/dL — ABNORMAL LOW (ref 6.5–8.1)

## 2019-01-21 LAB — BODY FLUID CELL COUNT WITH DIFFERENTIAL
Eos, Fluid: 0 %
Lymphs, Fluid: 6 %
Monocyte-Macrophage-Serous Fluid: 0 % — ABNORMAL LOW (ref 50–90)
Neutrophil Count, Fluid: 94 % — ABNORMAL HIGH (ref 0–25)
Total Nucleated Cell Count, Fluid: 813 cu mm (ref 0–1000)

## 2019-01-21 LAB — CBC
HCT: 33.9 % — ABNORMAL LOW (ref 36.0–46.0)
Hemoglobin: 10.6 g/dL — ABNORMAL LOW (ref 12.0–15.0)
MCH: 25.6 pg — ABNORMAL LOW (ref 26.0–34.0)
MCHC: 31.3 g/dL (ref 30.0–36.0)
MCV: 81.9 fL (ref 80.0–100.0)
Platelets: 255 10*3/uL (ref 150–400)
RBC: 4.14 MIL/uL (ref 3.87–5.11)
RDW: 15.6 % — ABNORMAL HIGH (ref 11.5–15.5)
WBC: 11.7 10*3/uL — ABNORMAL HIGH (ref 4.0–10.5)
nRBC: 0 % (ref 0.0–0.2)

## 2019-01-21 LAB — PROTIME-INR
INR: 1.5 — ABNORMAL HIGH (ref 0.8–1.2)
Prothrombin Time: 17.6 seconds — ABNORMAL HIGH (ref 11.4–15.2)

## 2019-01-21 LAB — MAGNESIUM: Magnesium: 1.8 mg/dL (ref 1.7–2.4)

## 2019-01-21 LAB — ACID FAST SMEAR (AFB, MYCOBACTERIA): Acid Fast Smear: NEGATIVE

## 2019-01-21 LAB — GLUCOSE, CAPILLARY
Glucose-Capillary: 163 mg/dL — ABNORMAL HIGH (ref 70–99)
Glucose-Capillary: 233 mg/dL — ABNORMAL HIGH (ref 70–99)

## 2019-01-21 MED ORDER — CEFDINIR 300 MG PO CAPS: 300.0000 mg | ORAL_CAPSULE | Freq: Two times a day (BID) | ORAL | 0 refills | Status: AC

## 2019-01-21 MED ORDER — INSULIN LISPRO PROT & LISPRO (50-50 MIX) 100 UNIT/ML ~~LOC~~ SUSP: 30.0000 [IU] | Freq: Two times a day (BID) | SUBCUTANEOUS | 3 refills | Status: DC

## 2019-01-21 MED ORDER — HYDROCOD POLST-CPM POLST ER 10-8 MG/5ML PO SUER: 5.0000 mL | Freq: Two times a day (BID) | ORAL | 0 refills | Status: DC

## 2019-01-21 MED ORDER — BENZONATATE 200 MG PO CAPS: 200.0000 mg | ORAL_CAPSULE | Freq: Two times a day (BID) | ORAL | 0 refills | Status: AC | PRN

## 2019-01-21 MED FILL — BENZONATATE 200 MG CAPS: 200 | 5 days supply | Qty: 20 | Fill #0

## 2019-01-21 MED FILL — CEFDINIR 300 MG CAPSULE: 300 | 2 days supply | Qty: 4 | Fill #0

## 2019-01-21 NOTE — Progress Notes (Signed)
ANTICOAGULATION CONSULT NOTE - Initial Consult  Pharmacy Consult for Warfarin Indication: ventricular aneurysm  Allergies  Allergen Reactions  . Lisinopril Cough   Patient Measurements: Height: 5' 7.52" (171.5 cm) Weight: 215 lb 2 oz (97.6 kg) IBW/kg (Calculated) : 62.8 Height: 5' 7.5" Weight 96.9 kg  Vital Signs: Temp: 98 F (36.7 C) (05/13 0741) Temp Source: Oral (05/13 0741) BP: 103/56 (05/13 0741) Pulse Rate: 67 (05/13 0741)  Labs: Recent Labs    01/19/19 0603 01/20/19 0529 01/21/19 0417  HGB 10.6* 10.3* 10.6*  HCT 33.9* 32.8* 33.9*  PLT 220 251 255  LABPROT 19.7* 17.1* 17.6*  INR 1.7* 1.4* 1.5*  CREATININE 1.15* 0.95 1.18*   Estimated Creatinine Clearance: 52.9 mL/min (A) (by C-G formula based on SCr of 1.18 mg/dL (H)).  Medical History: Past Medical History:  Diagnosis Date  . Acute systolic heart failure ([place])   . CAD in native artery   . Cardiomyopathy ([place])   . Long term (current) use of anticoagulants   . S/P internal cardiac defibrillator procedure    placed 10/28/15  . STEMI (ST elevation myocardial infarction) ([place])   . Type 2 diabetes mellitus, with long-term current use of insulin ([place])   . Ventricular aneurysm     Medications:  PTA warfarin 4.5 mg on Wed/Fri/Sun, 3mg  all other days of the week, LD 5/7 at 1800   Assessment: 72 y/oF admitted on 01/17/19 with fever, SOB, cough, failed outpatient treatment for PNA. CT chest with large consolidation within RUL compatible with pneumonia. 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 Pharmacy consulted for warfarin dosing while patient admitted. 5/9 INR = 2. Hgb, Pltc WNL. On ASA 81mg  daily as per PTA regimen. No bleeding issues reported. On broad spectrum antibiotics, which can increase INR.    Goal of Therapy:  INR 2-3 Monitor platelets by anticoagulation protocol: Yes   5/9: PCCM planned bronchoscopy on 5/11. Per Dr. Vaughan Browner, hold warfarin for now. Bridged with lovenox 1mg /kg SQ  q12h-last dose 5/10 am. No LMWH needed per Cards > Lovenox d/c  5/11: Bronchoscopy postponed to 5/12, continue to hold anti-coag till after procedure 5/12: INR 1.4, desired < 1.5 for Bronch 5/13: INR 1.5 after 4mg  Warfarin   Plan:   Bronch completed 5/12, resumed Warfarin, no Lovenox bridge necessary back to therapeutic INR  Discharge today, and will plan on patient resuming Warfarin at home today 5/13  Minda Ditto PharmD [phone] till 3:30p

## 2019-01-21 NOTE — Telephone Encounter (Signed)
New Message:    A person from the hospital call, she was on the floor that pt was on. She said pt need a 1 week [facility] appointment. I made that appointment. Please follow up to see if pt needs a TOC call, she did not state this.

## 2019-01-21 NOTE — Telephone Encounter (Signed)
Will forward to [facility] not sure pt needs to see cardiology was in hospital for pneumonia./cy

## 2019-01-21 NOTE — Telephone Encounter (Signed)
Desiree Garfinkel, MD  P [facility] Triage Pool; Donita Brooks, NP        Patient was seen in hospital. She will likely get discharged today  Please make follow up in clinic in 2 weeks with me or APPs. Thanks    Pt is a current pt of [facility]. Pt last seen by [facility] 01/12/2019. Pt has a current follow up appt scheduled with [facility] 02/09/2019 at 8:45.  Have notified Dr. Vaughan Browner in the staff message that he sent to [place] in regards to this. Nothing further needed.

## 2019-01-21 NOTE — Telephone Encounter (Signed)
I do agree that patient was not in the hospital for cardiac issue. She is a patient of Dr. Sallyanne Kuster, it looks like it is more important for her to have a coumadin followup which is already setup for this Friday. I don't think cardiology followup is urgent, this can be done in 3-4 weeks with Dr. Sallyanne Kuster or me. It is probably more important for her to followup with PCP at this point

## 2019-01-21 NOTE — Discharge Summary (Addendum)
. Physician Discharge Summary  Desiree Harrison PIR:[id number] DOB: April 06, 1946 DOA: 01/17/2019  PCP: Scot Jun, FNP  Admit date: 01/17/2019 Discharge date: 01/21/2019  Admitted From: Home Disposition:  Home  Recommendations for Outpatient Follow-up:  1. Follow up with PCP in 1-2 weeks 2. Please obtain BMP/CBC in one week  Discharge Condition: Stable  CODE STATUS: FULL   Brief/Interim Summary: Desiree Harrison a 73 y.o.femalewith medical history significant ofventricular aneurysmon Coumadin; DM; CAD; AICD; and chronic AC presenting with cough.She has been coughing with fever. She has been dealing with PNA off and on for 4 months. She had abx in November and other times including recently. She has taken Amoxicillin, Levaquin, and Prednisone. Her last course of Levaquin was 1-2 weeks ago. She has never felt bad but has had the same aggravating cough. No SOB. No sick contacts.  1. Sepsis due to PNA     - CT scan with consolidation of RUL.  F/u recommended at 6-8 weeks to ensure resolution.      - Recurrent PNA, 5 rounds of abx since 11/19     - S/p bronchoscopy with pulm -> no endobronchial abnormalities, purulent secretions from the posterior segment of RUL.  Follow biopsy, cytology, and cultures.  AFB smear and cx pending as well as fungal cultures.     - Resume warfarin     - urine strep, sputum cx pending     - Negative CT maxillofacial      - She is COVD negative     - Continue cefdinir for 4 more doses; follow up with PCP in 1 week.  2. 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 Pulmonary Nodule: requires follow up imaging.  Consider PET-CT or biopsy.  4. DM     - A1c was 9.0 on 3/16, indicating suboptimal glycemic control     - She is on 50/50 insulin at home with 50 units BID     - NPH 12 units BID.  D/c mealtime with hypoglycemia.     - Cover with moderate-scale SSI     - sugars are all over the place; will send home on half her regular long acting insulin (50/50 insulin -> now 30 units BID); have instructed her to keep a diabetic diary over next week; and follow up with PCP  5. HTN     - Continue Coreg     - Hold other medications due to borderline hypotension on presentation, including Norvasc, Cozaar (not taking)     - continue only Coreg at discharge until follow up with PCP  6. HLD     - Continue Lipitor  7. Hypothyroidism     - Normal thyroid studies in 12/19     - Continue Synthroid at current dose for now   BAL washing positive for MRSA noted 5/14.  Family notified. New Rx for Doxy 180m BID x 10 sent to [facility] on [address] per family request.   Discharge Diagnoses:  Principal Problem:   Sepsis due to pneumonia ([facility] Active Problems:   Chronic combined systolic and diastolic heart failure ([facility]   Diabetes mellitus ([facility]   Essential hypertension   Hypothyroidism   Hyperlipidemia   ICD (implantable cardioverter-defibrillator) in place   Long term (current) use of anticoagulants   Pneumonia    Discharge Instructions   Allergies as of 01/21/2019      Reactions   Lisinopril Cough      Medication List    STOP taking these medications   amLODipine 5 MG tablet Commonly known as:  NORVASC   atorvastatin 80 MG tablet Commonly known as:  LIPITOR   losartan 100 MG tablet Commonly known as:  COZAAR     TAKE these medications   albuterol 108 (90 Base) MCG/ACT inhaler Commonly known as:  VENTOLIN  HFA Inhale 1-2 puffs into the lungs every 6 (six) hours as needed for wheezing or shortness of breath.   aspirin EC 81 MG tablet Take 81 mg by mouth daily.   benzonatate 200 MG capsule Commonly known as:  TESSALON Take 1 capsule (200 mg total) by mouth 2 (two) times daily as needed for up to 5 days for cough.   carvedilol 25 MG tablet Commonly known as:  COREG Take 25 mg by mouth 2 (two) times daily with a meal.   cefdinir 300 MG capsule Commonly known as:  OMNICEF Take 1 capsule (300 mg total) by mouth 2 (two) times daily for 2 days.   cetirizine 10 MG tablet Commonly known as:  ZYRTEC Take 10 mg by mouth at bedtime.   chlorpheniramine-HYDROcodone 10-8 MG/5ML Suer Commonly known as:  TUSSIONEX Take 5 mLs by mouth 2 (two) times daily for 7 days.   gabapentin 100 MG capsule Commonly known as:  NEURONTIN TAKE 2 CAPSULES BY MOUTH 2 TIMES DAILY What changed:  See the new instructions.   insulin lispro 100 UNIT/ML injection Commonly known as:  HUMALOG Inject 0.1 mLs (10 Units total) into the skin 3 (three) times daily before meals.   insulin lispro protamine-lispro (50-50) 100 UNIT/ML Susp injection Commonly known as:  HUMALOG 50/50 MIX Inject 0.6 mLs (60 Units total) into the skin 2 (two) times daily before a meal. What changed:  how much to take   Insulin Pen Needle 30G X 8 MM Misc Commonly known as:  NOVOFINE Inject 10 each into the skin as needed.   levothyroxine 50 MCG tablet Commonly known as:  SYNTHROID Take 1 tablet (50 mcg total) by mouth daily.   omeprazole 20 MG capsule Commonly known as:  PRILOSEC Take 30- 60 min before your first and last meals of the day   warfarin 3 MG tablet Commonly known as:  COUMADIN Take as directed. If you are unsure how to take this medication, talk to your nurse or doctor. Original instructions:  TAKE 1 TABLET BY MOUTH EVERY DAY AT 6 PM What changed:    how much to take  how to take this  when to take this  additional  instructions       Allergies  Allergen Reactions  . Lisinopril Cough    Consultations:  Pulmonology   Procedures/Studies: Dg Chest 2 View  Result Date: 01/12/2019 CLINICAL DATA:  Dyspnea on exertion. EXAM: CHEST - 2 VIEW COMPARISON:  Radiographs of November 24, 2018. FINDINGS: The heart size and  mediastinal contours are within normal limits. Stable position of single lead left-sided pacemaker. No pneumothorax or pleural effusion is noted. Left lung is clear. Stable ill-defined right upper lobe opacity is noted; given the lack of change, it is unlikely that this represents pneumonia. The visualized skeletal structures are unremarkable. IMPRESSION: Stable ill-defined right upper lobe opacity is noted. Given the lack of change since prior exam, it is unlikely this represents pneumonia. CT scan of the chest is recommend to rule out other possible pathology, such as neoplasm. These results will be called to the ordering clinician or representative by the Radiologist Assistant, and communication documented in the PACS or zVision Dashboard. Electronically Signed   By: Marijo Conception M.D.   On: 01/12/2019 09:29   Ct Chest Wo Contrast  Result Date: 01/17/2019 CLINICAL DATA:  73 y/o F; 3 months of productive cough. History of pneumonia. EXAM: CT CHEST WITHOUT CONTRAST TECHNIQUE: Multidetector CT imaging of the chest was performed following the standard protocol without IV contrast. COMPARISON:  01/17/2019 chest radiograph FINDINGS: Cardiovascular: No significant vascular findings. Normal heart size. No pericardial effusion. Mild aortic and moderate coronary artery calcific atherosclerosis. Single lead AICD noted. Mediastinum/Nodes: No enlarged mediastinal or axillary lymph nodes. Thyroid gland, trachea, and esophagus demonstrate no significant findings. Lungs/Pleura: Dense consolidation within the right upper lobe with surrounding ground-glass opacity along the major and minor fissures. Discrete nodule in  the right middle lobe measuring 11 x 18 mm (series 4, image 25). Trace right pleural effusion. No pneumothorax. Upper Abdomen: Cholecystectomy. Moderate hiatal hernia. Musculoskeletal: No fracture is seen. IMPRESSION: 1. Large consolidation within the right upper lobe compatible with pneumonia. No necrosis. 2. Discrete nodule in the right middle lobe measuring up to 11 x 18 mm may be infectious/inflammatory. Follow-up CT chest after resolution of pneumonia is recommended to assess persistence. 3. Trace right pleural effusion. 4. Mild aortic and moderate coronary artery calcific atherosclerosis. 5. Moderate hiatal hernia. Electronically Signed   By: Kristine Garbe M.D.   On: 01/17/2019 04:46   Ct Chest High Resolution  Result Date: 01/17/2019 CLINICAL DATA:  Persistent cough EXAM: CT CHEST WITHOUT CONTRAST TECHNIQUE: Multidetector CT imaging of the chest was performed following the standard protocol without intravenous contrast. High resolution imaging of the lungs, as well as inspiratory and expiratory imaging, was performed. COMPARISON:  CT chest, 01/17/2019 FINDINGS: Cardiovascular: Scattered coronary artery calcifications. Normal heart size. No pericardial effusion. Mediastinum/Nodes: Enlarged right hilar lymph nodes with prominent although not pathologically enlarged mediastinal lymph nodes. Thyroid gland, trachea, and esophagus demonstrate no significant findings. Lungs/Pleura: No significant change in a dense, masslike consolidation of the right upper lobe. Redemonstrated discrete pulmonary nodule in the adjacent right middle lobe measuring 1.8 cm (series 8, image 62). Small right pleural effusion. No significant evidence of interstitial lung disease or air trapping. Upper Abdomen: No acute abnormality. Musculoskeletal: No chest wall mass or suspicious bone lesions identified. IMPRESSION: 1. Unchanged CT examination, in comparison to same day prior. No significant change in a dense, masslike  consolidation of the right upper lobe, most consistent with lobar pneumonia, although underlying mass is not strictly excluded. Recommend follow-up at 6-8 weeks to ensure complete resolution. 2. Redemonstrated discrete pulmonary nodule in the adjacent right middle lobe measuring 1.8 cm (series 8, image 62). Given size, this will require follow-up to establish stability, which can be performed in conjunction with above. PET-CT or biopsy may be considered given overall concern for malignancy. 3.  Small right pleural effusion. 4. No significant  evidence of interstitial lung disease or air trapping on this tailored ILD protocol examination. Electronically Signed   By: Eddie Candle M.D.   On: 01/17/2019 13:37   Dg Chest Port 1 View  Result Date: 01/20/2019 CLINICAL DATA:  Post bronchoscopy with biopsy. EXAM: PORTABLE CHEST 1 VIEW COMPARISON:  01/17/2019 FINDINGS: Persistent airspace disease in the periphery of the right upper lung has minimally changed. Negative for a pneumothorax. Stable appearance of the left cardiac ICD. Central vascular structures are mildly enlarged. Trachea is midline. Heart size is stable. Haziness at the left lung base. IMPRESSION: 1. Negative for pneumothorax following bronchoscopy and biopsy. 2. Right lung airspace disease has minimally changed. 3. Enlargement of the central vascular structures suggesting vascular congestion. 4. New haziness at the left lung base is likely related to atelectasis. Electronically Signed   By: Markus Daft M.D.   On: 01/20/2019 14:47   Dg Chest Portable 1 View  Result Date: 01/17/2019 CLINICAL DATA:  73 year old female with shortness of breath and cough. EXAM: PORTABLE CHEST 1 VIEW COMPARISON:  Chest radiograph dated 01/12/2019 FINDINGS: There is a patchy area of airspace opacity in the right upper lobe which is new since the prior radiograph of 01/12/2019 most consistent with pneumonia. Clinical correlation and follow-up to resolution recommended. There is  no pleural effusion or pneumothorax. Stable mild cardiomegaly. Left pectoral AICD device. No acute osseous pathology. IMPRESSION: Right upper lobe pneumonia.  Follow-up to resolution recommended. Electronically Signed   By: Anner Crete M.D.   On: 01/17/2019 02:32   Ct Maxillofacial Wo Contrast  Result Date: 01/17/2019 CLINICAL DATA:  Persistent cough and sinus problems. EXAM: CT MAXILLOFACIAL WITHOUT CONTRAST TECHNIQUE: Multidetector CT images of the paranasal sinuses were obtained using the standard protocol without intravenous contrast. COMPARISON:  None. FINDINGS: Paranasal sinuses: Frontal: Normally aerated. Patent frontal sinus drainage pathways. Ethmoid: Normally aerated. Maxillary: Normally aerated. Sphenoid: Trace mucosal thickening in the region of the sphenoid sinus ostia and sphenoethmoidal recesses, otherwise clear bilaterally. No fluid. Right ostiomeatal unit: Patent. Left ostiomeatal unit: Patent. Nasal passages: Patent. Asymmetric mild right inferior nasal turbinate hypertrophy. Leftward nasal septal deviation/mild spurring contacting the left inferior turbinate. Other: Clear mastoid air cells and tympanic cavities. Bilateral cataract extraction. Grossly unremarkable included portion of the brain. IMPRESSION: No significant paranasal sinus inflammatory disease. Electronically Signed   By: Logan Bores M.D.   On: 01/17/2019 11:39   Dg C-arm Bronchoscopy  Result Date: 01/20/2019 C-ARM BRONCHOSCOPY: Fluoroscopy was utilized by the requesting physician.  No radiographic interpretation.   Bronchoscopy (01/20/2019):  Post Procedure Diagnosis: Right upper lobe pneumonia Procedure description: The bronchoscope was introduced through the endotracheal tube and passed to the bilateral lungs to the level of the subsegmental bronchi throughout the tracheobronchial tree. Airway exam revealed no endobronchial abnormalities were noted.  Purulent secretions noted from posterior segment of right upper  lobe   Subjective: "I feel good. Can I go home?"  Discharge Exam: Vitals:   01/21/19 0515 01/21/19 0741  BP: (!) 104/57 (!) 103/56  Pulse: 75 67  Resp:  (!) 24  Temp: 98.1 F (36.7 C) 98 F (36.7 C)  SpO2: (!) 89% 91%   Vitals:   01/20/19 1612 01/20/19 2142 01/21/19 0515 01/21/19 0741  BP: (!) 105/59 105/63 (!) 104/57 (!) 103/56  Pulse: 75 75 75 67  Resp: (!) 30   (!) 24  Temp: 98.8 F (37.1 C) 97.8 F (36.6 C) 98.1 F (36.7 C) 98 F (36.7 C)  TempSrc: Oral Oral Oral  Oral  SpO2: 93% 92% (!) 89% 91%  Weight:      Height:        General: 73 y.o. female resting in chair in NAD Cardiovascular: RRR, +S1, S2, no m/g/r, equal pulses throughout Respiratory: decreased at bases but otherwise clear, normal WOB GI: BS+, NDNT, no masses noted, no organomegaly noted MSK: No e/c/c Skin: No rashes, bruises, ulcerations noted Neuro: A&O x 3, no focal deficits     The results of significant diagnostics from this hospitalization (including imaging, microbiology, ancillary and laboratory) are listed below for reference.     Microbiology: Recent Results (from the past 240 hour(s))  Blood Culture (routine x 2)     Status: None (Preliminary result)   Collection Time: 01/17/19  2:30 AM  Result Value Ref Range Status   Specimen Description BLOOD LEFT ARM  Final   Special Requests   Final    BOTTLES DRAWN AEROBIC AND ANAEROBIC Blood Culture adequate volume   Culture   Final    NO GROWTH 3 DAYS Performed at [facility], [address] [place], [place] [address]    Report Status PENDING  Incomplete  Blood Culture (routine x 2)     Status: None (Preliminary result)   Collection Time: 01/17/19  2:35 AM  Result Value Ref Range Status   Specimen Description BLOOD RIGHT HAND  Final   Special Requests   Final    BOTTLES DRAWN AEROBIC AND ANAEROBIC Blood Culture adequate volume   Culture   Final    NO GROWTH 3 DAYS Performed at [facility], [place] [address]  [place],  [address]    Report Status PENDING  Incomplete  SARS Coronavirus 2 [facility] order, Performed in [place] hospital lab)     Status: None   Collection Time: 01/17/19  2:39 AM  Result Value Ref Range Status   SARS Coronavirus 2 NEGATIVE NEGATIVE Final    Comment: (NOTE) If result is NEGATIVE SARS-CoV-2 target nucleic acids are NOT DETECTED. The SARS-CoV-2 RNA is generally detectable in upper and lower  respiratory specimens during the acute phase of infection. The lowest  concentration of SARS-CoV-2 viral copies this assay can detect is 250  copies / mL. A negative result does not preclude SARS-CoV-2 infection  and should not be used as the sole basis for treatment or other  patient management decisions.  A negative result may occur with  improper specimen collection / handling, submission of specimen other  than nasopharyngeal swab, presence of viral mutation(s) within the  areas targeted by this assay, and inadequate number of viral copies  (<250 copies / mL). A negative result must be combined with clinical  observations, patient history, and epidemiological information. If result is POSITIVE SARS-CoV-2 target nucleic acids are DETECTED. The SARS-CoV-2 RNA is generally detectable in upper and lower  respiratory specimens dur ing the acute phase of infection.  Positive  results are indicative of active infection with SARS-CoV-2.  Clinical  correlation with patient history and other diagnostic information is  necessary to determine patient infection status.  Positive results do  not rule out bacterial infection or co-infection with other viruses. If result is PRESUMPTIVE POSTIVE SARS-CoV-2 nucleic acids MAY BE PRESENT.   A presumptive positive result was obtained on the submitted specimen  and confirmed on repeat testing.  While 2019 novel coronavirus  (SARS-CoV-2) nucleic acids may be present in the submitted sample  additional confirmatory testing may be necessary  for epidemiological  and / or clinical management purposes  to differentiate between  SARS-CoV-2 and other Sarbecovirus currently known to infect humans.  If clinically indicated additional testing with an alternate test  methodology [phone]) is advised. The SARS-CoV-2 RNA is generally  detectable in upper and lower respiratory sp ecimens during the acute  phase of infection. The expected result is Negative. Fact Sheet for Patients:  StrictlyIdeas.no Fact Sheet for Healthcare Providers: BankingDealers.co.za This test is not yet approved or cleared by the [place] FDA and has been authorized for detection and/or diagnosis of SARS-CoV-2 by FDA under an Emergency Use Authorization (EUA).  This EUA will remain in effect (meaning this test can be used) for the duration of the COVID-19 declaration under Section 564(b)(1) of the Act, 21 U.S.C. section 360bbb-3(b)(1), unless the authorization is terminated or revoked sooner. Performed at [facility], [place] [address], [place], [place] [address]   Urine culture     Status: Abnormal   Collection Time: 01/17/19  3:59 AM  Result Value Ref Range Status   Specimen Description URINE, CLEAN CATCH  Final   Special Requests   Final    NONE Performed at [facility], [place] [address] [place], [place] [address]    Culture MULTIPLE SPECIES PRESENT, SUGGEST RECOLLECTION (A)  Final   Report Status 01/18/2019 FINAL  Final  Culture, respiratory     Status: None (Preliminary result)   Collection Time: 01/20/19  1:34 PM  Result Value Ref Range Status   Specimen Description BRONCHIAL ALVEOLAR LAVAGE  Final   Special Requests NONE  Final   Gram Stain   Final    MODERATE WBC PRESENT,BOTH PMN AND MONONUCLEAR RARE GRAM POSITIVE COCCI Performed at [facility], [place] [address], [place], [place] [address]    Culture PENDING  Incomplete   Report Status PENDING  Incomplete     Labs: BNP  (last 3 results) No results for input(s): BNP in the last 8760 hours. Basic Metabolic Panel: Recent Labs  Lab 01/17/19 0230 01/18/19 0544 01/19/19 0603 01/20/19 0529 01/21/19 0417  NA 141 140 143 141 139  K 3.8 3.5 3.5 3.2* 3.8  CL 104 104 106 105 108  CO2 _0 GLUCOSE 179* 137* 104* 119* 266*  BUN _1 CREATININE 1.17* 1.19* 1.15* 0.95 1.18*  CALCIUM 8.7* 8.0* 8.1* 8.0* 8.1*  MG  --   --  1.8  --  1.8   Liver Function Tests: Recent Labs  Lab 01/17/19 0230 01/19/19 0603 01/21/19 0417  AST _2 ALT _3 ALKPHOS 70 53 56  BILITOT 0.9 0.4 0.3  PROT 7.8 6.3* 6.4*  ALBUMIN 2.7* 2.0* 1.9*   No results for input(s): LIPASE, AMYLASE in the last 168 hours. No results for input(s): AMMONIA in the last 168 hours. CBC: Recent Labs  Lab 01/17/19 0230 01/18/19 0544 01/19/19 0603 01/20/19 0529 01/21/19 0417  WBC 11.6* 11.1* 11.4* 9.9 11.7*  NEUTROABS 8.3*  --   --   --   --   HGB 12.5 10.7* 10.6* 10.3* 10.6*  HCT 38.9 34.1* 33.9* 32.8* 33.9*  MCV 81.9 82.4 82.5 81.6 81.9  PLT 205 219 220 251 255   Cardiac Enzymes: No results for input(s): CKTOTAL, CKMB, CKMBINDEX, TROPONINI in the last 168 hours. BNP: Invalid input(s): POCBNP CBG: Recent Labs  Lab 01/20/19 1130 01/20/19 1442 01/20/19 1613 01/20/19 2141 01/21/19 0742  GLUCAP 102* 87 103*  306* 163*   D-Dimer No results for input(s): DDIMER in the last 72 hours. Hgb A1c No results for input(s): HGBA1C in the last 72 hours. Lipid Profile No results for input(s): CHOL, HDL, LDLCALC, TRIG, CHOLHDL, LDLDIRECT in the last 72 hours. Thyroid function studies No results for input(s): TSH, T4TOTAL, T3FREE, THYROIDAB in the last 72 hours.  Invalid input(s): FREET3 Anemia work up No results for input(s): VITAMINB12, FOLATE, FERRITIN, TIBC, IRON, RETICCTPCT in the last 72 hours. Urinalysis    Component Value Date/Time   COLORURINE YELLOW 01/17/2019 0359   APPEARANCEUR CLOUDY (A)  01/17/2019 0359   LABSPEC 1.024 01/17/2019 0359   PHURINE 5.0 01/17/2019 0359   GLUCOSEU NEGATIVE 01/17/2019 0359   HGBUR SMALL (A) 01/17/2019 0359   BILIRUBINUR NEGATIVE 01/17/2019 0359   BILIRUBINUR negative 08/19/2018 1156   KETONESUR NEGATIVE 01/17/2019 0359   PROTEINUR 100 (A) 01/17/2019 0359   UROBILINOGEN 0.2 08/19/2018 1156   NITRITE NEGATIVE 01/17/2019 0359   LEUKOCYTESUR LARGE (A) 01/17/2019 0359   Sepsis Labs Invalid input(s): PROCALCITONIN,  WBC,  LACTICIDVEN Microbiology Recent Results (from the past 240 hour(s))  Blood Culture (routine x 2)     Status: None (Preliminary result)   Collection Time: 01/17/19  2:30 AM  Result Value Ref Range Status   Specimen Description BLOOD LEFT ARM  Final   Special Requests   Final    BOTTLES DRAWN AEROBIC AND ANAEROBIC Blood Culture adequate volume   Culture   Final    NO GROWTH 3 DAYS Performed at [facility], [place] [address], [place], [place] [address]    Report Status PENDING  Incomplete  Blood Culture (routine x 2)     Status: None (Preliminary result)   Collection Time: 01/17/19  2:35 AM  Result Value Ref Range Status   Specimen Description BLOOD RIGHT HAND  Final   Special Requests   Final    BOTTLES DRAWN AEROBIC AND ANAEROBIC Blood Culture adequate volume   Culture   Final    NO GROWTH 3 DAYS Performed at [facility], [place] [address], [place], [place] [address]    Report Status PENDING  Incomplete  SARS Coronavirus 2 [facility] order, Performed in [place] hospital lab)     Status: None   Collection Time: 01/17/19  2:39 AM  Result Value Ref Range Status   SARS Coronavirus 2 NEGATIVE NEGATIVE Final    Comment: (NOTE) If result is NEGATIVE SARS-CoV-2 target nucleic acids are NOT DETECTED. The SARS-CoV-2 RNA is generally detectable in upper and lower  respiratory specimens during the acute phase of infection. The lowest  concentration of SARS-CoV-2 viral copies this assay can detect is 250  copies /  mL. A negative result does not preclude SARS-CoV-2 infection  and should not be used as the sole basis for treatment or other  patient management decisions.  A negative result may occur with  improper specimen collection / handling, submission of specimen other  than nasopharyngeal swab, presence of viral mutation(s) within the  areas targeted by this assay, and inadequate number of viral copies  (<250 copies / mL). A negative result must be combined with clinical  observations, patient history, and epidemiological information. If result is POSITIVE SARS-CoV-2 target nucleic acids are DETECTED. The SARS-CoV-2 RNA is generally detectable in upper and lower  respiratory specimens dur ing the acute phase of infection.  Positive  results are indicative of active infection with SARS-CoV-2.  Clinical  correlation with patient history and other  diagnostic information is  necessary to determine patient infection status.  Positive results do  not rule out bacterial infection or co-infection with other viruses. If result is PRESUMPTIVE POSTIVE SARS-CoV-2 nucleic acids MAY BE PRESENT.   A presumptive positive result was obtained on the submitted specimen  and confirmed on repeat testing.  While 2019 novel coronavirus  (SARS-CoV-2) nucleic acids may be present in the submitted sample  additional confirmatory testing may be necessary for epidemiological  and / or clinical management purposes  to differentiate between  SARS-CoV-2 and other Sarbecovirus currently known to infect humans.  If clinically indicated additional testing with an alternate test  methodology [phone]) is advised. The SARS-CoV-2 RNA is generally  detectable in upper and lower respiratory sp ecimens during the acute  phase of infection. The expected result is Negative. Fact Sheet for Patients:  StrictlyIdeas.no Fact Sheet for Healthcare Providers: BankingDealers.co.za This test is  not yet approved or cleared by the [place] FDA and has been authorized for detection and/or diagnosis of SARS-CoV-2 by FDA under an Emergency Use Authorization (EUA).  This EUA will remain in effect (meaning this test can be used) for the duration of the COVID-19 declaration under Section 564(b)(1) of the Act, 21 U.S.C. section 360bbb-3(b)(1), unless the authorization is terminated or revoked sooner. Performed at [facility], [place] [address] [place], [place] [address]   Urine culture     Status: Abnormal   Collection Time: 01/17/19  3:59 AM  Result Value Ref Range Status   Specimen Description URINE, CLEAN CATCH  Final   Special Requests   Final    NONE Performed at [facility], [place] [address] [place], [place] [address]    Culture MULTIPLE SPECIES PRESENT, SUGGEST RECOLLECTION (A)  Final   Report Status 01/18/2019 FINAL  Final  Culture, respiratory     Status: None (Preliminary result)   Collection Time: 01/20/19  1:34 PM  Result Value Ref Range Status   Specimen Description BRONCHIAL ALVEOLAR LAVAGE  Final   Special Requests NONE  Final   Gram Stain   Final    MODERATE WBC PRESENT,BOTH PMN AND MONONUCLEAR RARE GRAM POSITIVE COCCI Performed at [facility], [place] [address] [place], [place] [address]    Culture PENDING  Incomplete   Report Status PENDING  Incomplete     Time coordinating discharge: 45 minutes spent in the coordination of this discharge today.   SIGNED:   Jonnie Finner, DO  [facility] 01/21/2019, 8:55 AM Pager   If 7PM-7AM, please contact night-coverage [email or web address] Password [email or web address]

## 2019-01-21 NOTE — TOC Transition Note (Signed)
Transition of Care [facility]) - CM/SW Discharge Note   Patient Details  Name: Desiree Harrison MRN: [id number] Date of Birth: 05/05/1946  [facility] CM/SW Contact:  Pollie Friar, RN Phone Number: 01/21/2019, 1:19 PM   Clinical Narrative:    Pt discharging home today. Pt has PcP f/u, insurance and transportation home.   Final next level of care: Home/Self Care Barriers to Discharge: Barriers Resolved   Patient Goals and CMS Choice        Discharge Placement                       Discharge Plan and Services                                     Social Determinants of Health (SDOH) Interventions     Readmission Risk Interventions No flowsheet data found.

## 2019-01-22 ENCOUNTER — Telehealth: Payer: Self-pay

## 2019-01-22 LAB — CULTURE, BLOOD (ROUTINE X 2)
Culture: NO GROWTH
Culture: NO GROWTH
Special Requests: ADEQUATE
Special Requests: ADEQUATE

## 2019-01-22 LAB — CULTURE, RESPIRATORY W GRAM STAIN

## 2019-01-22 NOTE — Telephone Encounter (Signed)
lmom for prescreen  

## 2019-01-22 NOTE — Telephone Encounter (Signed)
1. Do you currently have a fever? NO (yes = cancel and refer to pcp for e-visit) 2. Have you recently travelled on a cruise, internationally, or to [place], [place], [place], [place], [place], or [place], [place] [employer]) ? NO (yes = cancel, stay home, monitor symptoms, and contact pcp or initiate e-visit if symptoms develop) 3. Have you been in contact with someone that is currently pending confirmation of Covid19 testing or has been confirmed to have the [place] virus?  NO (yes = cancel, stay home, away from tested individual, monitor symptoms, and contact pcp or initiate e-visit if symptoms develop) 4. Are you currently experiencing fatigue or cough? NO (yes = pt should be prepared to have a mask placed at the time of their visit).  *recent discharge from hospital pneumonia* COVID negative per discharge summary*  Pt. Advised that we are restricting visitors at this time and anyone present in the vehicle should meet the above criteria as well. Advised that visit will be at curbside for finger stick ONLY and will receive call with instructions. Pt also advised to please bring own pen for signature of arrival document.

## 2019-01-22 NOTE — Telephone Encounter (Signed)
Left message to call back  

## 2019-01-23 ENCOUNTER — Other Ambulatory Visit: Payer: Self-pay

## 2019-01-23 ENCOUNTER — Ambulatory Visit (INDEPENDENT_AMBULATORY_CARE_PROVIDER_SITE_OTHER): Payer: Medicare Other | Admitting: *Deleted

## 2019-01-23 DIAGNOSIS — Z7901 Long term (current) use of anticoagulants: Secondary | ICD-10-CM | POA: Diagnosis not present

## 2019-01-23 DIAGNOSIS — I253 Aneurysm of heart: Secondary | ICD-10-CM

## 2019-01-23 LAB — POCT INR: INR: 1.4 — AB (ref 2.0–3.0)

## 2019-01-23 NOTE — Patient Instructions (Signed)
Description   Spoke with pt and instructed pt to take an extra 1/2 tablet tomorrow then resume taking 1 tablet daily except 1.5 tablets each Sunday, Wednesday, and Fridays.  Repeat INR in on Tuesday (started doxy 100mg  bid on 5/14).

## 2019-01-26 ENCOUNTER — Telehealth: Payer: Self-pay

## 2019-01-26 ENCOUNTER — Other Ambulatory Visit: Payer: Medicare Other

## 2019-01-26 NOTE — Telephone Encounter (Signed)
lmom for prescreen  

## 2019-01-27 ENCOUNTER — Other Ambulatory Visit: Payer: Self-pay

## 2019-01-27 ENCOUNTER — Ambulatory Visit (INDEPENDENT_AMBULATORY_CARE_PROVIDER_SITE_OTHER): Payer: Medicare Other | Admitting: Pharmacist

## 2019-01-27 DIAGNOSIS — I253 Aneurysm of heart: Secondary | ICD-10-CM

## 2019-01-27 DIAGNOSIS — Z7901 Long term (current) use of anticoagulants: Secondary | ICD-10-CM | POA: Diagnosis not present

## 2019-01-27 LAB — POCT INR: INR: 1.6 — AB (ref 2.0–3.0)

## 2019-01-28 ENCOUNTER — Other Ambulatory Visit: Payer: Self-pay

## 2019-01-28 ENCOUNTER — Telehealth: Payer: Self-pay | Admitting: Physician Assistant

## 2019-01-28 ENCOUNTER — Telehealth: Payer: Self-pay | Admitting: Cardiovascular Disease

## 2019-01-28 ENCOUNTER — Ambulatory Visit (INDEPENDENT_AMBULATORY_CARE_PROVIDER_SITE_OTHER): Payer: Medicare Other | Admitting: Family Medicine

## 2019-01-28 DIAGNOSIS — J181 Lobar pneumonia, unspecified organism: Secondary | ICD-10-CM | POA: Diagnosis not present

## 2019-01-28 DIAGNOSIS — R059 Cough, unspecified: Secondary | ICD-10-CM

## 2019-01-28 DIAGNOSIS — E1159 Type 2 diabetes mellitus with other circulatory complications: Secondary | ICD-10-CM

## 2019-01-28 DIAGNOSIS — Z95810 Presence of automatic (implantable) cardiac defibrillator: Secondary | ICD-10-CM

## 2019-01-28 DIAGNOSIS — J189 Pneumonia, unspecified organism: Secondary | ICD-10-CM

## 2019-01-28 DIAGNOSIS — I10 Essential (primary) hypertension: Secondary | ICD-10-CM

## 2019-01-28 DIAGNOSIS — Z794 Long term (current) use of insulin: Secondary | ICD-10-CM

## 2019-01-28 DIAGNOSIS — R05 Cough: Secondary | ICD-10-CM | POA: Diagnosis not present

## 2019-01-28 MED ORDER — HYDROCOD POLST-CPM POLST ER 10-8 MG/5ML PO SUER: 5.0000 mL | Freq: Two times a day (BID) | ORAL | 0 refills | Status: DC

## 2019-01-28 MED ORDER — BENZONATATE 100 MG PO CAPS: 100.0000 mg | ORAL_CAPSULE | Freq: Three times a day (TID) | ORAL | 1 refills | Status: DC | PRN

## 2019-01-28 MED ORDER — HYDROCOD POLST-CPM POLST ER 10-8 MG/5ML PO SUER: 5.0000 mL | Freq: Two times a day (BID) | ORAL | 0 refills | Status: AC

## 2019-01-28 NOTE — Telephone Encounter (Signed)
I spoke with the patient to confirm she was aware of her post hospital visit on 01/30/19 at 3:00 pm with Almyra Deforest, PA. She was aware of this, but was unsure if she needed to come in or was this an e-visit.  I advised that this will be a virtual visit. She prefers a phone visit.  She is aware to check her BP/ HR/ weight the morning of her visit and have these ready, along with her current list of medications for her visit on 01/30/19.

## 2019-01-28 NOTE — Telephone Encounter (Signed)
Mychart, no smartphone, consent, pre reg complete 01/28/19 AF

## 2019-01-28 NOTE — Progress Notes (Deleted)
Called patient to initiate their telephone visit with provider Molli Barrows, FNP-C. Verified date of birth. States that she is feeling much better. Still has a lingering cough but the fever & SHOB has resolved. KWalker, CMA.

## 2019-01-28 NOTE — Telephone Encounter (Signed)
I have spoken with the patient. She is agreeable with a telephone visit with Almyra Deforest, PA on 01/28/2019 at 3:00 pm.     Virtual Visit Pre-Appointment Phone Call  "(Name), I am calling you today to discuss your upcoming appointment. We are currently trying to limit exposure to the virus that causes COVID-19 by seeing patients at home rather than in the office."  1. "What is the BEST phone number to call the day of the visit?" - include this in appointment notes  2. "Do you have or have access to (through a family member/friend) a smartphone with video capability that we can use for your visit?" a. If yes - list this number in appt notes as "cell" (if different from BEST phone #) and list the appointment type as a VIDEO visit in appointment notes b. If no - list the appointment type as a PHONE visit in appointment notes  3. Confirm consent - "In the setting of the current Covid19 crisis, you are scheduled for a (phone or video) visit with your provider on (date) at (time).  Just as we do with many in-office visits, in order for you to participate in this visit, we must obtain consent.  If you'd like, I can send this to your mychart (if signed up) or email for you to review.  Otherwise, I can obtain your verbal consent now.  All virtual visits are billed to your insurance company just like a normal visit would be.  By agreeing to a virtual visit, we'd like you to understand that the technology does not allow for your provider to perform an examination, and thus may limit your provider's ability to fully assess your condition. If your provider identifies any concerns that need to be evaluated in person, we will make arrangements to do so.  Finally, though the technology is pretty good, we cannot assure that it will always work on either your or our end, and in the setting of a video visit, we may have to convert it to a phone-only visit.  In either situation, we cannot ensure that we have a secure  connection.  Are you willing to proceed?" STAFF: Did the patient verbally acknowledge consent to telehealth visit? Document YES/NO here: Yes     TELEPHONE CALL NOTE  Amadi Yoshino has been deemed a candidate for a follow-up tele-health visit to limit community exposure during the Covid-19 pandemic. I spoke with the patient via phone to ensure availability of phone/video source, confirm preferred email & phone number, and discuss instructions and expectations.  I reminded Wendee Hata to be prepared with any vital sign and/or heart rhythm information that could potentially be obtained via home monitoring, at the time of her visit. I reminded Fergie Sherbert to expect a phone call prior to her visit.  Alvis Lemmings, RN 01/28/2019 9:46 AM

## 2019-01-28 NOTE — Progress Notes (Signed)
Virtual Visit via Telephone Note  I connected with Desiree Harrison on 01/28/19 at 10:50 AM EDT by telephone and verified that I am speaking with the correct person using two identifiers.  Location: Patient: Located at home during today's encounter  Provider: Located at primary care office    I discussed the limitations, risks, security and privacy concerns of performing an evaluation and management service by telephone and the availability of in person appointments. I also discussed with the patient that there may be a patient responsible charge related to this service. The patient expressed understanding and agreed to proceed.   Desiree Harrison has Chronic combined systolic and diastolic heart failure ([place]); Diabetes mellitus ([place]); Essential hypertension; Ventricular aneurysm; Long term current use of anticoagulant therapy; Hypothyroidism; Hyperlipidemia; Pneumonia of both lungs due to infectious organism; ICD (implantable cardioverter-defibrillator) in place; Long term (current) use of anticoagulants; Upper airway cough syndrome; Pulmonary infiltrates on CXR; DOE (dyspnea on exertion); CAP (community acquired pneumonia); Sepsis due to pneumonia ([place]); and Pneumonia.    History of Present Illness: Hospital follow-up Desiree Harrison was admitted to impatient services 01/17/19 after presenting to the ER with symptoms shortness of breath and fever.   Desiree Harrison has been diagnosed with pneumonia intermittently since 07/27/2018. Most recently, 11/12/18, Desiree Harrison was diagnosed with RUL pneumonia at urgent care. She completed a course of dual antibiotic therapy and repeat CXR on 11/24/18 indicated minimal improvement of RUL pneumonia. Desiree Harrison was referred to pulmonology and a CT of chest was ordered. Due to COVID-19 both were postponed. Seen by pulmonology, Desiree Harrison, 4/21-treated again with course of Levaquin and prednisone, following a chest x-ray revealed persistent RUL opacity. Following the course of treatment by Dr.  Melvyn Harrison, patient continued to complain of dyspnea. A new CT of chest was ordered and the underlying diagnosis for her dyspnea was thought to be related to obesity and deconditioning.   Hospital course  Desiree Harrison was diagnosed with sepsis due to PNA. COVID-19 testing negative. Underwent a bronchoscopy, AFB smear and fungal cultures. She was discharged home with cefdinir. Bronchial Alveolar Lavage culture grew MRSA. Cefdinir discontinued and doxycyline started. Desiree Harrison is currently taking doxycyline. A biopsy of the RUL was performed. RUL pathology significant for scant benign bronchial wall with mild nonspecific inflammation. No evidence of malignancy. She is scheduled to follow-up with pulmonology 02/09/19. Repeat CT recommended 6-8 weeks to ensure resolution of RUL PNA. Pulmonary nodule measuring 1.8 cm right middle lobe requires follow-up with biopsy and or PET.  Desiree Harrison complains of lingering cough although notes improvement since hospital discharge. She is mainly taking tessalon pearls throughout the day and Tussionex at bedtime. She has remained afebrile. Dyspnea has resolved and she is able to tolerate activity.   Dg Chest 2 View  Result Date: 01/12/2019 CLINICAL DATA:  Dyspnea on exertion. EXAM: CHEST - 2 VIEW COMPARISON:  Radiographs of November 24, 2018. FINDINGS: The heart size and mediastinal contours are within normal limits. Stable position of single lead left-sided pacemaker. No pneumothorax or pleural effusion is noted. Left lung is clear. Stable ill-defined right upper lobe opacity is noted; given the lack of change, it is unlikely that this represents pneumonia. The visualized skeletal structures are unremarkable. IMPRESSION: Stable ill-defined right upper lobe opacity is noted. Given the lack of change since prior exam, it is unlikely this represents pneumonia. CT scan of the chest is recommend to rule out other possible pathology, such as neoplasm. These results will be called to the ordering clinician  or representative by the Radiologist  Assistant, and communication documented in the PACS or zVision Dashboard. Electronically Signed   By: Marijo Conception M.D.   On: 01/12/2019 09:29   Ct Chest Wo Contrast  Result Date: 01/17/2019 CLINICAL DATA:  73 y/o F; 3 months of productive cough. History of pneumonia. EXAM: CT CHEST WITHOUT CONTRAST TECHNIQUE: Multidetector CT imaging of the chest was performed following the standard protocol without IV contrast. COMPARISON:  01/17/2019 chest radiograph FINDINGS: Cardiovascular: No significant vascular findings. Normal heart size. No pericardial effusion. Mild aortic and moderate coronary artery calcific atherosclerosis. Single lead AICD noted. Mediastinum/Nodes: No enlarged mediastinal or axillary lymph nodes. Thyroid gland, trachea, and esophagus demonstrate no significant findings. Lungs/Pleura: Dense consolidation within the right upper lobe with surrounding ground-glass opacity along the major and minor fissures. Discrete nodule in the right middle lobe measuring 11 x 18 mm (series 4, image 25). Trace right pleural effusion. No pneumothorax. Upper Abdomen: Cholecystectomy. Moderate hiatal hernia. Musculoskeletal: No fracture is seen. IMPRESSION: 1. Large consolidation within the right upper lobe compatible with pneumonia. No necrosis. 2. Discrete nodule in the right middle lobe measuring up to 11 x 18 mm may be infectious/inflammatory. Follow-up CT chest after resolution of pneumonia is recommended to assess persistence. 3. Trace right pleural effusion. 4. Mild aortic and moderate coronary artery calcific atherosclerosis. 5. Moderate hiatal hernia. Electronically Signed   By: Kristine Garbe M.D.   On: 01/17/2019 04:46   Ct Chest High Resolution  Result Date: 01/17/2019 CLINICAL DATA:  Persistent cough EXAM: CT CHEST WITHOUT CONTRAST TECHNIQUE: Multidetector CT imaging of the chest was performed following the standard protocol without intravenous contrast.  High resolution imaging of the lungs, as well as inspiratory and expiratory imaging, was performed. COMPARISON:  CT chest, 01/17/2019 FINDINGS: Cardiovascular: Scattered coronary artery calcifications. Normal heart size. No pericardial effusion. Mediastinum/Nodes: Enlarged right hilar lymph nodes with prominent although not pathologically enlarged mediastinal lymph nodes. Thyroid gland, trachea, and esophagus demonstrate no significant findings. Lungs/Pleura: No significant change in a dense, masslike consolidation of the right upper lobe. Redemonstrated discrete pulmonary nodule in the adjacent right middle lobe measuring 1.8 cm (series 8, image 62). Small right pleural effusion. No significant evidence of interstitial lung disease or air trapping. Upper Abdomen: No acute abnormality. Musculoskeletal: No chest wall mass or suspicious bone lesions identified. IMPRESSION: 1. Unchanged CT examination, in comparison to same day prior. No significant change in a dense, masslike consolidation of the right upper lobe, most consistent with lobar pneumonia, although underlying mass is not strictly excluded. Recommend follow-up at 6-8 weeks to ensure complete resolution. 2. Redemonstrated discrete pulmonary nodule in the adjacent right middle lobe measuring 1.8 cm (series 8, image 62). Given size, this will require follow-up to establish stability, which can be performed in conjunction with above. PET-CT or biopsy may be considered given overall concern for malignancy. 3.  Small right pleural effusion. 4. No significant evidence of interstitial lung disease or air trapping on this tailored ILD protocol examination. Electronically Signed   By: Eddie Candle M.D.   On: 01/17/2019 13:37   Dg Chest Port 1 View  Result Date: 01/20/2019 CLINICAL DATA:  Post bronchoscopy with biopsy. EXAM: PORTABLE CHEST 1 VIEW COMPARISON:  01/17/2019 FINDINGS: Persistent airspace disease in the periphery of the right upper lung has minimally  changed. Negative for a pneumothorax. Stable appearance of the left cardiac ICD. Central vascular structures are mildly enlarged. Trachea is midline. Heart size is stable. Haziness at the left lung base. IMPRESSION: 1. Negative for  pneumothorax following bronchoscopy and biopsy. 2. Right lung airspace disease has minimally changed. 3. Enlargement of the central vascular structures suggesting vascular congestion. 4. New haziness at the left lung base is likely related to atelectasis. Electronically Signed   By: Markus Daft M.D.   On: 01/20/2019 14:47   Dg Chest Portable 1 View  Result Date: 01/17/2019 CLINICAL DATA:  73 year old female with shortness of breath and cough. EXAM: PORTABLE CHEST 1 VIEW COMPARISON:  Chest radiograph dated 01/12/2019 FINDINGS: There is a patchy area of airspace opacity in the right upper lobe which is new since the prior radiograph of 01/12/2019 most consistent with pneumonia. Clinical correlation and follow-up to resolution recommended. There is no pleural effusion or pneumothorax. Stable mild cardiomegaly. Left pectoral AICD device. No acute osseous pathology. IMPRESSION: Right upper lobe pneumonia.  Follow-up to resolution recommended. Electronically Signed   By: Anner Crete M.D.   On: 01/17/2019 02:32   Ct Maxillofacial Wo Contrast  Result Date: 01/17/2019 CLINICAL DATA:  Persistent cough and sinus problems. EXAM: CT MAXILLOFACIAL WITHOUT CONTRAST TECHNIQUE: Multidetector CT images of the paranasal sinuses were obtained using the standard protocol without intravenous contrast. COMPARISON:  None. FINDINGS: Paranasal sinuses: Frontal: Normally aerated. Patent frontal sinus drainage pathways. Ethmoid: Normally aerated. Maxillary: Normally aerated. Sphenoid: Trace mucosal thickening in the region of the sphenoid sinus ostia and sphenoethmoidal recesses, otherwise clear bilaterally. No fluid. Right ostiomeatal unit: Patent. Left ostiomeatal unit: Patent. Nasal passages: Patent.  Asymmetric mild right inferior nasal turbinate hypertrophy. Leftward nasal septal deviation/mild spurring contacting the left inferior turbinate. Other: Clear mastoid air cells and tympanic cavities. Bilateral cataract extraction. Grossly unremarkable included portion of the brain. IMPRESSION: No significant paranasal sinus inflammatory disease. Electronically Signed   By: Logan Bores M.D.   On: 01/17/2019 11:39   Dg C-arm Bronchoscopy  Result Date: 01/20/2019 C-ARM BRONCHOSCOPY: Fluoroscopy was utilized by the requesting physician.  No radiographic interpretation.     Type 2 diabetes and Hypertension  Rheanne monitors glucose at home. During recent impatient stay, Annett's diabetes regimen was changed. Current medication regimen is insulin 50/50-30 units BID (previously prescribed 60 units BID) and rapid acting insulin 10 units TID with meals (hold for sugars less than 125). Last A1C 9.0. Since her last A1C level was checked, she has been on another course of prednisone for management of dyspnea. Blood sugar last night 82 and fasting AM 150. Denies 3 P's or neuropathic pain symptoms.  Zaleigh is checking her blood pressure at home. Blood pressure readings have mostly ranged less than 140/90. No hypotension. During recent hospital admission, BP were boarder-line hypotensive. Amlodipine was d/c. Currently taking carvedilol only. Jeronica has a ICD due to CHF which has been stable and thought not to be related to recent persistent cough and dyspnea. Courteney denies chest pain, dizziness, new weakness, or headaches.   Assessment and Plan: 1. Cough, persistent  Cough is lingering secondary to PNA. Continue Tessalon and Tussionex at bedtime. Keep follow-up with pulmonology  2. Type 2 diabetes mellitus with other circulatory complication, with long-term current use of insulin (HCC) Last A1C 9.0 March 2020.  Repeat A1C 4 weeks. Continue current regimen. Hold short acting insulin 10 units for any readings 125  or less. Continue 50/50 insulin 30 units BID   3. ICD (implantable cardioverter-defibrillator) in place -Followed by cardiology  4. Community acquired pneumonia of right upper lobe of lung ([place]) -Recent bronchial lavage positive for MRSA -Complete doxycyline 100 mg BID. -Repeat CT of chest 6-8 weeks  to ensured resolution of RUL PNA  5. Essential hypertension -Continue to hold amlodipine for now. Will re-evaluate need to resume at follow-up visit.  -Continue Carvedilol We have discussed target BP range and blood pressure goal. I have advised patient to check BP regularly and to call us back or report to clinic if the numbers are consistently higher than 140/90. We discussed the importance of compliance with medical therapy and DASH diet recommended,    Meds ordered this encounter  Medications  . DISCONTD: chlorpheniramine-HYDROcodone (TUSSIONEX) 10-8 MG/5ML SUER    Sig: Take 5 mLs by mouth 2 (two) times daily for 7 days.    Dispense:  70 mL    Refill:  0  . benzonatate (TESSALON) 100 MG capsule    Sig: Take 1-2 capsules (100-200 mg total) by mouth 3 (three) times daily as needed for cough.    Dispense:  60 capsule    Refill:  1  . chlorpheniramine-HYDROcodone (TUSSIONEX) 10-8 MG/5ML SUER    Sig: Take 5 mLs by mouth 2 (two) times daily for 7 days.    Dispense:  70 mL    Refill:  0   Follow-up: 1 month diabetes and hypertension      I discussed the assessment and treatment plan with the patient. The patient was provided an opportunity to ask questions and all were answered. The patient agreed with the plan and demonstrated an understanding of the instructions.   The patient was advised to call back or seek an in-person evaluation if the symptoms worsen or if the condition fails to improve as anticipated.  I provided 30 minutes of non-face-to-face time during this encounter.   Molli Barrows, FNP

## 2019-01-29 ENCOUNTER — Other Ambulatory Visit: Payer: Self-pay | Admitting: *Deleted

## 2019-01-29 ENCOUNTER — Encounter: Payer: Self-pay | Admitting: *Deleted

## 2019-01-29 NOTE — Progress Notes (Signed)
The proposed treatment discussed at cancer conference 01/29/2019 is for discussion purpose only and is not a binding recommendation.  The patient was not physically examined nor present for their treatment options.  Therefore, final treatment plans cannot be decided.  Dr. Vaughan Browner updated on recommendations

## 2019-01-30 ENCOUNTER — Telehealth (INDEPENDENT_AMBULATORY_CARE_PROVIDER_SITE_OTHER): Payer: Medicare Other | Admitting: Physician Assistant

## 2019-01-30 VITALS — BP 160/100 | Ht 67.0 in | Wt 205.0 lb

## 2019-01-30 DIAGNOSIS — J9602 Acute respiratory failure with hypercapnia: Secondary | ICD-10-CM

## 2019-01-30 DIAGNOSIS — I5022 Chronic systolic (congestive) heart failure: Secondary | ICD-10-CM

## 2019-01-30 DIAGNOSIS — J9601 Acute respiratory failure with hypoxia: Secondary | ICD-10-CM

## 2019-01-30 DIAGNOSIS — I10 Essential (primary) hypertension: Secondary | ICD-10-CM

## 2019-01-30 DIAGNOSIS — Z95810 Presence of automatic (implantable) cardiac defibrillator: Secondary | ICD-10-CM

## 2019-01-30 DIAGNOSIS — E119 Type 2 diabetes mellitus without complications: Secondary | ICD-10-CM

## 2019-01-30 DIAGNOSIS — I2510 Atherosclerotic heart disease of native coronary artery without angina pectoris: Secondary | ICD-10-CM

## 2019-01-30 DIAGNOSIS — E785 Hyperlipidemia, unspecified: Secondary | ICD-10-CM

## 2019-01-30 MED ORDER — ATORVASTATIN CALCIUM 80 MG PO TABS: 80.0000 mg | ORAL_TABLET | Freq: Every day | ORAL | 3 refills | Status: DC

## 2019-01-30 MED ORDER — AMLODIPINE BESYLATE 5 MG PO TABS: 5.0000 mg | ORAL_TABLET | Freq: Every day | ORAL | 3 refills | Status: DC

## 2019-01-30 NOTE — Progress Notes (Signed)
Virtual Visit via Telephone Note   This visit type was conducted due to national recommendations for restrictions regarding the COVID-19 Pandemic (e.g. social distancing) in an effort to limit this patient's exposure and mitigate transmission in our community.  Due to her co-morbid illnesses, this patient is at least at moderate risk for complications without adequate follow up.  This format is felt to be most appropriate for this patient at this time.  The patient did not have access to video technology/had technical difficulties with video requiring transitioning to audio format only (telephone).  All issues noted in this document were discussed and addressed.  No physical exam could be performed with this format.  Please refer to the patient's chart for her  consent to telehealth for [facility].   Date:  01/30/2019   ID:  Desiree Harrison, DOB [birth year], MRN [id number]  Patient Location: Home Provider Location: Home  PCP:  Scot Jun, FNP  Cardiologist:  Sanda Klein, MD  Electrophysiologist:  None   Evaluation Performed:  Follow-Up Visit  Chief Complaint:  followup  History of Present Illness:    Desiree Harrison is a 73 y.o. female with PMH of CAD, ischemic cardiomyopathy s/p [employer] ICD in February 8469, chronic systolic heart failure, HLD and DM2.  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  She did not have chest pain at the time, however her anginal equivalent was nausea and vomiting along with shortness of breath.  She was found to have persistently occluded LAD that not amenable to PCI.  There was no other significant coronary artery disease.  EF was 25 to 30% in the past however improved to 50% by echocardiogram in June 2018 with residual apical aneurysm.  Although she does not have any documented LV apical thrombus or embolic event, there was significant concern about this  possibility due to persistent aneurysmal apex.  She is on chronic warfarin anticoagulation therapy.  Patient was last seen by Dr. Sallyanne Kuster on 08/21/2018, her device was reprogrammed as a sharp box with therapy beginning only at 200 bpm.  She was continued on warfarin therapy at the time for embolic prevention.  More recently, patient was admitted in May with cough and fever.  She was previously treated with antibiotic for pneumonia in November.  Prior to hospitalization, she just finished a course of Levaquin as well.  CT scan showed consolidation in the right upper lobe consistent with pneumonia.  She was admitted for sepsis.  She was treated again with antibiotic which make it the fifth round of antibiotics since November.  She underwent bronchoscopy with pulmonology service, this did not reveal any endobronchial abnormality other than purulent secretions from the posterior segment of the right upper lobe.  Her COVID-19 test was negative.  She was discharged to finish a course of cefdinir and follow-up with PCP in 1 week.  BAL sample was later positive for MRSA, she was treated with another course of doxycycline 100 mg twice daily for 10 days.  Patient was contacted today via telephone visit.  Her breathing has improved since started on doxycycline.  She is still have 1 more day of doxycycline to go.  Otherwise she denies any chest discomfort, lower extremity edema, orthopnea or PND.  Several of her medication was discontinued recently due to low blood pressure.  This included amlodipine and for some reason Lipitor as well.  I will restart amlodipine at  5 mg daily and also the previous dose of Lipitor.  Her blood pressure is quite high at home.  The patient does not have symptoms concerning for COVID-19 infection (fever, chills, cough, or new shortness of breath).    Past Medical History:  Diagnosis Date  . Acute systolic heart failure ([place])   . CAD in native artery   . Cardiomyopathy ([place])   . Long  term (current) use of anticoagulants   . S/P internal cardiac defibrillator procedure    placed 10/28/15  . STEMI (ST elevation myocardial infarction) ([place])   . Type 2 diabetes mellitus, with long-term current use of insulin ([place])   . Ventricular aneurysm    Past Surgical History:  Procedure Laterality Date  . ABDOMINAL HYSTERECTOMY    . APPENDECTOMY    . BREAST BIOPSY Left   . CARDIAC CATHETERIZATION    . CHOLECYSTECTOMY    . VIDEO BRONCHOSCOPY WITH ENDOBRONCHIAL ULTRASOUND N/A 01/20/2019   Procedure: VIDEO BRONCHOSCOPY WITH BIOPSY;  Surgeon: Marshell Garfinkel, MD;  Location: [facility];  Service: Pulmonary;  Laterality: N/A;     Current Meds  Medication Sig  . albuterol (PROVENTIL HFA;VENTOLIN HFA) 108 (90 Base) MCG/ACT inhaler Inhale 1-2 puffs into the lungs every 6 (six) hours as needed for wheezing or shortness of breath.  Marland Kitchen aspirin EC 81 MG tablet Take 81 mg by mouth daily.   . benzonatate (TESSALON) 100 MG capsule Take 1-2 capsules (100-200 mg total) by mouth 3 (three) times daily as needed for cough.  . carvedilol (COREG) 25 MG tablet Take 25 mg by mouth 2 (two) times daily with a meal.   . cetirizine (ZYRTEC) 10 MG tablet Take 10 mg by mouth at bedtime.  . chlorpheniramine-HYDROcodone (TUSSIONEX) 10-8 MG/5ML SUER Take 5 mLs by mouth 2 (two) times daily for 7 days.  Marland Kitchen doxycycline (VIBRAMYCIN) 100 MG capsule Take 1 capsule by mouth 2 (two) times a day.  . gabapentin (NEURONTIN) 100 MG capsule TAKE 2 CAPSULES BY MOUTH 2 TIMES DAILY (Patient taking differently: Take 200 mg by mouth 2 (two) times daily. )  . insulin lispro (HUMALOG) 100 UNIT/ML injection Inject 0.1 mLs (10 Units total) into the skin 3 (three) times daily before meals.  . insulin lispro protamine-lispro (HUMALOG 50/50 MIX) (50-50) 100 UNIT/ML SUSP injection Inject 0.3 mLs (30 Units total) into the skin 2 (two) times daily before a meal.  . Insulin Pen Needle (NOVOFINE) 30G X 8 MM MISC Inject 10 each into the skin as needed.   Marland Kitchen levothyroxine (SYNTHROID, LEVOTHROID) 50 MCG tablet Take 1 tablet (50 mcg total) by mouth daily.  Marland Kitchen omeprazole (PRILOSEC) 20 MG capsule Take 30- 60 min before your first and last meals of the day  . warfarin (COUMADIN) 3 MG tablet TAKE 1 TABLET BY MOUTH EVERY DAY AT 6 PM (Patient taking differently: Take 3-4.5 mg by mouth daily. Wed, Fri and Sun take 4.5 mg all other days take 3 mg)     Allergies:   Lisinopril   Social History   Tobacco Use  . Smoking status: Former Smoker    Packs/day: 0.25    Types: Cigarettes    Last attempt to quit: 12/30/1963    Years since quitting: 55.1  . Smokeless tobacco: Never Used  . Tobacco comment: pt smoked 5-6/cigs daily 12/30/2018  Substance Use Topics  . Alcohol use: Not Currently  . Drug use: Never     Family Hx: The patient's family history includes Breast cancer in her mother; Coronary  artery disease in her mother; Diabetes in her mother; Stroke in her father.  ROS:   Please see the history of present illness.     All other systems reviewed and are negative.   Prior CV studies:   The following studies were reviewed today:  CT of chest 01/17/2019 IMPRESSION: 1. Unchanged CT examination, in comparison to same day prior. No significant change in a dense, masslike consolidation of the right upper lobe, most consistent with lobar pneumonia, although underlying mass is not strictly excluded. Recommend follow-up at 6-8 weeks to ensure complete resolution.  2. Redemonstrated discrete pulmonary nodule in the adjacent right middle lobe measuring 1.8 cm (series 8, image 62). Given size, this will require follow-up to establish stability, which can be performed in conjunction with above. PET-CT or biopsy may be considered given overall concern for malignancy.  3.  Small right pleural effusion.  4. No significant evidence of interstitial lung disease or air trapping on this tailored ILD protocol examination.   Labs/Other Tests and  Data Reviewed:    EKG:  An ECG dated 01/19/2019 was personally reviewed today and demonstrated:  Normal sinus rhythm without significant ST-T wave changes  Recent Labs: 08/19/2018: TSH 4.120 01/12/2019: Pro B Natriuretic peptide (BNP) 57.0 01/21/2019: ALT 17; BUN 17; Creatinine, Ser 1.18; Hemoglobin 10.6; Magnesium 1.8; Platelets 255; Potassium 3.8; Sodium 139   Recent Lipid Panel Lab Results  Component Value Date/Time   CHOL 108 08/19/2018 09:19 AM   TRIG 136 08/19/2018 09:19 AM   HDL 32 (L) 08/19/2018 09:19 AM   CHOLHDL 3.4 08/19/2018 09:19 AM   LDLCALC 49 08/19/2018 09:19 AM    Wt Readings from Last 3 Encounters:  01/30/19 205 lb (93 kg)  01/19/19 215 lb 2 oz (97.6 kg)  01/12/19 213 lb 9.6 oz (96.9 kg)     Objective:    Vital Signs:  BP (!) 160/100   Ht _0  (1.702 m)   Wt 205 lb (93 kg)   BMI 32.11 kg/m    VITAL SIGNS:  reviewed  ASSESSMENT & PLAN:    1. Acute respiratory failure: Related to dual MRSA pneumonia.  Patient has been having recurrent pneumonias since November, she has finished at least 5 rounds of antibiotic by this point.  Finishing a course of doxycycline.  Her breathing is improving.  2. CAD: On aspirin and carvedilol.  No recent angina  3. Ischemic cardiomyopathy s/p [employer] ICD in February 2017: On carvedilol.  4. Chronic systolic heart failure: She denies any significant lower extremity edema, orthopnea or PND.  5. Hypertension: Blood pressure is quite high today.  Her amlodipine was discontinued during most recent hospitalization due to low blood pressure.  This will be restarted today.  6. Hyperlipidemia: her Lipitor was stopped recently, I did not see any evidence of liver injury based on blood work.  Will restart Lipitor.  7. DM 2: Managed by primary care provider  COVID-19 Education: The signs and symptoms of COVID-19 were discussed with the patient and how to seek care for testing (follow up with PCP or arrange E-visit).  The  importance of social distancing was discussed today.  Time:   Today, I have spent 14 minutes with the patient with telehealth technology discussing the above problems.     Medication Adjustments/Labs and Tests Ordered: Current medicines are reviewed at length with the patient today.  Concerns regarding medicines are outlined above.   Tests Ordered: No orders of the defined types were placed in this  encounter.   Medication Changes: No orders of the defined types were placed in this encounter.   Disposition:  Follow up in 2 month(s)  Signed, Almyra Deforest, [place]  01/30/2019 3:42 PM    [facility]

## 2019-01-30 NOTE — Patient Instructions (Signed)
Medication Instructions:   RESTART AMLODIPINE 5 Mg DAILY RESTART LIPITOR 80 Mg daily   If you need a refill on your cardiac medications before your next appointment, please call your pharmacy.   Lab work:  NONE ordered at this time of appointment   If you have labs (blood work) drawn today and your tests are completely normal, you will receive your results only by: Marland Kitchen MyChart Message (if you have MyChart) OR . A paper copy in the mail If you have any lab test that is abnormal or we need to change your treatment, we will call you to review the results.  Testing/Procedures:  NONE ordered at this time of appointment   Follow-Up: At [facility], you and your health needs are our priority.  As part of our continuing mission to provide you with exceptional heart care, we have created designated Provider Care Teams.  These Care Teams include your primary Cardiologist (physician) and Advanced Practice Providers (APPs -  Physician Assistants and Nurse Practitioners) who all work together to provide you with the care you need, when you need it. You will need a follow up appointment in 2 months.  Please call our office 2 months in advance to schedule this appointment.  You may see Sanda Klein, MD or one of the following Advanced Practice Providers on your designated Care Team: [place], [place] . Fabian Sharp, PA-C  Any Other Special Instructions Will Be Listed Below (If Applicable).

## 2019-02-05 ENCOUNTER — Telehealth: Payer: Self-pay

## 2019-02-05 NOTE — Telephone Encounter (Signed)
lmom for prescreen  

## 2019-02-06 ENCOUNTER — Ambulatory Visit (INDEPENDENT_AMBULATORY_CARE_PROVIDER_SITE_OTHER): Payer: Medicare Other | Admitting: *Deleted

## 2019-02-06 ENCOUNTER — Other Ambulatory Visit: Payer: Self-pay

## 2019-02-06 DIAGNOSIS — I253 Aneurysm of heart: Secondary | ICD-10-CM

## 2019-02-06 DIAGNOSIS — Z7901 Long term (current) use of anticoagulants: Secondary | ICD-10-CM

## 2019-02-06 LAB — POCT INR: INR: 2 (ref 2.0–3.0)

## 2019-02-06 NOTE — Patient Instructions (Signed)
Description   Spoke with pt and instructed pt to continue taking 1 tablet daily except 1.5 tablets each Sunday, Wednesday, and Fridays.  Repeat INR in 2 weeks.

## 2019-02-09 ENCOUNTER — Ambulatory Visit (INDEPENDENT_AMBULATORY_CARE_PROVIDER_SITE_OTHER): Payer: Medicare Other

## 2019-02-09 ENCOUNTER — Telehealth: Payer: Self-pay | Admitting: Internal Medicine

## 2019-02-09 ENCOUNTER — Encounter: Payer: Self-pay | Admitting: Internal Medicine

## 2019-02-09 ENCOUNTER — Ambulatory Visit: Payer: Medicare Other | Admitting: Internal Medicine

## 2019-02-09 ENCOUNTER — Other Ambulatory Visit: Payer: Self-pay

## 2019-02-09 VITALS — BP 122/72 | HR 83 | Temp 97.9°F | Ht 67.0 in | Wt 205.4 lb

## 2019-02-09 DIAGNOSIS — R918 Other nonspecific abnormal finding of lung field: Secondary | ICD-10-CM | POA: Diagnosis not present

## 2019-02-09 DIAGNOSIS — R05 Cough: Secondary | ICD-10-CM | POA: Diagnosis not present

## 2019-02-09 DIAGNOSIS — R058 Other specified cough: Secondary | ICD-10-CM

## 2019-02-09 NOTE — Telephone Encounter (Signed)
Spoke with pt and she was concerned if infection is back?  I know that Dr. Melvyn Novas has not resulted the xray yet and instructed her we would call back when it was resulted.  Dr Melvyn Novas please advise?

## 2019-02-09 NOTE — Patient Instructions (Signed)
Please remember to go to the  x-ray department  for your tests - we will call you with the results when they are available     Please schedule a follow up office visit in 4 weeks, sooner if needed

## 2019-02-09 NOTE — Telephone Encounter (Signed)
See result note, I explained to her that complete lung healing takes up to 6 weeks even in best of circumstances, and her cxr is improved so f/u with final study not due for a month unless new symptoms erupt in meantime.

## 2019-02-09 NOTE — Progress Notes (Signed)
Spoke with pt and notified of results per Dr. Wert. Pt verbalized understanding and denied any questions. 

## 2019-02-09 NOTE — Telephone Encounter (Signed)
Spoke with the pt and notified of response per MW and she verbalized understanding  Nothing further needed

## 2019-02-09 NOTE — Progress Notes (Signed)
Desiree Harrison, female    DOB: 05/19/1946,   MRN: [id number]   Brief patient profile:  4 yobf never regular smoker with no respiratory problems until her mid 11s with chronic rhinitis assoc itchy/ sneezing/ eyes [place]  referred to [facility] allergy > pos grass rec otc antihistamines only/ no inhalers >>  Zyrtec  seemed to work and maintained on it since then dx 2017 chf rx with lisinopril caused a cough but 100% resolved off it  indolent onset cough while on losartan in Nov 2019 while in [place] > UC at [facility] dx bilateral lower lobe pna per report rx levaquin /inhaler > much better but still cough so cxr done 08/27/18 ? Bilateral perihilar pna >   rx augmentin and then doxycline/amoxicillin with no improvement in cough abd cxr showing ? RUL as dz  So referred to pulmonary clinic 12/30/2018 by Dr   Kenton Kingfisher     History of Present Illness  12/30/2018  Pulmonary/ 1st office eval/Wert  Chief Complaint  Patient presents with  . Pulmonary Consult    referred by Dr. Kenton Kingfisher (PCP) for recurrent pneumonia sinc Nov 2019; having nonproductive cough w/ light green mucus, SOB when outside, mild wheezing, chest tightness when laying down at night, denies fever/chills/sweats  Dyspnea:  MMRC3 = can't walk 100 yards even at a slow pace at a flat grade s stopping due to sob   Cough: first thing in am / min discolored  Sleep: worse wheezing immediately at hs . 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, alv proteinosis, asp, bronchiectais, BOOP  > ESR not impressive but cover for now just with   prednisone x 6 days then regroup in 2 weeks with cxr ARDS/ AIP Occupational dz/ HSP Neoplasm Infection seems unlikely though still coughing up slt green mucus so rex levaquin x 10 days to cover bronchiectasis flare also  Drug None of the usual suspects listed  Pulmonary emboli, Protein  disorders Edema> no orthopnea/ pnd or LE edema/ no s3 > should do bnp or return if still sob  Eosinophilic dz> peripheral eos not impressively elevated Sarcoidosis Connective tissue dz> no hx to suggest Hist X / Hemorrhage Idiopathic   REC Prednisone 10 mg take  4 each am x 2 days,   2 each am x 2 days,  1 each am x 2 days and stop  Levaquin 500 mg daily x 10 days Prilosec 20 mg otc Take 30- 60 min before your first and last meals of the day  GERD diet    01/12/2019  f/u ov/Wert re: cough since nov 2019 / some better  Chief Complaint  Patient presents with  . Follow-up    Breathing is some better and she is coughing less. Cough is now non prod. She is using her albuterol inhaler about once per wk on average.   Dyspnea better  MMRC2 = can't walk a nl pace on a flat grade s sob but does fine slow and flat  Cough: p stirs , does not wake her up/ non productive - swallowing is better, no cough with meals Sleeping: one pillow / flat bed  SABA use: once p outside 02: none  rec Continue omeprazole Take 30- 60 min before your first and last meals of the day  Resume zyrtec 10 mg one daily as needed for itchy/ sneezing / drainage/ runny nose  FOB 01/20/2019 nl x purulent secretions RUL > grew MRSA > rx doxy x1 weeks    02/09/2019  f/u ov/Wert re: says last doxycycline was one week prior to [place] Complaint  Patient presents with  . Follow-up    Breathing is some better. She is still coughing some, non prod. She is using her albuterol inhaler 3-4 x per wk.   Dyspnea:  MMRC1 = can walk nl pace, flat grade, can't hurry or go uphills or steps s sob   Cough:  Late pm  Sleeping: flat bed/ 1-2 pillow SABA use: with activity 02: none    No obvious day to day or daytime variability or assoc excess/ purulent sputum or mucus plugs or hemoptysis or cp or chest tightness, subjective wheeze or overt sinus or hb symptoms.   Sleeping  without nocturnal  or early am exacerbation  of respiratory   c/o's or need for noct saba. Also denies any obvious fluctuation of symptoms with weather or environmental changes or other aggravating or alleviating factors except as outlined above   No unusual exposure hx or h/o childhood pna/ asthma or knowledge of premature birth.  Current Allergies, Complete Past Medical History, Past Surgical History, Family History, and Social History were reviewed in [employer] record.  ROS  The following are not active complaints unless bolded Hoarseness, sore throat, dysphagia, dental problems, itching, sneezing,  nasal congestion or discharge of excess mucus or purulent secretions, ear ache,   fever, chills, sweats, unintended wt loss or wt gain, classically pleuritic or exertional cp,  orthopnea pnd or arm/hand swelling  or leg swelling, presyncope, palpitations, abdominal pain, anorexia, nausea, vomiting, diarrhea  or change in bowel habits or change in bladder habits, change in stools or change in urine, dysuria, hematuria,  rash, arthralgias, visual complaints, headache, numbness, weakness or ataxia or problems with walking or coordination,  change in mood or  memory.        Current Meds  Medication Sig  . albuterol (PROVENTIL HFA;VENTOLIN HFA) 108 (90 Base) MCG/ACT inhaler Inhale 1-2 puffs into the lungs every 6 (six) hours as needed for wheezing or shortness of breath.  Marland Kitchen amLODipine (NORVASC) 5 MG tablet Take 1 tablet (5 mg total) by mouth daily.  Marland Kitchen aspirin EC 81 MG tablet Take 81 mg by mouth daily.  Marland Kitchen atorvastatin (LIPITOR) 80 MG tablet Take 1 tablet (80 mg total) by mouth daily.  . benzonatate (TESSALON) 100 MG capsule Take 1-2 capsules (100-200 mg total) by mouth 3 (three) times daily as needed for cough.  . carvedilol (COREG) 25 MG tablet Take 25 mg by mouth 2 (two) times daily with a meal.   . cetirizine (ZYRTEC) 10 MG tablet Take 10 mg by mouth at bedtime.  . gabapentin (NEURONTIN) 100 MG capsule TAKE 2 CAPSULES BY MOUTH 2 TIMES  DAILY (Patient taking differently: Take 200 mg by mouth 2 (two) times daily. )  . HYDROcodone-homatropine (HYCODAN) 5-1.5 MG/5ML syrup Take 5 mLs by mouth every 6 (six) hours as needed for cough.  . insulin lispro (HUMALOG) 100 UNIT/ML injection Inject 0.1 mLs (10 Units total) into the skin 3 (three) times daily before meals.  . insulin lispro protamine-lispro (HUMALOG 50/50 MIX) (50-50) 100 UNIT/ML SUSP injection Inject 0.3 mLs (30 Units total) into the skin 2 (two) times daily before a meal.  . Insulin Pen Needle (NOVOFINE) 30G X 8 MM MISC Inject 10 each into the skin as needed.  Marland Kitchen levothyroxine (SYNTHROID, LEVOTHROID)  50 MCG tablet Take 1 tablet (50 mcg total) by mouth daily.  Marland Kitchen omeprazole (PRILOSEC) 20 MG capsule Take 30- 60 min before your first and last meals of the day  . warfarin (COUMADIN) 3 MG tablet TAKE 1 TABLET BY MOUTH EVERY DAY AT 6 PM (Patient taking differently: Take 3-4.5 mg by mouth daily. Wed, Fri and Sun take 4.5 mg all other days take 3 mg)               Objective:     amb mod obese bf nad freq throat clearing     02/09/2019         205   01/12/19 213 lb 9.6 oz (96.9 kg)  12/30/18 208 lb 9.6 oz (94.6 kg)  11/24/18 210 lb (95.3 kg)     Vital signs reviewed - Note on arrival 02 sats  99% on RA        HEENT: Upper and lower partial dentures/ nl turbinates bilaterally, and oropharynx. Nl external ear canals without cough reflex   NECK :  without JVD/Nodes/TM/ nl carotid upstrokes bilaterally   LUNGS: no acc muscle use,  Nl contour chest which is clear to A and P bilaterally without cough on insp or exp maneuvers   CV:  RRR  no s3 or murmur or increase in P2, and no edema   ABD:  Obese/ soft and nontender with nl inspiratory excursion in the supine position. No bruits or organomegaly appreciated, bowel sounds nl  MS:  Nl gait/ ext warm without deformities, calf tenderness, cyanosis or clubbing No obvious joint restrictions   SKIN: warm and dry without  lesions    NEURO:  alert, approp, nl sensorium with  no motor or cerebellar deficits apparent.      CXR PA and Lateral:   02/09/2019 :    I personally reviewed images and agree with radiology impression as follows:   Mild bronchitic changes. Interval improvement in RIGHT upper lobe infiltrate. My impression: improvement is 90%       Assessment

## 2019-02-10 LAB — CULTURE, FUNGUS WITHOUT SMEAR

## 2019-02-12 ENCOUNTER — Ambulatory Visit (HOSPITAL_COMMUNITY): Payer: Medicare Other

## 2019-02-12 ENCOUNTER — Telehealth: Payer: Self-pay

## 2019-02-12 NOTE — Telephone Encounter (Signed)
lmom for prescreen  

## 2019-02-16 ENCOUNTER — Encounter: Payer: Self-pay | Admitting: Internal Medicine

## 2019-02-16 NOTE — Assessment & Plan Note (Addendum)
Onset of cough Nov 2019 - ESR 12/30/2018 33 - Gold quant TB 12/30/2018 neg  - 01/12/2019 cxr c/w persistent RUL as dz  With ESR up to 51 and nl bnp   - FOB 01/20/2019 nl x purulent secretions RUL > grew MRSA > rx doxy   - f/u cxr 02/09/2019 90% improved > hold further abx and f/u in 4 weeks for final cxr

## 2019-02-16 NOTE — Assessment & Plan Note (Signed)
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   These are the same pts (now being labeled as having "irritable larynx syndrome" by some cough centers) who not infrequently have a history of having failed to tolerate ace inhibitors (which is the case here)   dry powder inhalers or biphosphonates or report having atypical/extraesophageal reflux symptoms that don't respond to standard doses of PPI  and are easily confused as having aecopd or asthma flares by even experienced allergists/ pulmonologists (myself included).   >>> rec continue gabapentin / ppi and f/u in 4 weeks with all meds in hand using a trust but verify approach to confirm accurate Medication  Reconciliation The principal here is that until we are certain that the  patients are doing what we've asked, it makes no sense to ask them to do more.    I had an extended discussion with the patient reviewing all relevant studies completed to date and  lasting 15 to 20 minutes of a 25 minute visit    Each maintenance medication was reviewed in detail including most importantly the difference between maintenance and prns and under what circumstances the prns are to be triggered using an action plan format that is not reflected in the computer generated alphabetically organized AVS.     Please see AVS for specific instructions unique to this visit  that I personally wrote and verbalized to the the pt in detail and then reviewed with pt  by my nurse highlighting any  changes in therapy recommended at today's visit to their plan of care.

## 2019-02-19 ENCOUNTER — Ambulatory Visit (INDEPENDENT_AMBULATORY_CARE_PROVIDER_SITE_OTHER): Payer: Medicare Other | Admitting: *Deleted

## 2019-02-19 DIAGNOSIS — I5022 Chronic systolic (congestive) heart failure: Secondary | ICD-10-CM | POA: Diagnosis not present

## 2019-02-19 LAB — CUP PACEART REMOTE DEVICE CHECK
Battery Remaining Longevity: 144 mo
Battery Remaining Percentage: 100 %
Brady Statistic RV Percent Paced: 0 %
Date Time Interrogation Session: 20200611061100
HighPow Impedance: 69 Ohm
Implantable Pulse Generator Implant Date: 20170217
Lead Channel Impedance Value: 854 Ohm
Lead Channel Setting Pacing Amplitude: 2 V
Lead Channel Setting Pacing Pulse Width: 0.4 ms
Lead Channel Setting Sensing Sensitivity: 0.5 mV
Pulse Gen Serial Number: 213123

## 2019-02-19 NOTE — Telephone Encounter (Signed)

## 2019-02-20 ENCOUNTER — Ambulatory Visit (INDEPENDENT_AMBULATORY_CARE_PROVIDER_SITE_OTHER): Payer: Medicare Other | Admitting: *Deleted

## 2019-02-20 ENCOUNTER — Other Ambulatory Visit: Payer: Self-pay

## 2019-02-20 DIAGNOSIS — Z7901 Long term (current) use of anticoagulants: Secondary | ICD-10-CM

## 2019-02-20 DIAGNOSIS — I253 Aneurysm of heart: Secondary | ICD-10-CM

## 2019-02-20 LAB — POCT INR: INR: 2.1 (ref 2.0–3.0)

## 2019-02-20 NOTE — Patient Instructions (Signed)
Description   Change your dose to 1.5 tablets everyday except 1 tablet on Tuesdays, Thursdays and Saturdays.   Repeat INR in 2 weeks.

## 2019-02-21 ENCOUNTER — Other Ambulatory Visit: Payer: Self-pay | Admitting: Family Medicine

## 2019-02-21 ENCOUNTER — Other Ambulatory Visit: Payer: Self-pay | Admitting: Cardiovascular Disease

## 2019-02-23 NOTE — Telephone Encounter (Signed)
Please advise 

## 2019-02-24 ENCOUNTER — Encounter: Payer: Self-pay | Admitting: Cardiology

## 2019-02-24 ENCOUNTER — Ambulatory Visit: Payer: Medicare Other

## 2019-02-24 NOTE — Progress Notes (Signed)
Remote ICD transmission.   

## 2019-03-02 ENCOUNTER — Telehealth: Payer: Self-pay

## 2019-03-02 NOTE — Telephone Encounter (Signed)
lmom for prescreen  

## 2019-03-04 LAB — ACID FAST CULTURE WITH REFLEXED SENSITIVITIES (MYCOBACTERIA): Acid Fast Culture: NEGATIVE

## 2019-03-09 ENCOUNTER — Other Ambulatory Visit: Payer: Self-pay

## 2019-03-09 ENCOUNTER — Ambulatory Visit (INDEPENDENT_AMBULATORY_CARE_PROVIDER_SITE_OTHER): Payer: Medicare Other | Admitting: *Deleted

## 2019-03-09 DIAGNOSIS — I253 Aneurysm of heart: Secondary | ICD-10-CM

## 2019-03-09 DIAGNOSIS — Z7901 Long term (current) use of anticoagulants: Secondary | ICD-10-CM

## 2019-03-09 LAB — POCT INR: INR: 2.6 (ref 2.0–3.0)

## 2019-03-09 NOTE — Patient Instructions (Signed)
Description   Continue taking  1.5 tablets everyday except 1 tablet on Tuesdays, Thursdays and Saturdays.   Repeat INR in 3 weeks.

## 2019-03-11 ENCOUNTER — Other Ambulatory Visit: Payer: Self-pay

## 2019-03-11 ENCOUNTER — Ambulatory Visit: Payer: Medicare Other | Admitting: Internal Medicine

## 2019-03-11 ENCOUNTER — Ambulatory Visit (INDEPENDENT_AMBULATORY_CARE_PROVIDER_SITE_OTHER): Payer: Medicare Other | Admitting: Family Medicine

## 2019-03-11 ENCOUNTER — Encounter: Payer: Self-pay | Admitting: Family Medicine

## 2019-03-11 VITALS — BP 143/83 | HR 66 | Temp 97.5°F | Resp 17 | Ht 67.0 in | Wt 211.4 lb

## 2019-03-11 DIAGNOSIS — R918 Other nonspecific abnormal finding of lung field: Secondary | ICD-10-CM | POA: Diagnosis not present

## 2019-03-11 DIAGNOSIS — E785 Hyperlipidemia, unspecified: Secondary | ICD-10-CM | POA: Diagnosis not present

## 2019-03-11 DIAGNOSIS — Z1231 Encounter for screening mammogram for malignant neoplasm of breast: Secondary | ICD-10-CM

## 2019-03-11 DIAGNOSIS — E1159 Type 2 diabetes mellitus with other circulatory complications: Secondary | ICD-10-CM | POA: Diagnosis not present

## 2019-03-11 DIAGNOSIS — Z794 Long term (current) use of insulin: Secondary | ICD-10-CM

## 2019-03-11 DIAGNOSIS — E039 Hypothyroidism, unspecified: Secondary | ICD-10-CM

## 2019-03-11 DIAGNOSIS — I10 Essential (primary) hypertension: Secondary | ICD-10-CM

## 2019-03-11 NOTE — Addendum Note (Signed)
Addended by: [name] Perches on: 03/11/2019 10:52 AM   Modules accepted: Orders

## 2019-03-11 NOTE — Progress Notes (Deleted)
110-120 fasting numbers She checks her readings another She follow-up with pulmonary  Blood Pressure 120's /  Starting walking daily ,walking about mile

## 2019-03-11 NOTE — Progress Notes (Signed)
Patient ID: Desiree Harrison, female    DOB: 1946/05/31, 73 y.o.   MRN: [id number]  PCP: Scot Jun, FNP  Chief Complaint  Patient presents with  . Diabetes  . Hypertension  . Hyperlipidemia    Subjective:  HPI  Desiree Harrison is a 73 y.o. female presents for evaluation diabetes and hypertension follow-up.  [name] has Chronic combined systolic and diastolic heart failure ([place]); Diabetes mellitus ([place]); Essential hypertension; Ventricular aneurysm; Long term current use of anticoagulant therapy; Hypothyroidism; Hyperlipidemia; ICD (implantable cardioverter-defibrillator) in place; Long term (current) use of anticoagulants; Upper airway cough syndrome; Pulmonary infiltrates on CXR; DOE (dyspnea on exertion); CAP (community acquired pneumonia); Sepsis due to pneumonia ([place]); Pneumonia; and CAD in native artery on their problem list. /   Diabetes Last A1C 9.0. During her recently hospitalization, her diabetes regimen was changed.  Insulin was changed Humalog 50/50 mix. Blood sugars have averaged less than 150 and are mostly between 110-120 fasting. Denies any hypoglycemia. Complaint with medication.   Hypertension  Anu Stagner reports  home monitoring of blood pressure. Reports adherence to blood pressure medications. Blood pressure is mildly elevated today. Home readings less than 130/90. Patient denies use of tobacco however, it is noted in chart, that daughter reported back in May, patient was still smoking a few cigarettes here and their. Today, she reports that she stopped smoking completely years ago. She has recently began walking daily and is tolerating without SOB. Continue to try and reduce sodium rich foods. Denies any episodes of dizziness, headaches, shortness of breath, or chest pain.   Recurrent Pneumonia and Right Pulmonary Infiltrates  Desiree Harrison is followed by Pulmonology since April. Desiree Harrison has been hospitalized 3 times this year due to pneumonia, and sepsis related  to pneumonia . Reports feeling great. No coughing of shortness of breath. She has an abnormal Chest CT 01/17/19 which reveal a right pulmonary nodule, right pleural effusion, and mass-like consolidation which was consistent with pneumonia. She is scheduled to follow-up with Dr. Melvyn Novas, pulmonologist next week.  Social History   Socioeconomic History  . Marital status: Widowed    Spouse name: Not on file  . Number of children: Not on file  . Years of education: Not on file  . Highest education level: Not on file  Occupational History  . Not on file  Social Needs  . Financial resource strain: Not on file  . Food insecurity    Worry: Not on file    Inability: Not on file  . Transportation needs    Medical: Not on file    Non-medical: Not on file  Tobacco Use  . Smoking status: Former Smoker    Packs/day: 0.25    Types: Cigarettes    Quit date: 12/30/1963    Years since quitting: 55.2  . Smokeless tobacco: Never Used  . Tobacco comment: pt smoked 5-6/cigs daily 12/30/2018  Substance and Sexual Activity  . Alcohol use: Not Currently  . Drug use: Never  . Sexual activity: Not on file  Lifestyle  . Physical activity    Days per week: Not on file    Minutes per session: Not on file  . Stress: Not on file  Relationships  . Social Herbalist on phone: Not on file    Gets together: Not on file    Attends religious service: Not on file    Active member of club or organization: Not on file    Attends meetings of clubs or organizations: Not  on file    Relationship status: Not on file  . Intimate partner violence    Fear of current or ex partner: Not on file    Emotionally abused: Not on file    Physically abused: Not on file    Forced sexual activity: Not on file  Other Topics Concern  . Not on file  Social History Narrative  . Not on file    Family History  Problem Relation Age of Onset  . Breast cancer Mother   . Coronary artery disease Mother   . Diabetes Mother    . Stroke Father      Review of Systems Pertinent negatives listed in HPI Allergies  Allergen Reactions  . Lisinopril Cough    Prior to Admission medications   Medication Sig Start Date End Date Taking? Authorizing Provider  albuterol (PROVENTIL HFA;VENTOLIN HFA) 108 (90 Base) MCG/ACT inhaler Inhale 1-2 puffs into the lungs every 6 (six) hours as needed for wheezing or shortness of breath. 11/12/18  Yes Yu, Amy V, PA-C  amLODipine (NORVASC) 5 MG tablet Take 1 tablet (5 mg total) by mouth daily. 01/30/19  Yes Almyra Deforest, PA  aspirin EC 81 MG tablet Take 81 mg by mouth daily.   Yes [provider]  atorvastatin (LIPITOR) 80 MG tablet Take 1 tablet (80 mg total) by mouth daily. 01/30/19  Yes Almyra Deforest, PA  carvedilol (COREG) 25 MG tablet Take 25 mg by mouth 2 (two) times daily with a meal.  08/27/17  Yes [provider]  cetirizine (ZYRTEC) 10 MG tablet Take 10 mg by mouth at bedtime.   Yes [provider]  gabapentin (NEURONTIN) 100 MG capsule TAKE 2 CAPSULES BY MOUTH TWICE A DAY 02/23/19  Yes Scot Jun, FNP  HYDROcodone-homatropine (HYCODAN) 5-1.5 MG/5ML syrup Take 5 mLs by mouth every 6 (six) hours as needed for cough.   Yes [provider]  insulin lispro (HUMALOG) 100 UNIT/ML injection Inject 0.1 mLs (10 Units total) into the skin 3 (three) times daily before meals. 10/16/18  Yes Scot Jun, FNP  insulin lispro protamine-lispro (HUMALOG 50/50 MIX) (50-50) 100 UNIT/ML SUSP injection Inject 0.3 mLs (30 Units total) into the skin 2 (two) times daily before a meal. 01/21/19  Yes Kyle, Tyrone A, DO  Insulin Pen Needle (NOVOFINE) 30G X 8 MM MISC Inject 10 each into the skin as needed. 10/16/18  Yes Scot Jun, FNP  levothyroxine (SYNTHROID, LEVOTHROID) 50 MCG tablet Take 1 tablet (50 mcg total) by mouth daily. 10/03/18  Yes Scot Jun, FNP  omeprazole (PRILOSEC) 20 MG capsule Take 30- 60 min before your first and last meals of the day  12/30/18  Yes Tanda Rockers, MD  warfarin (COUMADIN) 3 MG tablet TAKE 1 TABLET BY MOUTH EVERY DAY AT 6 PM 02/23/19  Yes Croitoru, Mihai, MD    Past Medical, Surgical Family and Social History reviewed and updated.    Objective:   Today's Vitals   03/11/19 1010  BP: (!) 143/83  Pulse: 66  Resp: 17  Temp: (!) 97.5 F (36.4 C)  TempSrc: Temporal  SpO2: 96%  Weight: 211 lb 6.4 oz (95.9 kg)  Height: 5\' 7"  (1.702 m)    BP Readings from Last 3 Encounters:  03/11/19 (!) 143/83  02/09/19 122/72  01/30/19 (!) 160/100    Filed Weights   03/11/19 1010  Weight: 211 lb 6.4 oz (95.9 kg)       Physical Exam General appearance: alert, well  developed, well nourished, cooperative and in no distress Head: Normocephalic, without obvious abnormality, atraumatic Respiratory: Respirations even and unlabored, normal respiratory rate Heart: rate and rhythm normal. No gallop or murmurs noted on exam  Abdomen: BS +, no distention, no rebound tenderness, or no mass Extremities: No gross deformities Skin: Skin color, texture, turgor normal. No rashes seen  Psych: Appropriate mood and affect. Neurologic: Mental status: Alert, oriented to person, place, and time, thought content appropriate.  Lab Results  Component Value Date   POCGLU 118 (A) 11/24/2018   POCGLU 134 (A) 10/16/2018   POCGLU 215 (A) 08/19/2018    Lab Results  Component Value Date   HGBA1C 9.0 (H) 11/24/2018     Assessment & Plan:  1. Type 2 diabetes mellitus with other circulatory complication, with long-term current use of insulin ([place]), last A1C not at goal, 9.0. repeating A1C today. Home readings are reassuring. Will make adjustments as warranted.   2. Essential hypertension, elevated today We have discussed target BP range and blood pressure goal. I have advised patient to check BP regularly and to call us back or report to clinic if the numbers are consistently higher than 140/90. We discussed the importance of  compliance with medical therapy and DASH diet recommended, consequences of uncontrolled hypertension discussed.  - Continue current BP medications - Comprehensive metabolic panel  3. Hypothyroidism, unspecified type -Continue levothyroxine 50 mcg - Thyroid Panel With TSH  4. Encounter for screening mammogram for breast cancer - MM 3D SCREEN BREAST BILATERAL; Future  5. Hyperlipidemia, unspecified hyperlipidemia type -Continue Lipitor 80 mg once daily  - Lipid panel  6. Pulmonary infiltrates on CXR -Follow-up with pulmonologist as scheduled. -Asymptomatic of respiratory symptoms  RTC: 3 months for chronic condition follow-up    Molli Barrows, FNP Primary Care at [facility] [address] [place] [phone]: [phone]

## 2019-03-11 NOTE — Patient Instructions (Signed)
No changes in medications today. To schedule your colonoscopy, call [facility] at [phone]. To schedule your breast exam, please call the [facility] Phone: [phone]    Carbohydrate Counting for Diabetes Mellitus, Adult  Carbohydrate counting is a method of keeping track of how many carbohydrates you eat. Eating carbohydrates naturally increases the amount of sugar (glucose) in the blood. Counting how many carbohydrates you eat helps keep your blood glucose within normal limits, which helps you manage your diabetes (diabetes mellitus). It is important to know how many carbohydrates you can safely have in each meal. This is different for every person. A diet and nutrition specialist (registered dietitian) can help you make a meal plan and calculate how many carbohydrates you should have at each meal and snack. Carbohydrates are found in the following foods:  Grains, such as breads and cereals.  Dried beans and soy products.  Starchy vegetables, such as potatoes, peas, and corn.  Fruit and fruit juices.  Milk and yogurt.  Sweets and snack foods, such as cake, cookies, candy, chips, and soft drinks. How do I count carbohydrates? There are two ways to count carbohydrates in food. You can use either of the methods or a combination of both. Reading "Nutrition Facts" on packaged food The "Nutrition Facts" list is included on the labels of almost all packaged foods and beverages in the [place] It includes:  The serving size.  Information about nutrients in each serving, including the grams (g) of carbohydrate per serving. To use the "Nutrition Facts":  Decide how many servings you will have.  Multiply the number of servings by the number of carbohydrates per serving.  The resulting number is the total amount of carbohydrates that you will be having. Learning standard serving sizes of other foods When you eat carbohydrate foods that are not packaged or do not  include "Nutrition Facts" on the label, you need to measure the servings in order to count the amount of carbohydrates:  Measure the foods that you will eat with a food scale or measuring cup, if needed.  Decide how many standard-size servings you will eat.  Multiply the number of servings by 15. Most carbohydrate-rich foods have about 15 g of carbohydrates per serving. ? For example, if you eat 8 oz (170 g) of strawberries, you will have eaten 2 servings and 30 g of carbohydrates (2 servings x 15 g = 30 g).  For foods that have more than one food mixed, such as soups and casseroles, you must count the carbohydrates in each food that is included. The following list contains standard serving sizes of common carbohydrate-rich foods. Each of these servings has about 15 g of carbohydrates:   hamburger bun or  English muffin.   oz (15 mL) syrup.   oz (14 g) jelly.  1 slice of bread.  1 six-inch tortilla.  3 oz (85 g) cooked rice or pasta.  4 oz (113 g) cooked dried beans.  4 oz (113 g) starchy vegetable, such as peas, corn, or potatoes.  4 oz (113 g) hot cereal.  4 oz (113 g) mashed potatoes or  of a large baked potato.  4 oz (113 g) canned or frozen fruit.  4 oz (120 mL) fruit juice.  4-6 crackers.  6 chicken nuggets.  6 oz (170 g) unsweetened dry cereal.  6 oz (170 g) plain fat-free yogurt or yogurt sweetened with artificial sweeteners.  8 oz (240 mL) milk.  8 oz (170 g) fresh fruit  or one small piece of fruit.  24 oz (680 g) popped popcorn. Example of carbohydrate counting Sample meal  3 oz (85 g) chicken breast.  6 oz (170 g) brown rice.  4 oz (113 g) corn.  8 oz (240 mL) milk.  8 oz (170 g) strawberries with sugar-free whipped topping. Carbohydrate calculation 1. Identify the foods that contain carbohydrates: ? Rice. ? Corn. ? Milk. ? Strawberries. 2. Calculate how many servings you have of each food: ? 2 servings rice. ? 1 serving corn. ? 1  serving milk. ? 1 serving strawberries. 3. Multiply each number of servings by 15 g: ? 2 servings rice x 15 g = 30 g. ? 1 serving corn x 15 g = 15 g. ? 1 serving milk x 15 g = 15 g. ? 1 serving strawberries x 15 g = 15 g. 4. Add together all of the amounts to find the total grams of carbohydrates eaten: ? 30 g + 15 g + 15 g + 15 g = 75 g of carbohydrates total. Summary  Carbohydrate counting is a method of keeping track of how many carbohydrates you eat.  Eating carbohydrates naturally increases the amount of sugar (glucose) in the blood.  Counting how many carbohydrates you eat helps keep your blood glucose within normal limits, which helps you manage your diabetes.  A diet and nutrition specialist (registered dietitian) can help you make a meal plan and calculate how many carbohydrates you should have at each meal and snack. This information is not intended to replace advice given to you by your health care provider. Make sure you discuss any questions you have with your health care provider. Document Released: 08/27/2005 Document Revised: 03/21/2017 Document Reviewed: 02/08/2016 Elsevier Patient Education  2020 [employer].

## 2019-03-12 LAB — COMPREHENSIVE METABOLIC PANEL
ALT: 17 IU/L (ref 0–32)
AST: 22 IU/L (ref 0–40)
Albumin/Globulin Ratio: 1 — ABNORMAL LOW (ref 1.2–2.2)
Albumin: 3.6 g/dL — ABNORMAL LOW (ref 3.7–4.7)
Alkaline Phosphatase: 75 IU/L (ref 39–117)
BUN/Creatinine Ratio: 16 (ref 12–28)
BUN: 17 mg/dL (ref 8–27)
Bilirubin Total: 0.3 mg/dL (ref 0.0–1.2)
CO2: 23 mmol/L (ref 20–29)
Calcium: 8.9 mg/dL (ref 8.7–10.3)
Chloride: 104 mmol/L (ref 96–106)
Creatinine, Ser: 1.06 mg/dL — ABNORMAL HIGH (ref 0.57–1.00)
GFR calc Af Amer: 61 mL/min/{1.73_m2} (ref >59)
GFR calc non Af Amer: 53 mL/min/{1.73_m2} — ABNORMAL LOW (ref >59)
Globulin, Total: 3.5 g/dL (ref 1.5–4.5)
Glucose: 95 mg/dL (ref 65–99)
Potassium: 4.5 mmol/L (ref 3.5–5.2)
Sodium: 142 mmol/L (ref 134–144)
Total Protein: 7.1 g/dL (ref 6.0–8.5)

## 2019-03-12 LAB — HEMOGLOBIN A1C
Est. average glucose Bld gHb Est-mCnc: 174 mg/dL
Hgb A1c MFr Bld: 7.7 % — ABNORMAL HIGH (ref 4.8–5.6)

## 2019-03-12 LAB — THYROID PANEL WITH TSH
Free Thyroxine Index: 2 (ref 1.2–4.9)
T3 Uptake Ratio: 26 % (ref 24–39)
T4, Total: 7.6 ug/dL (ref 4.5–12.0)
TSH: 4.21 u[IU]/mL (ref 0.450–4.500)

## 2019-03-12 LAB — LIPID PANEL
Chol/HDL Ratio: 3.5 ratio (ref 0.0–4.4)
Cholesterol, Total: 129 mg/dL (ref 100–199)
HDL: 37 mg/dL — ABNORMAL LOW (ref >39)
LDL Calculated: 67 mg/dL (ref 0–99)
Triglycerides: 124 mg/dL (ref 0–149)
VLDL Cholesterol Cal: 25 mg/dL (ref 5–40)

## 2019-03-18 ENCOUNTER — Ambulatory Visit (INDEPENDENT_AMBULATORY_CARE_PROVIDER_SITE_OTHER): Payer: Medicare Other

## 2019-03-18 ENCOUNTER — Encounter: Payer: Self-pay | Admitting: Internal Medicine

## 2019-03-18 ENCOUNTER — Other Ambulatory Visit: Payer: Self-pay

## 2019-03-18 ENCOUNTER — Ambulatory Visit: Payer: Medicare Other | Admitting: Internal Medicine

## 2019-03-18 DIAGNOSIS — R918 Other nonspecific abnormal finding of lung field: Secondary | ICD-10-CM | POA: Diagnosis not present

## 2019-03-18 NOTE — Progress Notes (Signed)
Desiree Harrison, female    DOB: June 29, 1946,   MRN: [id number]   Brief patient profile:  24 yobf never regular smoker with no respiratory problems until her mid 89s with chronic rhinitis assoc itchy/ sneezing/ eyes [place]  referred to [facility] allergy > pos grass rec otc antihistamines only/ no inhalers >>  Zyrtec  seemed to work and maintained on it since then dx 2017 chf rx with lisinopril caused a cough but 100% resolved off it  indolent onset cough while on losartan in Nov 2019 while in [place] > UC at [facility] dx bilateral lower lobe pna per report rx levaquin /inhaler > much better but still cough so cxr done 08/27/18 ? Bilateral perihilar pna >   rx augmentin and then doxycline/amoxicillin with no improvement in cough abd cxr showing ? RUL as dz  So referred to pulmonary clinic 12/30/2018 by Dr   Kenton Kingfisher     History of Present Illness  12/30/2018  Pulmonary/ 1st office eval/[name]  Chief Complaint  Patient presents with  . Pulmonary Consult    referred by Dr. Kenton Kingfisher (PCP) for recurrent pneumonia sinc Nov 2019; having nonproductive cough w/ light green mucus, SOB when outside, mild wheezing, chest tightness when laying down at night, denies fever/chills/sweats  Dyspnea:  MMRC3 = can't walk 100 yards even at a slow pace at a flat grade s stopping due to sob   Cough: first thing in am / min discolored  Sleep: worse wheezing immediately at hs . 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, alv proteinosis, asp, bronchiectais, BOOP  > ESR not impressive but cover for now just with   prednisone x 6 days then regroup in 2 weeks with cxr ARDS/ AIP Occupational dz/ HSP Neoplasm Infection seems unlikely though still coughing up slt green mucus so rex levaquin x 10 days to cover bronchiectasis flare also  Drug None of the usual suspects listed  Pulmonary emboli, Protein  disorders Edema> no orthopnea/ pnd or LE edema/ no s3 > should do bnp or return if still sob  Eosinophilic dz> peripheral eos not impressively elevated Sarcoidosis Connective tissue dz> no hx to suggest Hist X / Hemorrhage Idiopathic   REC Prednisone 10 mg take  4 each am x 2 days,   2 each am x 2 days,  1 each am x 2 days and stop  Levaquin 500 mg daily x 10 days Prilosec 20 mg otc Take 30- 60 min before your first and last meals of the day  GERD diet    01/12/2019  f/u ov/[name] re: cough since nov 2019 / some better  Chief Complaint  Patient presents with  . Follow-up    Breathing is some better and she is coughing less. Cough is now non prod. She is using her albuterol inhaler about once per wk on average.   Dyspnea better  MMRC2 = can't walk a nl pace on a flat grade s sob but does fine slow and flat  Cough: p stirs , does not wake her up/ non productive - swallowing is better, no cough with meals Sleeping: one pillow / flat bed  SABA use: once p outside 02: none  rec Continue omeprazole Take 30- 60 min before your first and last meals of the day  Resume zyrtec 10 mg one daily as needed for itchy/ sneezing / drainage/ runny nose  FOB 01/20/2019 nl x purulent secretions RUL >grew MRSA > rx doxy      02/09/2019  f/u ov/[name] re: says last doxycycline was one week prior to [name] Complaint  Patient presents with  . Follow-up    Breathing is some better. She is still coughing some, non prod. She is using her albuterol inhaler 3-4 x per wk.   Dyspnea:  MMRC1 = can walk nl pace, flat grade, can't hurry or go uphills or steps s sob   Cough:  Late pm  Sleeping: flat bed/ 1-2 pillow SABA use: with activity 02: none rec F/u cxr in 4 weeks   03/18/2019  f/u ov/[name] re: pna Chief Complaint  Patient presents with  . Follow-up    Breathing is much improved and no new co's. She has only had to use her albuterol once or twic since her last visit.   Dyspnea:  Not limited by  breathing from desired activities   Cough: min dry  Sleeping: sleeps flat/ one pillow SABA use: once or twice since last ov 02: none    No obvious day to day or daytime variability or assoc excess/ purulent sputum or mucus plugs or hemoptysis or cp or chest tightness, subjective wheeze or overt sinus or hb symptoms.   Sleeping as above without nocturnal  or early am exacerbation  of respiratory  c/o's or need for noct saba. Also denies any obvious fluctuation of symptoms with weather or environmental changes or other aggravating or alleviating factors except as outlined above   No unusual exposure hx or h/o childhood pna/ asthma or knowledge of premature birth.  Current Allergies, Complete Past Medical History, Past Surgical History, Family History, and Social History were reviewed in [employer] record.  ROS  The following are not active complaints unless bolded Hoarseness, sore throat, dysphagia, dental problems, itching, sneezing,  nasal congestion or discharge of excess mucus or purulent secretions, ear ache,   fever, chills, sweats, unintended wt loss or wt gain, classically pleuritic or exertional cp,  orthopnea pnd or arm/hand swelling  or leg swelling, presyncope, palpitations, abdominal pain, anorexia, nausea, vomiting, diarrhea  or change in bowel habits or change in bladder habits, change in stools or change in urine, dysuria, hematuria,  rash, arthralgias, visual complaints, headache, numbness, weakness or ataxia or problems with walking or coordination,  change in mood or  memory.        Current Meds  Medication Sig  . albuterol (PROVENTIL HFA;VENTOLIN HFA) 108 (90 Base) MCG/ACT inhaler Inhale 1-2 puffs into the lungs every 6 (six) hours as needed for wheezing or shortness of breath.  Marland Kitchen amLODipine (NORVASC) 5 MG tablet Take 1 tablet (5 mg total) by mouth daily.  Marland Kitchen aspirin EC 81 MG tablet Take 81 mg by mouth daily.  Marland Kitchen atorvastatin (LIPITOR) 80 MG tablet Take  1 tablet (80 mg total) by mouth daily.  . carvedilol (COREG) 25 MG tablet Take 25 mg by mouth 2 (two) times daily with a meal.   . cetirizine (ZYRTEC) 10 MG tablet Take 10 mg by mouth at bedtime.  . gabapentin (NEURONTIN) 100 MG capsule TAKE 2 CAPSULES BY MOUTH TWICE A DAY  . HYDROcodone-homatropine (HYCODAN) 5-1.5 MG/5ML syrup Take 5 mLs by mouth every 6 (six) hours as needed for cough.  . insulin lispro (HUMALOG) 100 UNIT/ML injection Inject 0.1 mLs (10 Units total) into the skin 3 (three) times daily before meals.  . insulin lispro protamine-lispro (HUMALOG 50/50 MIX) (  50-50) 100 UNIT/ML SUSP injection Inject 0.3 mLs (30 Units total) into the skin 2 (two) times daily before a meal.  . Insulin Pen Needle (NOVOFINE) 30G X 8 MM MISC Inject 10 each into the skin as needed.  Marland Kitchen levothyroxine (SYNTHROID, LEVOTHROID) 50 MCG tablet Take 1 tablet (50 mcg total) by mouth daily.  Marland Kitchen omeprazole (PRILOSEC) 20 MG capsule Take 30- 60 min before your first and last meals of the day  . warfarin (COUMADIN) 3 MG tablet TAKE 1 TABLET BY MOUTH EVERY DAY AT 6 PM                 Objective:     amb mod obese wf nad    03/18/2019         216  02/09/2019         205   01/12/19 213 lb 9.6 oz (96.9 kg)  12/30/18 208 lb 9.6 oz (94.6 kg)  11/24/18 210 lb (95.3 kg)     Vital signs reviewed - Note on arrival 02 sats  94% on RA     HEENT: Upper and lower partial dentures/ nl turbinates bilaterally, and oropharynx. Nl external ear canals without cough reflex   NECK :  without JVD/Nodes/TM/ nl carotid upstrokes bilaterally   LUNGS: no acc muscle use,  Nl contour chest which is clear to A and P bilaterally without cough on insp or exp maneuvers   CV:  RRR  no s3 or murmur or increase in P2, and no edema   ABD:  soft and nontender with nl inspiratory excursion in the supine position. No bruits or organomegaly appreciated, bowel sounds nl  MS:  Nl gait/ ext warm without deformities, calf tenderness, cyanosis  or clubbing No obvious joint restrictions   SKIN: warm and dry without lesions    NEURO:  alert, approp, nl sensorium with  no motor or cerebellar deficits apparent.          CXR PA and Lateral:   03/18/2019 :    I personally reviewed images and agree with radiology impression as follows:   No active cardiopulmonary disease. My review: complete resolution      Assessment

## 2019-03-18 NOTE — Patient Instructions (Signed)
Please remember to go to the  x-ray department  for your tests - we will call you with the results when they are available     Pulmonary follow up is as needed

## 2019-03-19 ENCOUNTER — Encounter: Payer: Self-pay | Admitting: Internal Medicine

## 2019-03-19 NOTE — Assessment & Plan Note (Signed)
Onset of cough Nov 2019 - ESR 12/30/2018 33 - Gold quant TB 12/30/2018 neg  - 01/12/2019 cxr c/w persistent RUL as dz  With ESR up to 51 and nl bnp   - FOB 01/20/2019 nl x purulent secretions RUL > grew MRSA > rx doxy  - f/u cxr 02/09/2019 90% improved > hold further abx and f/u in 4 weeks for final cxr consider sinus ct next if symptoms persist  - 03/18/2019  cxr  >  Completely resolved  No further pulmonary f/u needed - still might consider sinus ct if recurrent resp infections/ symptoms become problematic in future.

## 2019-03-26 ENCOUNTER — Telehealth: Payer: Self-pay

## 2019-03-26 NOTE — Telephone Encounter (Signed)

## 2019-03-30 ENCOUNTER — Ambulatory Visit (INDEPENDENT_AMBULATORY_CARE_PROVIDER_SITE_OTHER): Payer: Medicare Other | Admitting: *Deleted

## 2019-03-30 ENCOUNTER — Other Ambulatory Visit: Payer: Self-pay

## 2019-03-30 DIAGNOSIS — Z7901 Long term (current) use of anticoagulants: Secondary | ICD-10-CM | POA: Diagnosis not present

## 2019-03-30 DIAGNOSIS — I253 Aneurysm of heart: Secondary | ICD-10-CM | POA: Diagnosis not present

## 2019-03-30 LAB — POCT INR: INR: 2.6 (ref 2.0–3.0)

## 2019-03-30 NOTE — Patient Instructions (Signed)
Description   Continue taking  1.5 tablets everyday except 1 tablet on Tuesdays, Thursdays and Saturdays.   Repeat INR in 4 weeks.

## 2019-04-03 ENCOUNTER — Other Ambulatory Visit: Payer: Self-pay | Admitting: Family Medicine

## 2019-04-03 NOTE — Telephone Encounter (Signed)
Forwarding medication refill to PCP for review. 

## 2019-04-08 ENCOUNTER — Other Ambulatory Visit: Payer: Self-pay | Admitting: Family Medicine

## 2019-04-08 NOTE — Telephone Encounter (Signed)
Requested medications are due for refill today?  No  Requested medications are on the active medication list?  No  Last refill N/A  Future visit scheduled?  Yes   Notes to clinic- this was sent to the [facility] refill pool.    Requested Prescriptions  Pending Prescriptions Disp Refills   traMADol (ULTRAM) 50 MG tablet [Pharmacy Med Name: TRAMADOL HCL 50 MG TABLET] 30 tablet     Sig: TAKE 1 TABLET BY MOUTH EVERY DAY AS NEEDED FOR PAIN     There is no refill protocol information for this order

## 2019-04-09 ENCOUNTER — Other Ambulatory Visit: Payer: Self-pay | Admitting: Family Medicine

## 2019-04-17 ENCOUNTER — Other Ambulatory Visit: Payer: Self-pay

## 2019-04-17 MED ORDER — CARVEDILOL 25 MG PO TABS: 25.0000 mg | ORAL_TABLET | Freq: Two times a day (BID) | ORAL | 1 refills | Status: AC

## 2019-04-24 ENCOUNTER — Other Ambulatory Visit: Payer: Self-pay

## 2019-04-24 ENCOUNTER — Ambulatory Visit (INDEPENDENT_AMBULATORY_CARE_PROVIDER_SITE_OTHER): Payer: Medicare Other | Admitting: *Deleted

## 2019-04-24 DIAGNOSIS — Z7901 Long term (current) use of anticoagulants: Secondary | ICD-10-CM | POA: Diagnosis not present

## 2019-04-24 DIAGNOSIS — I253 Aneurysm of heart: Secondary | ICD-10-CM | POA: Diagnosis not present

## 2019-04-24 LAB — POCT INR: INR: 1.7 — AB (ref 2.0–3.0)

## 2019-04-24 NOTE — Patient Instructions (Signed)
Description   Today take 2 tablets, then continue taking  1.5 tablets everyday except 1 tablet on Tuesdays, Thursdays and Saturdays.   Repeat INR in 3-4 weeks.

## 2019-04-25 ENCOUNTER — Other Ambulatory Visit: Payer: Self-pay | Admitting: Cardiovascular Disease

## 2019-05-15 ENCOUNTER — Ambulatory Visit (INDEPENDENT_AMBULATORY_CARE_PROVIDER_SITE_OTHER): Payer: Medicare Other | Admitting: Pharmacist Clinician (PhC)/ Clinical Pharmacy Specialist

## 2019-05-15 ENCOUNTER — Other Ambulatory Visit: Payer: Self-pay

## 2019-05-15 DIAGNOSIS — Z7901 Long term (current) use of anticoagulants: Secondary | ICD-10-CM | POA: Diagnosis not present

## 2019-05-15 DIAGNOSIS — I253 Aneurysm of heart: Secondary | ICD-10-CM

## 2019-05-15 LAB — POCT INR: INR: 2.9 (ref 2.0–3.0)

## 2019-05-21 ENCOUNTER — Ambulatory Visit (INDEPENDENT_AMBULATORY_CARE_PROVIDER_SITE_OTHER): Payer: Medicare Other | Admitting: *Deleted

## 2019-05-21 DIAGNOSIS — I5042 Chronic combined systolic (congestive) and diastolic (congestive) heart failure: Secondary | ICD-10-CM | POA: Diagnosis not present

## 2019-05-21 LAB — CUP PACEART REMOTE DEVICE CHECK
Battery Remaining Longevity: 144 mo
Battery Remaining Percentage: 100 %
Brady Statistic RV Percent Paced: 0 %
Date Time Interrogation Session: 20200910093353
HighPow Impedance: 78 Ohm
Implantable Pulse Generator Implant Date: 20170217
Lead Channel Impedance Value: 931 Ohm
Lead Channel Setting Pacing Amplitude: 2 V
Lead Channel Setting Pacing Pulse Width: 0.4 ms
Lead Channel Setting Sensing Sensitivity: 0.5 mV
Pulse Gen Serial Number: 213123

## 2019-05-25 ENCOUNTER — Other Ambulatory Visit: Payer: Self-pay | Admitting: Primary Care

## 2019-05-29 NOTE — Progress Notes (Signed)
Remote ICD transmission.   

## 2019-06-17 ENCOUNTER — Ambulatory Visit: Payer: Medicare Other

## 2019-06-26 ENCOUNTER — Other Ambulatory Visit: Payer: Self-pay

## 2019-06-26 ENCOUNTER — Ambulatory Visit (INDEPENDENT_AMBULATORY_CARE_PROVIDER_SITE_OTHER): Payer: Medicare Other | Admitting: Pharmacist

## 2019-06-26 DIAGNOSIS — Z7901 Long term (current) use of anticoagulants: Secondary | ICD-10-CM | POA: Diagnosis not present

## 2019-06-26 DIAGNOSIS — I253 Aneurysm of heart: Secondary | ICD-10-CM

## 2019-06-26 LAB — POCT INR: INR: 3.6 — AB (ref 2.0–3.0)

## 2019-07-15 ENCOUNTER — Other Ambulatory Visit: Payer: Self-pay | Admitting: Family Medicine

## 2019-07-15 NOTE — Telephone Encounter (Signed)
1) Medication(s) Requested (by name): gabapentin (NEURONTIN) 100 MG capsule [id number]   2) Pharmacy of Choice:  [id number] - [place], [place].   Approved medications will be sent to pharmacy, we will reach out to you if there is an issue.  Requests made after 3pm may not be addressed until following business day!

## 2019-07-16 MED ORDER — GABAPENTIN 100 MG PO CAPS: 200.0000 mg | ORAL_CAPSULE | Freq: Two times a day (BID) | ORAL | 0 refills | Status: DC

## 2019-07-17 ENCOUNTER — Telehealth: Payer: Self-pay

## 2019-07-17 NOTE — Telephone Encounter (Signed)
Called patient to do their pre-visit COVID screening.  Call went to voicemail. Unable to do prescreening.  

## 2019-07-20 ENCOUNTER — Other Ambulatory Visit: Payer: Self-pay

## 2019-07-20 ENCOUNTER — Ambulatory Visit (INDEPENDENT_AMBULATORY_CARE_PROVIDER_SITE_OTHER): Payer: Medicare Other | Admitting: Family Medicine

## 2019-07-20 VITALS — BP 142/81 | HR 77 | Temp 97.3°F | Resp 18 | Wt 237.0 lb

## 2019-07-20 DIAGNOSIS — Z794 Long term (current) use of insulin: Secondary | ICD-10-CM | POA: Diagnosis not present

## 2019-07-20 DIAGNOSIS — E039 Hypothyroidism, unspecified: Secondary | ICD-10-CM

## 2019-07-20 DIAGNOSIS — I5042 Chronic combined systolic (congestive) and diastolic (congestive) heart failure: Secondary | ICD-10-CM

## 2019-07-20 DIAGNOSIS — E1159 Type 2 diabetes mellitus with other circulatory complications: Secondary | ICD-10-CM | POA: Diagnosis not present

## 2019-07-20 DIAGNOSIS — Z1159 Encounter for screening for other viral diseases: Secondary | ICD-10-CM

## 2019-07-20 DIAGNOSIS — Z7901 Long term (current) use of anticoagulants: Secondary | ICD-10-CM

## 2019-07-20 DIAGNOSIS — Z23 Encounter for immunization: Secondary | ICD-10-CM

## 2019-07-20 DIAGNOSIS — I10 Essential (primary) hypertension: Secondary | ICD-10-CM

## 2019-07-20 DIAGNOSIS — E782 Mixed hyperlipidemia: Secondary | ICD-10-CM | POA: Diagnosis not present

## 2019-07-20 DIAGNOSIS — Z79899 Other long term (current) drug therapy: Secondary | ICD-10-CM

## 2019-07-20 LAB — GLUCOSE, POCT (MANUAL RESULT ENTRY): POC Glucose: 194 mg/dL — AB (ref 70–99)

## 2019-07-20 NOTE — Progress Notes (Signed)
Established Patient Office Visit  Subjective:  Patient ID: Desiree Harrison, female    DOB: [birth year]  Age: 73 y.o. MRN: [id number]  CC:  Chief Complaint  Patient presents with  . Diabetes  . Hypertension  . Hyperlipidemia    HPI Desiree Harrison, 73 yo female last seen on 01/14/2019 via telemedicine visit by her PCP Molli Barrows, who presents for continued follow-up of chronic medical issues. Since her last visit she has been hospitalized due to community acquired pneumonia. Most recent Hgb A1c was done 03/11/2019 and was 7.7.       Patient reports that her breathing is doing well at this time. She has had no recent cough and no fever or chills. She feels that she has gained weight during the pandemic. She has eaten more and she feels that her blood sugars may be higher. She denies any urinary frequency or increased thirst.  She has some occasional numbness in her left foot at times.  She is also not checking her blood sugars as often. She has had no issues with chest pain or palpitations related to her heart disease.  She is established with a cardiologist.  She also has CHF which she feels is stable at this time.  She reports no increased shortness of breath with exertion or when trying to lie down at night to sleep.  She denies any swelling in her legs.  She is currently on Coumadin which is also monitored by her cardiology office.  She denies any unusual bruising or bleeding.  She has taken her blood pressure medication daily and denies any headache or dizziness related to her blood pressure.  She continues to take her thyroid medication daily.  She has some occasional mild fatigue.  She denies any intolerance to heat or cold.  She denies any unexplained weight loss or weight gain.  She denies any issues with edema/swelling.  No constipation.  Past Medical History:  Diagnosis Date  . Acute systolic heart failure ([place])   . CAD in native artery   . Cardiomyopathy ([place])   . Long term (current)  use of anticoagulants   . S/P internal cardiac defibrillator procedure    placed 10/28/15  . STEMI (ST elevation myocardial infarction) ([place])   . Type 2 diabetes mellitus, with long-term current use of insulin ([place])   . Ventricular aneurysm     Past Surgical History:  Procedure Laterality Date  . ABDOMINAL HYSTERECTOMY    . APPENDECTOMY    . BREAST BIOPSY Left   . CARDIAC CATHETERIZATION    . CHOLECYSTECTOMY    . VIDEO BRONCHOSCOPY WITH ENDOBRONCHIAL ULTRASOUND N/A 01/20/2019   Procedure: VIDEO BRONCHOSCOPY WITH BIOPSY;  Surgeon: Marshell Garfinkel, MD;  Location: [facility];  Service: Pulmonary;  Laterality: N/A;    Family History  Problem Relation Age of Onset  . Breast cancer Mother   . Coronary artery disease Mother   . Diabetes Mother   . Stroke Father     Social History   Socioeconomic History  . Marital status: Widowed    Spouse name: Not on file  . Number of children: Not on file  . Years of education: Not on file  . Highest education level: Not on file  Occupational History  . Not on file  Social Needs  . Financial resource strain: Not on file  . Food insecurity    Worry: Not on file    Inability: Not on file  . Transportation needs    Medical: Not  on file    Non-medical: Not on file  Tobacco Use  . Smoking status: Former Smoker    Packs/day: 0.25    Types: Cigarettes    Quit date: 12/30/1963    Years since quitting: 55.5  . Smokeless tobacco: Never Used  . Tobacco comment: pt smoked 5-6/cigs daily 12/30/2018  Substance and Sexual Activity  . Alcohol use: Not Currently  . Drug use: Never  . Sexual activity: Not on file  Lifestyle  . Physical activity    Days per week: Not on file    Minutes per session: Not on file  . Stress: Not on file  Relationships  . Social Herbalist on phone: Not on file    Gets together: Not on file    Attends religious service: Not on file    Active member of club or organization: Not on file    Attends meetings of  clubs or organizations: Not on file    Relationship status: Not on file  . Intimate partner violence    Fear of current or ex partner: Not on file    Emotionally abused: Not on file    Physically abused: Not on file    Forced sexual activity: Not on file  Other Topics Concern  . Not on file  Social History Narrative  . Not on file    Outpatient Medications Prior to Visit  Medication Sig Dispense Refill  . amLODipine (NORVASC) 5 MG tablet Take 1 tablet (5 mg total) by mouth daily. 90 tablet 3  . aspirin EC 81 MG tablet Take 81 mg by mouth daily.    Marland Kitchen atorvastatin (LIPITOR) 80 MG tablet Take 1 tablet (80 mg total) by mouth daily. 90 tablet 3  . carvedilol (COREG) 25 MG tablet Take 1 tablet (25 mg total) by mouth 2 (two) times daily with a meal. 180 tablet 1  . gabapentin (NEURONTIN) 100 MG capsule Take 2 capsules (200 mg total) by mouth 2 (two) times daily. 120 capsule 0  . HUMALOG MIX 50/50 KWIKPEN (50-50) 100 UNIT/ML Kwikpen Inject 60 Units into the skin 2 (two) times daily.    . insulin lispro (HUMALOG) 100 UNIT/ML injection Inject 0.1 mLs (10 Units total) into the skin 3 (three) times daily before meals. 10 mL 5  . Insulin Pen Needle (NOVOFINE) 30G X 8 MM MISC Inject 10 each into the skin as needed. 200 each 3  . levothyroxine (SYNTHROID) 50 MCG tablet TAKE 1 TABLET BY MOUTH EVERY DAY 90 tablet 1  . omeprazole (PRILOSEC) 20 MG capsule Take 30- 60 min before your first and last meals of the day    . traMADol (ULTRAM) 50 MG tablet Take 50 mg by mouth as needed.    . warfarin (COUMADIN) 3 MG tablet Take 1 to 1.5 tablets by mouth daily as directed by coumadin clinic 120 tablet 0  . albuterol (PROVENTIL HFA;VENTOLIN HFA) 108 (90 Base) MCG/ACT inhaler Inhale 1-2 puffs into the lungs every 6 (six) hours as needed for wheezing or shortness of breath. 1 Inhaler 0  . cetirizine (ZYRTEC) 10 MG tablet Take 10 mg by mouth at bedtime.    Marland Kitchen HYDROcodone-homatropine (HYCODAN) 5-1.5 MG/5ML syrup Take 5  mLs by mouth every 6 (six) hours as needed for cough.    . insulin lispro protamine-lispro (HUMALOG 50/50 MIX) (50-50) 100 UNIT/ML SUSP injection Inject 0.3 mLs (30 Units total) into the skin 2 (two) times daily before a meal. 40 mL 3  No facility-administered medications prior to visit.     Allergies  Allergen Reactions  . Lisinopril Cough    ROS Review of Systems  Constitutional: Positive for fatigue (mild, occasional). Negative for chills, fever and unexpected weight change.  HENT: Negative for sore throat and trouble swallowing.   Eyes: Negative for photophobia and visual disturbance.  Respiratory: Negative for cough and shortness of breath (not currently but occ SOB with exertion).   Cardiovascular: Negative for chest pain, palpitations and leg swelling.  Gastrointestinal: Negative for abdominal pain, blood in stool, constipation, diarrhea and nausea.  Endocrine: Negative for cold intolerance, heat intolerance, polydipsia, polyphagia and polyuria.  Genitourinary: Negative for dysuria and frequency.  Musculoskeletal: Negative for arthralgias and back pain.  Neurological: Positive for numbness (feet; left greater than right at night). Negative for dizziness and headaches.  Hematological: Negative for adenopathy. Does not bruise/bleed easily.      Objective:    Physical Exam  Constitutional: She is oriented to person, place, and time. She appears well-developed and well-nourished.  Overweight/obese older female in NAD  Neck: Normal range of motion. Neck supple. No JVD present. No thyromegaly present.  Cardiovascular: Normal rate and regular rhythm.  No appreciable carotid bruits on exam  Pulmonary/Chest: Effort normal and breath sounds normal.  Abdominal: Soft. There is no abdominal tenderness. There is no rebound and no guarding.  Genitourinary:    Genitourinary Comments: No CVA tenderness   Musculoskeletal:        General: No tenderness or edema.  Lymphadenopathy:     She has no cervical adenopathy.  Neurological: She is alert and oriented to person, place, and time. No cranial nerve deficit.  Skin: Skin is warm and dry.  Psychiatric: She has a normal mood and affect. Her behavior is normal.  Nursing note and vitals reviewed. DM foot exam; No active skin breakdown on the feet; left great toenail with thickening and slight deformity; dry skin on the soles of the feet; 2 plus dorsalis pedis and posterior tibial pulses  BP (!) 142/81   Pulse 77   Temp (!) 97.3 F (36.3 C) (Temporal)   Resp 18   Wt 237 lb (107.5 kg)   SpO2 95%   BMI 37.12 kg/m  Wt Readings from Last 3 Encounters:  07/20/19 237 lb (107.5 kg)  03/18/19 216 lb (98 kg)  03/11/19 211 lb 6.4 oz (95.9 kg)     Health Maintenance Due  Topic Date Due  . Hepatitis C Screening  [birth year]  . FOOT EXAM  03/09/1957  . URINE MICROALBUMIN  03/09/1957  . COLONOSCOPY  03/09/1997  . OPHTHALMOLOGY EXAM  01/07/2019  . MAMMOGRAM  03/25/2019  . INFLUENZA VACCINE  04/11/2019    Lab Results  Component Value Date   TSH 4.210 03/11/2019   Lab Results  Component Value Date   WBC 11.7 (H) 01/21/2019   HGB 10.6 (L) 01/21/2019   HCT 33.9 (L) 01/21/2019   MCV 81.9 01/21/2019   PLT 255 01/21/2019   Lab Results  Component Value Date   NA 142 03/11/2019   K 4.5 03/11/2019   CO2 23 03/11/2019   GLUCOSE 95 03/11/2019   BUN 17 03/11/2019   CREATININE 1.06 (H) 03/11/2019   BILITOT 0.3 03/11/2019   ALKPHOS 75 03/11/2019   AST 22 03/11/2019   ALT 17 03/11/2019   PROT 7.1 03/11/2019   ALBUMIN 3.6 (L) 03/11/2019   CALCIUM 8.9 03/11/2019   ANIONGAP 9 01/21/2019   Lab Results  Component Value Date  CHOL 129 03/11/2019   Lab Results  Component Value Date   HDL 37 (L) 03/11/2019   Lab Results  Component Value Date   LDLCALC 67 03/11/2019   Lab Results  Component Value Date   TRIG 124 03/11/2019   Lab Results  Component Value Date   CHOLHDL 3.5 03/11/2019   Lab Results  Component  Value Date   HGBA1C 7.7 (H) 03/11/2019      Assessment & Plan:  1. Type 2 diabetes mellitus with other circulatory complication, with long-term current use of insulin ([place]); Type 2 DM with peripheral neuropathy Patient is encouraged to make sure that she is monitoring her blood sugars and should restart a healthier diet with regular exercise. She should also monitor her blood sugars more regularly. She will have labs in follow-up of her diabetes including Hgb A1c, urine microalbumin, CMET and will also be referred to ophthalmology.  - Glucose (CBG) - Hemoglobin A1c - Microalbumin/Creatinine Ratio, Urine - Comprehensive metabolic panel - Ambulatory referral to Ophthalmology  2. Essential hypertension Discussed goal BP of 130/80 or less. Continue current medication regimen of amlodipine and carvedilol. Patient with a history per chart of having cough associated with lisinopril so she is not currently on an ace inhibitor. If her microalbumin is significantly abnormal then she may benefit from an ARB.   3. Mixed hyperlipidemia Continue use of atorvastatin due to a history of CVA and diabetes. CMET in follow-up statin use.  - Comprehensive metabolic panel  4. Screening hepatitis C  Patient agrees to have screening for hepatitis C as she has an increased risk of hepatitis due to her age group.  - Hepatitis C Antibody  5. Needs flu shot Patient was offered and agreed to have flu shot at today's visit and was provided with educational handout regarding the immunization.  - Flu Vaccine QUAD 6+ mos PF IM (Fluarix Quad PF)  6. Long-term use of high-risk medication Patient is having issues with occasional numbness in her left foot and is on metformin which can also lower her Vitamin B12 level. She will also have CMET in follow-up of statin medication use as well as the use of medication for the treatment of DM, HTN and CAD.  - Vitamin B12 -Comprehensive Metabolic Panel  7. Long term current use  of anticoagulant therapy; ventricular aneurysm Will check CBC as patient with ventricular aneurysm and is on coumadin which is monitored by her cardiology practice.  - CBC  8. Hypothyroidism, unspecified type Will check TSH in follow-up of hypothyroidism and she will be notified if a change in her dose of  levothyroxine is needed based on her lab visit   9. Chronic combined systolic and diastolic heart failure [facility]) She feels that she her CHF is controlled. On exam, she does not have rales or peripheral edema.   An After Visit Summary was printed and given to the patient.  Follow-up: Return in about 4 months (around 11/17/2019) for DM and chronic issues.   Antony Blackbird, MD

## 2019-07-20 NOTE — Patient Instructions (Signed)
Carbohydrate Counting for Diabetes Mellitus, Adult  Carbohydrate counting is a method of keeping track of how many carbohydrates you eat. Eating carbohydrates naturally increases the amount of sugar (glucose) in the blood. Counting how many carbohydrates you eat helps keep your blood glucose within normal limits, which helps you manage your diabetes (diabetes mellitus). It is important to know how many carbohydrates you can safely have in each meal. This is different for every person. A diet and nutrition specialist (registered dietitian) can help you make a meal plan and calculate how many carbohydrates you should have at each meal and snack. Carbohydrates are found in the following foods:  Grains, such as breads and cereals.  Dried beans and soy products.  Starchy vegetables, such as potatoes, peas, and corn.  Fruit and fruit juices.  Milk and yogurt.  Sweets and snack foods, such as cake, cookies, candy, chips, and soft drinks. How do I count carbohydrates? There are two ways to count carbohydrates in food. You can use either of the methods or a combination of both. Reading "Nutrition Facts" on packaged food The "Nutrition Facts" list is included on the labels of almost all packaged foods and beverages in the [place] It includes:  The serving size.  Information about nutrients in each serving, including the grams (g) of carbohydrate per serving. To use the "Nutrition Facts":  Decide how many servings you will have.  Multiply the number of servings by the number of carbohydrates per serving.  The resulting number is the total amount of carbohydrates that you will be having. Learning standard serving sizes of other foods When you eat carbohydrate foods that are not packaged or do not include "Nutrition Facts" on the label, you need to measure the servings in order to count the amount of carbohydrates:  Measure the foods that you will eat with a food scale or measuring cup, if needed.   Decide how many standard-size servings you will eat.  Multiply the number of servings by 15. Most carbohydrate-rich foods have about 15 g of carbohydrates per serving. ? For example, if you eat 8 oz (170 g) of strawberries, you will have eaten 2 servings and 30 g of carbohydrates (2 servings x 15 g = 30 g).  For foods that have more than one food mixed, such as soups and casseroles, you must count the carbohydrates in each food that is included. The following list contains standard serving sizes of common carbohydrate-rich foods. Each of these servings has about 15 g of carbohydrates:   hamburger bun or  English muffin.   oz (15 mL) syrup.   oz (14 g) jelly.  1 slice of bread.  1 six-inch tortilla.  3 oz (85 g) cooked rice or pasta.  4 oz (113 g) cooked dried beans.  4 oz (113 g) starchy vegetable, such as peas, corn, or potatoes.  4 oz (113 g) hot cereal.  4 oz (113 g) mashed potatoes or  of a large baked potato.  4 oz (113 g) canned or frozen fruit.  4 oz (120 mL) fruit juice.  4-6 crackers.  6 chicken nuggets.  6 oz (170 g) unsweetened dry cereal.  6 oz (170 g) plain fat-free yogurt or yogurt sweetened with artificial sweeteners.  8 oz (240 mL) milk.  8 oz (170 g) fresh fruit or one small piece of fruit.  24 oz (680 g) popped popcorn. Example of carbohydrate counting Sample meal  3 oz (85 g) chicken breast.  6 oz (170 g)   brown rice.  4 oz (113 g) corn.  8 oz (240 mL) milk.  8 oz (170 g) strawberries with sugar-free whipped topping. Carbohydrate calculation 1. Identify the foods that contain carbohydrates: ? Rice. ? Corn. ? Milk. ? Strawberries. 2. Calculate how many servings you have of each food: ? 2 servings rice. ? 1 serving corn. ? 1 serving milk. ? 1 serving strawberries. 3. Multiply each number of servings by 15 g: ? 2 servings rice x 15 g = 30 g. ? 1 serving corn x 15 g = 15 g. ? 1 serving milk x 15 g = 15 g. ? 1 serving  strawberries x 15 g = 15 g. 4. Add together all of the amounts to find the total grams of carbohydrates eaten: ? 30 g + 15 g + 15 g + 15 g = 75 g of carbohydrates total. Summary  Carbohydrate counting is a method of keeping track of how many carbohydrates you eat.  Eating carbohydrates naturally increases the amount of sugar (glucose) in the blood.  Counting how many carbohydrates you eat helps keep your blood glucose within normal limits, which helps you manage your diabetes.  A diet and nutrition specialist (registered dietitian) can help you make a meal plan and calculate how many carbohydrates you should have at each meal and snack. This information is not intended to replace advice given to you by your health care provider. Make sure you discuss any questions you have with your health care provider. Document Released: 08/27/2005 Document Revised: 03/21/2017 Document Reviewed: 02/08/2016 Elsevier Patient Education  2020 [employer]  

## 2019-07-21 LAB — COMPREHENSIVE METABOLIC PANEL WITH GFR
ALT: 13 IU/L (ref 0–32)
AST: 20 IU/L (ref 0–40)
Albumin/Globulin Ratio: 1 — ABNORMAL LOW (ref 1.2–2.2)
Albumin: 3.5 g/dL — ABNORMAL LOW (ref 3.7–4.7)
Alkaline Phosphatase: 131 IU/L — ABNORMAL HIGH (ref 39–117)
BUN/Creatinine Ratio: 17 (ref 12–28)
BUN: 19 mg/dL (ref 8–27)
Bilirubin Total: <0.2 mg/dL (ref 0.0–1.2)
CO2: 23 mmol/L (ref 20–29)
Calcium: 8.6 mg/dL — ABNORMAL LOW (ref 8.7–10.3)
Chloride: 110 mmol/L — ABNORMAL HIGH (ref 96–106)
Creatinine, Ser: 1.1 mg/dL — ABNORMAL HIGH (ref 0.57–1.00)
GFR calc Af Amer: 58 mL/min/1.73 — ABNORMAL LOW
GFR calc non Af Amer: 50 mL/min/1.73 — ABNORMAL LOW
Globulin, Total: 3.4 g/dL (ref 1.5–4.5)
Glucose: 199 mg/dL — ABNORMAL HIGH (ref 65–99)
Potassium: 4.2 mmol/L (ref 3.5–5.2)
Sodium: 145 mmol/L — ABNORMAL HIGH (ref 134–144)
Total Protein: 6.9 g/dL (ref 6.0–8.5)

## 2019-07-21 LAB — HEPATITIS C ANTIBODY: Hep C Virus Ab: <0.1 {s_co_ratio} (ref 0.0–0.9)

## 2019-07-21 LAB — HEMOGLOBIN A1C
Est. average glucose Bld gHb Est-mCnc: 217 mg/dL
Hgb A1c MFr Bld: 9.2 % — ABNORMAL HIGH (ref 4.8–5.6)

## 2019-07-21 LAB — CBC
Hematocrit: 42.3 % (ref 34.0–46.6)
Hemoglobin: 13.6 g/dL (ref 11.1–15.9)
MCH: 25.5 pg — ABNORMAL LOW (ref 26.6–33.0)
MCHC: 32.2 g/dL (ref 31.5–35.7)
MCV: 79 fL (ref 79–97)
Platelets: 192 x10E3/uL (ref 150–450)
RBC: 5.33 x10E6/uL — ABNORMAL HIGH (ref 3.77–5.28)
RDW: 15.3 % (ref 11.7–15.4)
WBC: 6 x10E3/uL (ref 3.4–10.8)

## 2019-07-21 LAB — MICROALBUMIN / CREATININE URINE RATIO
Creatinine, Urine: 107.5 mg/dL
Microalb/Creat Ratio: 177 mg/g{creat} — ABNORMAL HIGH (ref 0–29)
Microalbumin, Urine: 190.7 ug/mL

## 2019-07-21 LAB — VITAMIN B12: Vitamin B-12: 355 pg/mL (ref 232–1245)

## 2019-07-24 ENCOUNTER — Other Ambulatory Visit: Payer: Self-pay

## 2019-07-24 ENCOUNTER — Ambulatory Visit (INDEPENDENT_AMBULATORY_CARE_PROVIDER_SITE_OTHER): Payer: Medicare Other | Admitting: Pharmacist

## 2019-07-24 DIAGNOSIS — I253 Aneurysm of heart: Secondary | ICD-10-CM | POA: Diagnosis not present

## 2019-07-24 DIAGNOSIS — Z7901 Long term (current) use of anticoagulants: Secondary | ICD-10-CM | POA: Diagnosis not present

## 2019-07-24 LAB — POCT INR: INR: 3.3 — AB (ref 2.0–3.0)

## 2019-07-27 NOTE — Progress Notes (Signed)
Patient notified of results & recommendations via MyChart.

## 2019-08-10 ENCOUNTER — Ambulatory Visit (INDEPENDENT_AMBULATORY_CARE_PROVIDER_SITE_OTHER): Payer: Medicare Other | Admitting: Pharmacist

## 2019-08-10 ENCOUNTER — Other Ambulatory Visit: Payer: Self-pay

## 2019-08-10 DIAGNOSIS — Z7901 Long term (current) use of anticoagulants: Secondary | ICD-10-CM | POA: Diagnosis not present

## 2019-08-10 DIAGNOSIS — I253 Aneurysm of heart: Secondary | ICD-10-CM | POA: Diagnosis not present

## 2019-08-10 LAB — POCT INR: INR: 2.8 (ref 2.0–3.0)

## 2019-08-15 ENCOUNTER — Other Ambulatory Visit: Payer: Self-pay | Admitting: Family Medicine

## 2019-08-17 ENCOUNTER — Other Ambulatory Visit: Payer: Self-pay | Admitting: Cardiovascular Disease

## 2019-08-20 ENCOUNTER — Ambulatory Visit (INDEPENDENT_AMBULATORY_CARE_PROVIDER_SITE_OTHER): Payer: Medicare Other | Admitting: *Deleted

## 2019-08-20 DIAGNOSIS — I5042 Chronic combined systolic (congestive) and diastolic (congestive) heart failure: Secondary | ICD-10-CM

## 2019-08-20 LAB — CUP PACEART REMOTE DEVICE CHECK
Battery Remaining Longevity: 144 mo
Battery Remaining Percentage: 100 %
Brady Statistic RV Percent Paced: 0 %
Date Time Interrogation Session: 20201210021300
HighPow Impedance: 77 Ohm
Implantable Pulse Generator Implant Date: 20170217
Lead Channel Impedance Value: 902 Ohm
Lead Channel Pacing Threshold Amplitude: 0.7 V
Lead Channel Pacing Threshold Pulse Width: 0.4 ms
Lead Channel Setting Pacing Amplitude: 2 V
Lead Channel Setting Pacing Pulse Width: 0.4 ms
Lead Channel Setting Sensing Sensitivity: 0.5 mV
Pulse Gen Serial Number: 213123

## 2019-09-02 ENCOUNTER — Other Ambulatory Visit: Payer: Self-pay

## 2019-09-02 MED ORDER — HUMALOG MIX 50/50 KWIKPEN (50-50) 100 UNIT/ML ~~LOC~~ SUPN: 30.0000 [IU] | PEN_INJECTOR | Freq: Two times a day (BID) | SUBCUTANEOUS | 2 refills | Status: DC

## 2019-09-08 ENCOUNTER — Ambulatory Visit (INDEPENDENT_AMBULATORY_CARE_PROVIDER_SITE_OTHER): Payer: Medicare Other | Admitting: Pharmacist Clinician (PhC)/ Clinical Pharmacy Specialist

## 2019-09-08 ENCOUNTER — Other Ambulatory Visit: Payer: Self-pay

## 2019-09-08 DIAGNOSIS — Z7901 Long term (current) use of anticoagulants: Secondary | ICD-10-CM

## 2019-09-08 DIAGNOSIS — I253 Aneurysm of heart: Secondary | ICD-10-CM

## 2019-09-08 LAB — POCT INR: INR: 2.5 (ref 2.0–3.0)

## 2019-09-18 ENCOUNTER — Other Ambulatory Visit: Payer: Self-pay

## 2019-09-18 MED ORDER — GABAPENTIN 100 MG PO CAPS: 200.0000 mg | ORAL_CAPSULE | Freq: Two times a day (BID) | ORAL | 0 refills | Status: DC

## 2019-09-26 NOTE — Progress Notes (Signed)
ICD remote 

## 2019-10-01 ENCOUNTER — Encounter: Payer: Self-pay | Admitting: Pharmacist Clinician (PhC)/ Clinical Pharmacy Specialist

## 2019-10-09 ENCOUNTER — Other Ambulatory Visit: Payer: Self-pay | Admitting: Family Medicine

## 2019-10-16 ENCOUNTER — Telehealth: Payer: Self-pay | Admitting: Cardiovascular Disease

## 2019-10-16 NOTE — Telephone Encounter (Signed)
Appt change to September 22

## 2019-10-16 NOTE — Telephone Encounter (Signed)
New Message    Pt is calling and is wondering if she should go to her coumadin appt on Monday because someone in her household has been exposed and is in [place].  She says they have not been tested    Please call

## 2019-11-02 ENCOUNTER — Other Ambulatory Visit: Payer: Self-pay

## 2019-11-02 ENCOUNTER — Encounter: Payer: Self-pay | Admitting: Cardiovascular Disease

## 2019-11-02 ENCOUNTER — Ambulatory Visit (INDEPENDENT_AMBULATORY_CARE_PROVIDER_SITE_OTHER): Payer: Medicare Other | Admitting: Pharmacist

## 2019-11-02 ENCOUNTER — Ambulatory Visit (INDEPENDENT_AMBULATORY_CARE_PROVIDER_SITE_OTHER): Payer: Medicare Other | Admitting: Cardiovascular Disease

## 2019-11-02 VITALS — BP 132/70 | HR 68 | Ht 67.0 in | Wt 243.8 lb

## 2019-11-02 DIAGNOSIS — I5042 Chronic combined systolic (congestive) and diastolic (congestive) heart failure: Secondary | ICD-10-CM

## 2019-11-02 DIAGNOSIS — E1169 Type 2 diabetes mellitus with other specified complication: Secondary | ICD-10-CM

## 2019-11-02 DIAGNOSIS — E785 Hyperlipidemia, unspecified: Secondary | ICD-10-CM

## 2019-11-02 DIAGNOSIS — I253 Aneurysm of heart: Secondary | ICD-10-CM

## 2019-11-02 DIAGNOSIS — I2510 Atherosclerotic heart disease of native coronary artery without angina pectoris: Secondary | ICD-10-CM | POA: Diagnosis not present

## 2019-11-02 DIAGNOSIS — Z7901 Long term (current) use of anticoagulants: Secondary | ICD-10-CM

## 2019-11-02 DIAGNOSIS — Z6838 Body mass index (BMI) 38.0-38.9, adult: Secondary | ICD-10-CM

## 2019-11-02 DIAGNOSIS — E669 Obesity, unspecified: Secondary | ICD-10-CM

## 2019-11-02 DIAGNOSIS — I10 Essential (primary) hypertension: Secondary | ICD-10-CM

## 2019-11-02 DIAGNOSIS — Z95810 Presence of automatic (implantable) cardiac defibrillator: Secondary | ICD-10-CM

## 2019-11-02 LAB — POCT INR: INR: 2.9 (ref 2.0–3.0)

## 2019-11-02 NOTE — Progress Notes (Signed)
Cardiology Office Note:    Date:  11/02/2019   ID:  Desiree Harrison, DOB Jan 09, 1946, MRN [id number]  PCP:  Scot Jun, FNP  Cardiologist:  Sanda Klein, MD  Electrophysiologist:  None   Referring MD: Scot Jun, FNP   Chief Complaint  Patient presents with  . Congestive Heart Failure  . Pacemaker Check    ICD  Desiree Harrison is a 74 y.o. female who is being seen today for the evaluation of CHF/CAD/ICD at the request of Scot Jun, FNP.   History of Present Illness:    Desiree Harrison is a 74 y.o. 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  She is on chronic warfarin anticoagulation.  She also has diabetes mellitus requiring insulin, complicated by neuropathy, hypertension and hypercholesterolemia, history of gout.  To date, she has not received therapy from her defibrillator.    She had previously seen Dr. Sabino Donovan at [facility], most recent visit in August 2019.  Her initial myocardial infarction was in November 2016.  She did not have angina pectoris, but her myocardial infarction manifested as nausea and vomiting shortness of breath.  She presented late and she was found to have a persistently occluded LAD that was not amenable to PCI.  There were no other meaningful coronary stenoses.  She had an aneurysmal apex.  Roughly 3 months later she underwent implantation of a defibrillator(Dr. Remus Blake, single-chamber [place] serial [id number], right ventricular lead [id number], DFT testing was not performed).  She has not required hospitalizations for heart failure as far as I can  tell has never had documented LV apical thrombus or embolic events, but there was considerable concern about this possibility because of the aneurysmal apex.  The patient specifically denies any chest pain at rest exertion, dyspnea at rest or with exertion, orthopnea, paroxysmal nocturnal dyspnea, syncope, palpitations, focal neurological deficits, intermittent claudication, lower extremity edema, unexplained weight gain, cough, hemoptysis or wheezing.  She has noticed that the majority of her left arm is larger than the right.  She has not had any lumps or tenderness in her breast and has never had breast cancer surgery.  There is no lymphadenopathy in her axilla.  Comprehensive interrogation of her defibrillator was performed today.  The presenting rhythm is sinus and she does not require ventricular pacing.  Battery voltage is essentially still the beginning of life with estimated generator longevity of 12 years.  Lead parameters are excellent and unchanged from last year.  She has less than 1% ventricular pacing.  She has not had sustained ventricular arrhythmia or atrial fibrillation.  She has had 2 episodes of nonsustained ventricular tachycardia, one in March and the other in December 2020, consisting of 9 and 12 consecutive beats respectively.   the device was implanted for primary prevention and there has been no detected ventricular tachycardia since device implantation, I reprogrammed it as a "shock box" with therapy beginning only at 200 bpm, but left a monitor zone at 170 bpm.  She has a history of cough with both lisinopril and losartan.  Past Medical History:  Diagnosis Date  . Acute systolic heart failure ([place])   .  CAD in native artery   . Cardiomyopathy ([place])   . Long term (current) use of anticoagulants   . S/P internal cardiac defibrillator procedure    placed 10/28/15  . STEMI (ST elevation myocardial infarction) ([place])   . Type 2 diabetes mellitus, with long-term current use of  insulin ([place])   . Ventricular aneurysm     Past Surgical History:  Procedure Laterality Date  . ABDOMINAL HYSTERECTOMY    . APPENDECTOMY    . BREAST BIOPSY Left   . CARDIAC CATHETERIZATION    . CHOLECYSTECTOMY    . VIDEO BRONCHOSCOPY WITH ENDOBRONCHIAL ULTRASOUND N/A 01/20/2019   Procedure: VIDEO BRONCHOSCOPY WITH BIOPSY;  Surgeon: Marshell Garfinkel, MD;  Location: [facility];  Service: Pulmonary;  Laterality: N/A;    Current Medications: Current Meds  Medication Sig  . amLODipine (NORVASC) 5 MG tablet Take 1 tablet (5 mg total) by mouth daily.  Marland Kitchen aspirin EC 81 MG tablet Take 81 mg by mouth daily.  Marland Kitchen atorvastatin (LIPITOR) 80 MG tablet Take 1 tablet (80 mg total) by mouth daily.  . carvedilol (COREG) 25 MG tablet Take 1 tablet (25 mg total) by mouth 2 (two) times daily with a meal.  . gabapentin (NEURONTIN) 100 MG capsule TAKE 2 CAPSULES (200 MG TOTAL) BY MOUTH 2 (TWO) TIMES DAILY.  Marland Kitchen HUMALOG MIX 50/50 KWIKPEN (50-50) 100 UNIT/ML Kwikpen Inject 30 Units into the skin 2 (two) times daily. Dx: E11.59, Z79.4  . insulin lispro (HUMALOG) 100 UNIT/ML injection Inject 0.1 mLs (10 Units total) into the skin 3 (three) times daily before meals.  Marland Kitchen levothyroxine (SYNTHROID) 50 MCG tablet TAKE 1 TABLET BY MOUTH EVERY DAY  . omeprazole (PRILOSEC) 20 MG capsule Take 30- 60 min before your first and last meals of the day  . traMADol (ULTRAM) 50 MG tablet Take 50 mg by mouth as needed.  . warfarin (COUMADIN) 3 MG tablet TAKE 1 TO 1.5 TABLETS BY MOUTH DAILY AS DIRECTED BY COUMADIN CLINIC     Allergies:   Lisinopril   Social History   Socioeconomic History  . Marital status: Widowed    Spouse name: Not on file  . Number of children: Not on file  . Years of education: Not on file  . Highest education level: Not on file  Occupational History  . Not on file  Tobacco Use  . Smoking status: Former Smoker    Packs/day: 0.25    Types: Cigarettes    Quit date: 12/30/1963    Years since quitting: 55.8   . Smokeless tobacco: Never Used  . Tobacco comment: pt smoked 5-6/cigs daily 12/30/2018  Substance and Sexual Activity  . Alcohol use: Not Currently  . Drug use: Never  . Sexual activity: Not on file  Other Topics Concern  . Not on file  Social History Narrative  . Not on file   Social Determinants of Health   Financial Resource Strain:   . Difficulty of Paying Living Expenses: Not on file  Food Insecurity:   . Worried About Charity fundraiser in the Last Year: Not on file  . Ran Out of Food in the Last Year: Not on file  Transportation Needs:   . Lack of Transportation (Medical): Not on file  . Lack of Transportation (Non-Medical): Not on file  Physical Activity:   . Days of Exercise per Week: Not on file  . Minutes of Exercise per Session: Not on file  Stress:   . Feeling of Stress : Not on  file  Social Connections:   . Frequency of Communication with Friends and Family: Not on file  . Frequency of Social Gatherings with Friends and Family: Not on file  . Attends Religious Services: Not on file  . Active Member of Clubs or Organizations: Not on file  . Attends Archivist Meetings: Not on file  . Marital Status: Not on file     Family History: The patient's family history includes Breast cancer in her mother; Coronary artery disease in her mother; Diabetes in her mother; Stroke in her father.  The patient's mother had bypass surgery at age 46 but lived to be 73 years old.  Patient's grandmother died with a myocardial infarction at age 59.  ROS:   Please see the history of present illness.     All other systems are reviewed and are negative.   EKGs/Labs/Other Studies Reviewed:     EKG:  EKG is  ordered today.  It shows normal sinus rhythm, generalized low voltage, QS pattern in leads V1-V3 and T wave inversion V4-V6, QTC 425 ms  Recent Labs: 01/12/2019: Pro B Natriuretic peptide (BNP) 57.0 01/21/2019: Magnesium 1.8 03/11/2019: TSH 4.210 07/20/2019: ALT 13;  BUN 19; Creatinine, Ser 1.10; Hemoglobin 13.6; Platelets 192; Potassium 4.2; Sodium 145  Recent Lipid Panel    Component Value Date/Time   CHOL 129 03/11/2019 1053   TRIG 124 03/11/2019 1053   HDL 37 (L) 03/11/2019 1053   CHOLHDL 3.5 03/11/2019 1053   LDLCALC 67 03/11/2019 1053    Physical Exam:    VS:  BP 132/70   Pulse 68   Ht 5\' 7"  (1.702 m)   Wt 243 lb 12.8 oz (110.6 kg)   BMI 38.18 kg/m   Wt Readings from Last 3 Encounters:  11/02/19 243 lb 12.8 oz (110.6 kg)  07/20/19 237 lb (107.5 kg)  03/18/19 216 lb (98 kg)     General: Alert, oriented x3, no distress, severely obese.  Healthy left subclavian ICD site Head: no evidence of trauma, PERRL, EOMI, no exophtalmos or lid lag, no myxedema, no xanthelasma; normal ears, nose and oropharynx Neck: normal jugular venous pulsations and no hepatojugular reflux; brisk carotid pulses without delay and no carotid bruits Chest: clear to auscultation, no signs of consolidation by percussion or palpation, normal fremitus, symmetrical and full respiratory excursions Cardiovascular: normal position and quality of the apical impulse, regular rhythm, normal first and second heart sounds, no murmurs, rubs or gallops Abdomen: no tenderness or distention, no masses by palpation, no abnormal pulsatility or arterial bruits, normal bowel sounds, no hepatosplenomegaly Extremities: no clubbing, cyanosis or edema; 2+ radial, ulnar and brachial pulses bilaterally; 2+ right femoral, posterior tibial and dorsalis pedis pulses; 2+ left femoral, posterior tibial and dorsalis pedis pulses; no subclavian or femoral bruits The left arm circumference is indeed larger than on the right.  There is however no evidence of edema of the hand.  There is no axillary lymphadenopathy and no evidence of any breast lesions. Neurological: grossly nonfocal Psych: Normal mood and affect   ASSESSMENT:    1. Chronic combined systolic and diastolic heart failure ([place])   2.  Coronary artery disease involving native coronary artery of native heart without angina pectoris   3. Left ventricular aneurysm   4. Long term current use of anticoagulant therapy   5. ICD (implantable cardioverter-defibrillator) in place   6. Dyslipidemia (high LDL; low HDL)   7. Essential hypertension   8. Diabetes mellitus type 2 in obese ([place])  9. Class 2 severe obesity due to excess calories with serious comorbidity and body mass index (BMI) of 38.0 to 38.9 in adult [facility])    PLAN:    In order of problems listed above:  1. CHF: Very well compensated, NYHA functional class I, on carvedilol, but unable to take either ACE inhibitor or angiotensin receptor blocker due to cough.  Improved ejection fraction up to 50%.  She does not require diuretics. 2. CAD: She denies angina pectoris.  Note that she never had angina before her myocardial infarction but presented with nausea, vomiting, dyspnea.  On aspirin and statin. 3. LV aneurysm: She has not had confirmed thromboembolic events. 4. Warfarin: No bleeding complications.  No longer on her Coumadin clinic. 5. ICD: Normal device function.  She has never received tachycardia therapies and has infrequent episodes of brief nonsustained VT.  Continue remote downloads every 3 months and yearly office visits. 6. HLP: Excellent lipid profile with, LDL less than 70.  HDL is relatively low and has only expected to improve if she loses weight and becomes more physically active.   7. DM: Notes report that she was intolerant to [place], although an SGLT2 inhibitor would be a good choice in a patient with depressed EF from coronary disease.  GLP-1 agonist such as Victoza is another consideration.  Either could be an improvement over sulfonylurea as far as his cardiovascular outcomes.  Avoid Actos. 8. HTN: Adequate control. 9. Severe obesity: strongly recommend    Medication Adjustments/Labs and Tests Ordered: Current medicines are reviewed at length with  the patient today.  Concerns regarding medicines are outlined above.  Orders Placed This Encounter  Procedures  . EKG 12-Lead   No orders of the defined types were placed in this encounter.   Patient Instructions  Medication Instructions:  No changes *If you need a refill on your cardiac medications before your next appointment, please call your pharmacy*  Lab Work: None ordered If you have labs (blood work) drawn today and your tests are completely normal, you will receive your results only by: Marland Kitchen MyChart Message (if you have MyChart) OR . A paper copy in the mail If you have any lab test that is abnormal or we need to change your treatment, we will call you to review the results.  Testing/Procedures: None ordered  Follow-Up: At [facility], you and your health needs are our priority.  As part of our continuing mission to provide you with exceptional heart care, we have created designated Provider Care Teams.  These Care Teams include your primary Cardiologist (physician) and Advanced Practice Providers (APPs -  Physician Assistants and Nurse Practitioners) who all work together to provide you with the care you need, when you need it.  Your next appointment:   12 month(s)  The format for your next appointment:   In Person  Provider:   Sanda Klein, MD     Signed, Sanda Klein, MD  11/02/2019 2:37 PM    [place]

## 2019-11-02 NOTE — Patient Instructions (Signed)

## 2019-11-12 ENCOUNTER — Other Ambulatory Visit: Payer: Self-pay | Admitting: Cardiovascular Disease

## 2019-11-16 ENCOUNTER — Other Ambulatory Visit: Payer: Self-pay | Admitting: Family Medicine

## 2019-11-19 ENCOUNTER — Ambulatory Visit (INDEPENDENT_AMBULATORY_CARE_PROVIDER_SITE_OTHER): Payer: Medicare Other | Admitting: *Deleted

## 2019-11-19 DIAGNOSIS — I5042 Chronic combined systolic (congestive) and diastolic (congestive) heart failure: Secondary | ICD-10-CM | POA: Diagnosis not present

## 2019-11-19 LAB — CUP PACEART REMOTE DEVICE CHECK
Battery Remaining Longevity: 144 mo
Battery Remaining Percentage: 100 %
Brady Statistic RV Percent Paced: 0 %
Date Time Interrogation Session: 20210311021000
HighPow Impedance: 81 Ohm
Implantable Pulse Generator Implant Date: 20170217
Lead Channel Impedance Value: 926 Ohm
Lead Channel Pacing Threshold Amplitude: 1 V
Lead Channel Pacing Threshold Pulse Width: 0.4 ms
Lead Channel Setting Pacing Amplitude: 2 V
Lead Channel Setting Pacing Pulse Width: 0.4 ms
Lead Channel Setting Sensing Sensitivity: 0.5 mV
Pulse Gen Serial Number: 213123

## 2019-11-19 NOTE — Progress Notes (Signed)
ICD Remote  

## 2019-12-07 ENCOUNTER — Encounter: Payer: Self-pay | Admitting: Internal Medicine

## 2019-12-07 ENCOUNTER — Ambulatory Visit (INDEPENDENT_AMBULATORY_CARE_PROVIDER_SITE_OTHER): Payer: Medicare Other | Admitting: Internal Medicine

## 2019-12-07 DIAGNOSIS — I10 Essential (primary) hypertension: Secondary | ICD-10-CM | POA: Diagnosis not present

## 2019-12-07 DIAGNOSIS — E1159 Type 2 diabetes mellitus with other circulatory complications: Secondary | ICD-10-CM

## 2019-12-07 DIAGNOSIS — Z794 Long term (current) use of insulin: Secondary | ICD-10-CM | POA: Diagnosis not present

## 2019-12-07 MED ORDER — HUMALOG MIX 50/50 KWIKPEN (50-50) 100 UNIT/ML ~~LOC~~ SUPN: 30.0000 [IU] | PEN_INJECTOR | Freq: Two times a day (BID) | SUBCUTANEOUS | 5 refills | Status: DC

## 2019-12-07 MED ORDER — GABAPENTIN 100 MG PO CAPS: 200.0000 mg | ORAL_CAPSULE | Freq: Two times a day (BID) | ORAL | 5 refills | Status: DC

## 2019-12-07 MED ORDER — INSULIN LISPRO 100 UNIT/ML ~~LOC~~ SOLN: 10.0000 [IU] | Freq: Three times a day (TID) | SUBCUTANEOUS | 5 refills | Status: DC

## 2019-12-07 NOTE — Progress Notes (Signed)
Virtual Visit via Telephone Note  I connected with Desiree Harrison, on 12/07/2019 at 9:32 AM by telephone due to the COVID-19 pandemic and verified that I am speaking with the correct person using two identifiers.   Consent: I discussed the limitations, risks, security and privacy concerns of performing an evaluation and management service by telephone and the availability of in person appointments. I also discussed with the patient that there may be a patient responsible charge related to this service. The patient expressed understanding and agreed to proceed.   Location of Patient: Home   Location of Provider: Clinic    Persons participating in Telemedicine visit: Desiree Harrison [facility] Dr. Juleen China      History of Present Illness: Patient has a visit to f/u chronic medical problems.   Chronic HTN Disease Monitoring:  Home BP Monitoring - Yes, checks a few times per week. 124-128/72-74 on average.  Chest pain- no  Dyspnea- no Headache - no  Medications: Coreg 25 mg BID, Amlodipine 5 mg  Compliance- yesLightheadedness- no  Edema- no    Diabetes mellitus, Type 2 Disease Monitoring             Blood Sugar Ranges: Fasting - 128 this AM, reports she typically gets this number or sometimes a little higher around 140 if she "does something she's not supposed to do"              Polyuria: No              Visual problems: no   Urine Microalbumin elevated nov 2020   Last A1C: 9.2 (Nov 2020)   Medications: Humalog 50/50 mix 30u BID, Humalog 10 u TID with meals  Medication Compliance: yes  Medication Side Effects             Hypoglycemia: no       Past Medical History:  Diagnosis Date  . Acute systolic heart failure ([place])   . CAD in native artery   . Cardiomyopathy ([place])   . Long term (current) use of anticoagulants   . S/P internal cardiac defibrillator procedure    placed 10/28/15  . STEMI (ST elevation myocardial infarction) ([place])   . Type 2  diabetes mellitus, with long-term current use of insulin ([place])   . Ventricular aneurysm    Allergies  Allergen Reactions  . Lisinopril Cough    Current Outpatient Medications on File Prior to Visit  Medication Sig Dispense Refill  . amLODipine (NORVASC) 5 MG tablet Take 1 tablet (5 mg total) by mouth daily. 90 tablet 3  . aspirin EC 81 MG tablet Take 81 mg by mouth daily.    Marland Kitchen atorvastatin (LIPITOR) 80 MG tablet Take 1 tablet (80 mg total) by mouth daily. 90 tablet 3  . carvedilol (COREG) 25 MG tablet Take 1 tablet (25 mg total) by mouth 2 (two) times daily with a meal. 180 tablet 1  . gabapentin (NEURONTIN) 100 MG capsule TAKE 2 CAPSULES (200 MG TOTAL) BY MOUTH 2 (TWO) TIMES DAILY. 120 capsule 0  . HUMALOG MIX 50/50 KWIKPEN (50-50) 100 UNIT/ML Kwikpen INJECT 30 UNITS INTO THE SKIN 2 (TWO) TIMES DAILY. DX: E11.59, Z79.4 15 mL 0  . insulin lispro (HUMALOG) 100 UNIT/ML injection Inject 0.1 mLs (10 Units total) into the skin 3 (three) times daily before meals. 10 mL 5  . levothyroxine (SYNTHROID) 50 MCG tablet TAKE 1 TABLET BY MOUTH EVERY DAY 90 tablet 0  . omeprazole (PRILOSEC) 20 MG capsule Take 30- 60  min before your first and last meals of the day    . traMADol (ULTRAM) 50 MG tablet Take 50 mg by mouth as needed.    . warfarin (COUMADIN) 3 MG tablet TAKE 1 TO 1.5 TABLETS BY MOUTH DAILY AS DIRECTED BY COUMADIN CLINIC 120 tablet 0   No current facility-administered medications on file prior to visit.    Observations/Objective: NAD. Speaking clearly.  Work of breathing normal.  Alert and oriented. Mood appropriate.   Assessment and Plan: 1. Type 2 diabetes mellitus with other circulatory complication, with long-term current use of insulin (HCC) Fasting CBGs sound well controlled; however, most recent A1c demonstrated poor glycemic control. Will plan to check A1c this week and f/u.  Counseled on Diabetic diet, my plate method, [id number] minutes of moderate intensity exercise/week Blood sugar  logs with fasting goals of 80-120 mg/dl, random of less than 180 and in the event of sugars less than 60 mg/dl or greater than 400 mg/dl encouraged to notify the clinic. Advised on the need for annual eye exams, annual foot exams, Pneumonia vaccine. - gabapentin (NEURONTIN) 100 MG capsule; Take 2 capsules (200 mg total) by mouth 2 (two) times daily.  Dispense: 120 capsule; Refill: 5 - HUMALOG MIX 50/50 KWIKPEN (50-50) 100 UNIT/ML Kwikpen; Inject 30 Units into the skin 2 (two) times daily. Dx: E11.59, Z79.4  Dispense: 15 mL; Refill: 5 - insulin lispro (HUMALOG) 100 UNIT/ML injection; Inject 0.1 mLs (10 Units total) into the skin 3 (three) times daily before meals.  Dispense: 10 mL; Refill: 5  2. Essential hypertension BP at goal with home values. Continue current therapy.  Counseled on blood pressure goal of less than 130/80, low-sodium, DASH diet, medication compliance, 150 minutes of moderate intensity exercise per week. Discussed medication compliance, adverse effects.   Follow Up Instructions: Lab visit on 4/1   I discussed the assessment and treatment plan with the patient. The patient was provided an opportunity to ask questions and all were answered. The patient agreed with the plan and demonstrated an understanding of the instructions.   The patient was advised to call back or seek an in-person evaluation if the symptoms worsen or if the condition fails to improve as anticipated.     I provided 12 minutes total of non-face-to-face time during this encounter including median intraservice time, reviewing previous notes, investigations, ordering medications, medical decision making, coordinating care and patient verbalized understanding at the end of the visit.    Phill Myron, D.O. Primary Care at [facility]  12/07/2019, 9:32 AM

## 2019-12-09 ENCOUNTER — Other Ambulatory Visit: Payer: Self-pay

## 2019-12-09 ENCOUNTER — Other Ambulatory Visit (INDEPENDENT_AMBULATORY_CARE_PROVIDER_SITE_OTHER): Payer: Medicare Other

## 2019-12-09 DIAGNOSIS — E1159 Type 2 diabetes mellitus with other circulatory complications: Secondary | ICD-10-CM

## 2019-12-09 DIAGNOSIS — I10 Essential (primary) hypertension: Secondary | ICD-10-CM

## 2019-12-09 DIAGNOSIS — Z794 Long term (current) use of insulin: Secondary | ICD-10-CM

## 2019-12-09 MED ORDER — "INSULIN SYRINGE 31G X 5/16"" 0.3 ML MISC": 5 refills | Status: DC

## 2019-12-09 NOTE — Progress Notes (Signed)
Patient here for labs ordered during recent telehealth visit. 

## 2019-12-09 NOTE — Addendum Note (Signed)
Addended by: [name] Perches on: 12/09/2019 02:18 PM   Modules accepted: Orders

## 2019-12-10 ENCOUNTER — Other Ambulatory Visit: Payer: Self-pay | Admitting: Internal Medicine

## 2019-12-10 DIAGNOSIS — N1831 Chronic kidney disease, stage 3a: Secondary | ICD-10-CM | POA: Insufficient documentation

## 2019-12-10 LAB — COMPREHENSIVE METABOLIC PANEL
ALT: 12 IU/L (ref 0–32)
AST: 14 IU/L (ref 0–40)
Albumin/Globulin Ratio: 1 — ABNORMAL LOW (ref 1.2–2.2)
Albumin: 3.6 g/dL — ABNORMAL LOW (ref 3.7–4.7)
Alkaline Phosphatase: 105 IU/L (ref 39–117)
BUN/Creatinine Ratio: 18 (ref 12–28)
BUN: 22 mg/dL (ref 8–27)
Bilirubin Total: 0.2 mg/dL (ref 0.0–1.2)
CO2: 24 mmol/L (ref 20–29)
Calcium: 9.2 mg/dL (ref 8.7–10.3)
Chloride: 108 mmol/L — ABNORMAL HIGH (ref 96–106)
Creatinine, Ser: 1.22 mg/dL — ABNORMAL HIGH (ref 0.57–1.00)
GFR calc Af Amer: 51 mL/min/{1.73_m2} — ABNORMAL LOW (ref >59)
GFR calc non Af Amer: 44 mL/min/{1.73_m2} — ABNORMAL LOW (ref >59)
Globulin, Total: 3.5 g/dL (ref 1.5–4.5)
Glucose: 121 mg/dL — ABNORMAL HIGH (ref 65–99)
Potassium: 4.2 mmol/L (ref 3.5–5.2)
Sodium: 144 mmol/L (ref 134–144)
Total Protein: 7.1 g/dL (ref 6.0–8.5)

## 2019-12-10 LAB — HEMOGLOBIN A1C
Est. average glucose Bld gHb Est-mCnc: 229 mg/dL
Hgb A1c MFr Bld: 9.6 % — ABNORMAL HIGH (ref 4.8–5.6)

## 2019-12-11 ENCOUNTER — Other Ambulatory Visit: Payer: Self-pay

## 2019-12-11 ENCOUNTER — Ambulatory Visit
Admission: RE | Admit: 2019-12-11 | Discharge: 2019-12-11 | Disposition: A | Discharge status: Home / Self Care | Payer: Medicare Other | Source: Ambulatory Visit | Attending: Family Medicine | Admitting: Family Medicine

## 2019-12-11 DIAGNOSIS — Z1231 Encounter for screening mammogram for malignant neoplasm of breast: Secondary | ICD-10-CM

## 2019-12-14 NOTE — Progress Notes (Signed)
Patient notified of results & recommendations. Expressed understanding.  Scheduled a 3 month appointment with provider.

## 2019-12-18 ENCOUNTER — Other Ambulatory Visit: Payer: Self-pay

## 2019-12-18 ENCOUNTER — Encounter: Payer: Self-pay | Admitting: Pharmacist

## 2019-12-18 ENCOUNTER — Ambulatory Visit: Payer: Medicare Other | Attending: Internal Medicine | Admitting: Pharmacist

## 2019-12-18 DIAGNOSIS — Z794 Long term (current) use of insulin: Secondary | ICD-10-CM | POA: Diagnosis not present

## 2019-12-18 DIAGNOSIS — E1165 Type 2 diabetes mellitus with hyperglycemia: Secondary | ICD-10-CM | POA: Diagnosis not present

## 2019-12-18 DIAGNOSIS — E1159 Type 2 diabetes mellitus with other circulatory complications: Secondary | ICD-10-CM

## 2019-12-18 NOTE — Progress Notes (Signed)
S:    PCP: Dr. Juleen China   Patient arrives in good spirits. Presents for diabetes evaluation, education, and management Patient was referred and last seen by Primary Care Provider on 12/07/2019.    Patient has an extensive cardiac hx: anterior wall MI d/t LAD stenosis with formation of left vent apical aneurysm, chronic combined systolic and diastolic HF (most recent EF estimated at 50% per Cardio note), ICD implantation, on chronic warfarin, HTN, and HLD. Followed by Cardiology and appears to be doing well.   Additionally, she has a history of CKD with most recent creatinine 1.22 (12/09/2019). Referral for nephrology placed by PCP at last PCP visit.   In regards to her DM, patient reports this was diagnosed in 2014. She was not placed on insulin at the time of dx, per patient. Reports being on several oral agents prior to insulin. No history of pancreatitis. No personal or family history of thyroid cancer. Was unable to tolerate metformin and Januvia d/t nausea. Has been on an SGLT-2 inhibitor Wilder Glade) but reports she was unable to tolerate d/t urinary frequency. Has never tried a GLP-1 RA before, but reports that her nephew is on Trulicity and that she is familiar with this drug class.     Family/Social History:  - FHx: DM, stroke, CAD - Tobacco: former smoker (quit in 1965) - Alcohol: denies current use   Insurance coverage/medication affordability: [employer]  Patient reports adherence with medications.  Current diabetes medications include: Humalog 50/50 Kwikpen 30 units BID, Humalog 10 units TID with meals  Current hypertension medications include: amlodipine 5 mg daily, carvedilol 25 mg BID (unable to tolerate ACE/ARB d/t cough in the past) Current hyperlipidemia medications include: atorvastatin 80 mg daily   Patient reports occasional hypoglycemic events. Sugars as low as 68 if she goes a prolonged time without eating.   Patient reported dietary habits: - Dietary  non-compliant  - Admits to struggling with carbs and sweets - Admits to consumption of regular Mt. Dew  Patient-reported exercise habits:  - Has drastically improved  - Reports walking the dog for a total of 1 hour daily    Patient denies polyuria, polydipsia.  Patient reports baseline neuropathy.  Patient denies visual changes. Patient reports self foot exams.     O:  POCT: 169 (ate grits and eggs ~ 1.5 hours ago)  Lab Results  Component Value Date   HGBA1C 9.6 (H) 12/09/2019   There were no vitals filed for this visit.  Lipid Panel     Component Value Date/Time   CHOL 129 03/11/2019 1053   TRIG 124 03/11/2019 1053   HDL 37 (L) 03/11/2019 1053   CHOLHDL 3.5 03/11/2019 1053   LDLCALC 67 03/11/2019 1053    Home fasting blood sugars: reports 130s-140s  2 hour post-meal/random blood sugars: higher 180s-200s. (occasional hypoglycemia if she takes her morning insulin and does not give enough carb coverage)  Clinical Atherosclerotic Cardiovascular Disease (ASCVD): Yes  The ASCVD Risk score Mikey Bussing DC Jr., et al., 2013) failed to calculate for the following reasons:   The patient has a prior MI or stroke diagnosis    A/P: Diabetes longstanding currently uncontrolled. Patient is able to verbalize appropriate hypoglycemia management plan. Patient is adherent with medication. Control is suboptimal due to dietary indiscretion.  With her cardiac hx, I would prefer to start Victoza. Victoza has ASCVD benefit data and many patients studied who benefited from [place] had established cardiovascular disease. Additionally, using Victoza may decrease the amount of  insulin that the patient has to take. Vania Rea has good data for ASCVD benefit, benefit in HF patients, and benefit in patients with CKD. However, patient has tried [place] before and could not tolerate this d/t frequent urination. I offered these to the patient today, however, she wishes to make some changes in her diet before  making medication adjustments. She will return in two weeks for reassessment after making changes in her diet.   -Continued current regimen for now.  -Extensively discussed pathophysiology of diabetes, recommended lifestyle interventions, dietary effects on blood sugar control -Counseled on s/sx of and management of hypoglycemia -Next A1C anticipated 02/2020.   ASCVD risk - secondary prevention in patient with diabetes. Last LDL is controlled on atorvastatin 80 mg. -Continued atorvastatin 80 mg.   HM: Due for Pneumovax. Will address at follow-up.  Written patient instructions provided.  Total time in face to face counseling 30 minutes.   Follow up Pharmacist Clinic Visit in 2-3 weeks.   Benard Halsted, PharmD, [place] [phone]

## 2019-12-21 ENCOUNTER — Ambulatory Visit (INDEPENDENT_AMBULATORY_CARE_PROVIDER_SITE_OTHER): Payer: Medicare Other | Admitting: Pharmacist

## 2019-12-21 ENCOUNTER — Other Ambulatory Visit: Payer: Self-pay

## 2019-12-21 DIAGNOSIS — Z7901 Long term (current) use of anticoagulants: Secondary | ICD-10-CM

## 2019-12-21 DIAGNOSIS — I253 Aneurysm of heart: Secondary | ICD-10-CM | POA: Diagnosis not present

## 2019-12-21 LAB — POCT INR: INR: 2.3 (ref 2.0–3.0)

## 2019-12-31 ENCOUNTER — Other Ambulatory Visit: Payer: Self-pay

## 2019-12-31 DIAGNOSIS — E1159 Type 2 diabetes mellitus with other circulatory complications: Secondary | ICD-10-CM

## 2019-12-31 MED ORDER — ONETOUCH VERIO W/DEVICE KIT: PACK | 0 refills | Status: AC

## 2019-12-31 MED ORDER — ONETOUCH DELICA LANCING DEV MISC: 0 refills | Status: AC

## 2019-12-31 MED ORDER — ONETOUCH VERIO VI STRP: ORAL_STRIP | 2 refills | Status: AC

## 2019-12-31 MED ORDER — ONETOUCH DELICA LANCETS 33G MISC: 2 refills | Status: DC

## 2020-01-01 ENCOUNTER — Encounter: Payer: Self-pay | Admitting: Pharmacist

## 2020-01-01 ENCOUNTER — Ambulatory Visit: Payer: Medicare Other | Attending: Family Medicine | Admitting: Pharmacist

## 2020-01-01 ENCOUNTER — Other Ambulatory Visit: Payer: Self-pay

## 2020-01-01 DIAGNOSIS — Z794 Long term (current) use of insulin: Secondary | ICD-10-CM

## 2020-01-01 DIAGNOSIS — E1159 Type 2 diabetes mellitus with other circulatory complications: Secondary | ICD-10-CM

## 2020-01-01 NOTE — Progress Notes (Signed)
     S:    PCP: Dr. Juleen China   Patient arrives in good spirits. Presents for diabetes evaluation, education, and management Patient was referred and last seen by Primary Care Provider on 12/07/2019.  I saw her on 12/18/2019 for DM teaching and follow-up. We made no changes to her medication regimen.    Family/Social History:  - FHx: DM, stroke, CAD - Tobacco: former smoker (quit in 1965) - Alcohol: denies current use   Insurance coverage/medication affordability: [employer]  Patient reports adherence with medications.  Current diabetes medications include: Humalog 50/50 Kwikpen 30 units BID, Humalog 10 units TID with meals  Current hypertension medications include: amlodipine 5 mg daily, carvedilol 25 mg BID (unable to tolerate ACE/ARB d/t cough in the past) Current hyperlipidemia medications include: atorvastatin 80 mg daily   Patient denies hypoglycemic events. Did have sugar drop < 70 when she went for a prolonged time without eating.   Patient reported dietary habits: - Has implemented MyPlate since last visit - Has eliminated sugars and sodas over the past 2 weeks  Patient-reported exercise habits:  - Has drastically improved  - Reports walking the dog for a total of 1 hour daily    Patient denies polyuria, polydipsia.  Patient reports baseline neuropathy.  Patient denies visual changes. Patient reports self foot exams.     O:  POCT: 195 (ate an egg about 1.5 hrs ago)  Lab Results  Component Value Date   HGBA1C 9.6 (H) 12/09/2019   There were no vitals filed for this visit.  Lipid Panel     Component Value Date/Time   CHOL 129 03/11/2019 1053   TRIG 124 03/11/2019 1053   HDL 37 (L) 03/11/2019 1053   CHOLHDL 3.5 03/11/2019 1053   LDLCALC 67 03/11/2019 1053   Home cbgs: Fasting: 97 - 129 (2 outliers 180, 173) Afternoon/random: 123 - 158  Clinical Atherosclerotic Cardiovascular Disease (ASCVD): Yes  The ASCVD Risk score Mikey Bussing DC Jr., et al., 2013)  failed to calculate for the following reasons:   The patient has a prior MI or stroke diagnosis    A/P: Diabetes longstanding currently uncontrolled. Patient is able to verbalize appropriate hypoglycemia management plan. Patient is adherent with medication. Control has improved with improved diet. Her sugars are now mostly at goal.   With her cardiac hx, I still think she would benefit from Victoza. We will talk about this more at follow-up. She wishes to continue with diet and her current insulin regimen. I will see her again in 2 weeks.  -Continued current regimen for now.  -Extensively discussed pathophysiology of diabetes, recommended lifestyle interventions, dietary effects on blood sugar control -Counseled on s/sx of and management of hypoglycemia -Next A1C anticipated 02/2020.   ASCVD risk - secondary prevention in patient with diabetes. Last LDL is controlled on atorvastatin 80 mg. -Continued atorvastatin 80 mg.   HM: Due for Pneumovax. Will address at follow-up.  Written patient instructions provided.  Total time in face to face counseling 30 minutes.   Follow up Pharmacist Clinic Visit in 1 month.   Benard Halsted, PharmD, [place] [phone]

## 2020-01-09 ENCOUNTER — Other Ambulatory Visit: Payer: Self-pay | Admitting: Family Medicine

## 2020-02-01 ENCOUNTER — Other Ambulatory Visit: Payer: Self-pay

## 2020-02-01 ENCOUNTER — Ambulatory Visit: Payer: Medicare Other | Attending: Internal Medicine | Admitting: Pharmacist

## 2020-02-01 DIAGNOSIS — E1159 Type 2 diabetes mellitus with other circulatory complications: Secondary | ICD-10-CM

## 2020-02-01 DIAGNOSIS — Z794 Long term (current) use of insulin: Secondary | ICD-10-CM

## 2020-02-01 NOTE — Progress Notes (Signed)
     S:    PCP: Dr. Juleen China   Patient arrives in good spirits. Presents for diabetes evaluation, education, and management Patient was referred and last seen by Primary Care Provider on 12/07/2019.  I saw her on 01/01/2020 for DM management.   Family/Social History:  - FHx: DM, stroke, CAD - Tobacco: former smoker (quit in 1965) - Alcohol: denies current use   Insurance coverage/medication affordability: [employer]  Patient reports adherence with medications.  Current diabetes medications include: Humalog 50/50 Kwikpen 30 units BID, Humalog 10 units TID with meals  Current hypertension medications include: amlodipine 5 mg daily, carvedilol 25 mg BID (unable to tolerate ACE/ARB d/t cough in the past) Current hyperlipidemia medications include: atorvastatin 80 mg daily   Patient denies hypoglycemic events.   Patient reported dietary habits: - Has implemented MyPlate since last visit - Has eliminated sugars and sodas over the past 2 weeks  Patient-reported exercise habits:  - Has drastically improved  - Reports walking the dog for a total of 1 hour daily    Patient denies polyuria, polydipsia.  Patient reports baseline neuropathy.  Patient denies visual changes. Patient reports self foot exams.     O:  POCT: 101  Lab Results  Component Value Date   HGBA1C 9.6 (H) 12/09/2019   There were no vitals filed for this visit.  Lipid Panel     Component Value Date/Time   CHOL 129 03/11/2019 1053   TRIG 124 03/11/2019 1053   HDL 37 (L) 03/11/2019 1053   CHOLHDL 3.5 03/11/2019 1053   LDLCALC 67 03/11/2019 1053   Home cbgs: Fasting: low 100s-120s Afternoon/random: reports mostly 130s  Clinical Atherosclerotic Cardiovascular Disease (ASCVD): Yes  The ASCVD Risk score Mikey Bussing DC Jr., et al., 2013) failed to calculate for the following reasons:   The patient has a prior MI or stroke diagnosis    A/P: Diabetes longstanding currently uncontrolled. Patient is able to  verbalize appropriate hypoglycemia management plan. Patient is adherent with medication. Control has improved with improved diet. Her sugars are now mostly at goal.   With her cardiac hx, I still think she would benefit from Victoza. Discussed with her the possibility of Victoza decreasing her need for exogenous insulin. Specifically, we discussed the possibility of decreasing the dose of or eliminating meal time Humalog once we start Victoza. She wants to think about this until she sees her PCP again in July.  -Continued current regimen for now.  -Extensively discussed pathophysiology of diabetes, recommended lifestyle interventions, dietary effects on blood sugar control -Counseled on s/sx of and management of hypoglycemia -Next A1C anticipated 02/2020.    Written patient instructions provided.  Total time in face to face counseling 30 minutes.   Follow up PCP Clinic Visit in July.   Benard Halsted, PharmD, [place] [phone]

## 2020-02-11 ENCOUNTER — Other Ambulatory Visit: Payer: Self-pay | Admitting: Physician Assistant

## 2020-02-13 ENCOUNTER — Other Ambulatory Visit: Payer: Self-pay | Admitting: Family Medicine

## 2020-02-13 NOTE — Telephone Encounter (Signed)
Prescription for DR. Juleen China

## 2020-02-15 ENCOUNTER — Other Ambulatory Visit: Payer: Self-pay

## 2020-02-15 ENCOUNTER — Ambulatory Visit (INDEPENDENT_AMBULATORY_CARE_PROVIDER_SITE_OTHER): Payer: Medicare Other | Admitting: Pharmacist

## 2020-02-15 DIAGNOSIS — Z7901 Long term (current) use of anticoagulants: Secondary | ICD-10-CM

## 2020-02-15 DIAGNOSIS — I253 Aneurysm of heart: Secondary | ICD-10-CM

## 2020-02-15 LAB — POCT INR: INR: 2.1 (ref 2.0–3.0)

## 2020-02-18 ENCOUNTER — Ambulatory Visit (INDEPENDENT_AMBULATORY_CARE_PROVIDER_SITE_OTHER): Payer: Medicare Other | Admitting: *Deleted

## 2020-02-18 ENCOUNTER — Ambulatory Visit
Admission: EM | Admit: 2020-02-18 | Discharge: 2020-02-18 | Disposition: A | Discharge status: Home / Self Care | Payer: Medicare Other | Attending: Physician Assistant | Admitting: Physician Assistant

## 2020-02-18 DIAGNOSIS — M79641 Pain in right hand: Secondary | ICD-10-CM | POA: Diagnosis not present

## 2020-02-18 DIAGNOSIS — I5042 Chronic combined systolic (congestive) and diastolic (congestive) heart failure: Secondary | ICD-10-CM

## 2020-02-18 DIAGNOSIS — M79644 Pain in right finger(s): Secondary | ICD-10-CM

## 2020-02-18 HISTORY — DX: Gout, unspecified: M10.9

## 2020-02-18 LAB — CUP PACEART REMOTE DEVICE CHECK
Battery Remaining Longevity: 144 mo
Battery Remaining Percentage: 100 %
Brady Statistic RV Percent Paced: 0 %
Date Time Interrogation Session: 20210610021100
HighPow Impedance: 69 Ohm
Implantable Pulse Generator Implant Date: 20170217
Lead Channel Impedance Value: 907 Ohm
Lead Channel Pacing Threshold Amplitude: 1 V
Lead Channel Pacing Threshold Pulse Width: 0.4 ms
Lead Channel Setting Pacing Amplitude: 2 V
Lead Channel Setting Pacing Pulse Width: 0.4 ms
Lead Channel Setting Sensing Sensitivity: 0.5 mV
Pulse Gen Serial Number: 213123

## 2020-02-18 MED ORDER — DICLOFENAC SODIUM 1 % EX GEL: 2.0000 g | Freq: Four times a day (QID) | CUTANEOUS | 0 refills | Status: DC

## 2020-02-18 MED ORDER — TRAMADOL HCL 50 MG PO TABS: 50.0000 mg | ORAL_TABLET | Freq: Three times a day (TID) | ORAL | 0 refills | Status: DC | PRN

## 2020-02-18 NOTE — Discharge Instructions (Signed)
This is likely due to inflammation. Voltaren gel, ice compress, finger splint during activity. If symptoms not improving, tramadol as needed for pain. Follow up with sports medicine/orthopedics if symptoms does not resolve for further evaluation needed.

## 2020-02-18 NOTE — Progress Notes (Signed)
Remote ICD transmission.   

## 2020-02-18 NOTE — ED Triage Notes (Signed)
Pt c/o rt hand pain x2wks. Denies injury. States swelling to top and having throbbing pain to palm of hand. States her rt ring finger locks up sometimes

## 2020-02-18 NOTE — ED Provider Notes (Signed)
[facility]    CSN: [id number] Arrival date & time: 02/18/20  [place]      History   Chief Complaint Chief Complaint  Patient presents with  . Hand Pain    HPI Desiree Harrison is a 74 y.o. female.   74 year old female with history of CAD on warfarin, DM, HLD, comes in for 2 week history of right hand pain. Denies injury/trauma. Pain is to the palmer aspect of the hand, worse to right ring finger. Denies numbness/tingling. Pain not worse with movement.      Past Medical History:  Diagnosis Date  . Acute systolic heart failure ([place])   . CAD in native artery   . Cardiomyopathy ([place])   . Gout   . Long term (current) use of anticoagulants   . S/P internal cardiac defibrillator procedure    placed 10/28/15  . STEMI (ST elevation myocardial infarction) ([place])   . Type 2 diabetes mellitus, with long-term current use of insulin ([place])   . Ventricular aneurysm     Patient Active Problem List   Diagnosis Date Noted  . Stage 3a chronic kidney disease 12/10/2019  . Pneumonia 01/18/2019  . CAP (community acquired pneumonia) 01/17/2019  . Sepsis due to pneumonia ([place]) 01/17/2019  . DOE (dyspnea on exertion) 01/12/2019  . Upper airway cough syndrome 12/30/2018  . Pulmonary infiltrates on CXR 12/30/2018  . Long term (current) use of anticoagulants 08/27/2018  . Diabetes mellitus ([place]) 08/22/2018  . Essential hypertension 08/22/2018  . Ventricular aneurysm 08/22/2018  . Long term current use of anticoagulant therapy 08/22/2018  . Hypothyroidism 08/22/2018  . Hyperlipidemia 08/22/2018  . ICD (implantable cardioverter-defibrillator) in place 08/22/2018  . Chronic combined systolic and diastolic heart failure ([place]) 08/21/2018  . CAD in native artery 08/25/2015    Past Surgical History:  Procedure Laterality Date  . ABDOMINAL HYSTERECTOMY    . APPENDECTOMY    . BREAST BIOPSY Left   . CARDIAC CATHETERIZATION    . CHOLECYSTECTOMY    . VIDEO BRONCHOSCOPY WITH  ENDOBRONCHIAL ULTRASOUND N/A 01/20/2019   Procedure: VIDEO BRONCHOSCOPY WITH BIOPSY;  Surgeon: Marshell Garfinkel, MD;  Location: [facility];  Service: Pulmonary;  Laterality: N/A;    OB History   No obstetric history on file.      Home Medications    Prior to Admission medications   Medication Sig Start Date End Date Taking? Authorizing Provider  amLODipine (NORVASC) 5 MG tablet Take 1 tablet (5 mg total) by mouth daily. 01/30/19   Almyra Deforest, PA  aspirin EC 81 MG tablet Take 81 mg by mouth daily.    [provider]  atorvastatin (LIPITOR) 80 MG tablet TAKE 1 TABLET BY MOUTH EVERY DAY 02/11/20   Almyra Deforest, PA  Blood Glucose Monitoring Suppl (ONETOUCH VERIO) w/Device KIT Use to check FSBS three times a day. Dx: E11.59, Z79.4 12/31/19   Nicolette Bang, DO  carvedilol (COREG) 25 MG tablet Take 1 tablet (25 mg total) by mouth 2 (two) times daily with a meal. 04/17/19   Croitoru, Mihai, MD  diclofenac Sodium (VOLTAREN) 1 % GEL Apply 2 g topically 4 (four) times daily. 02/18/20   Tasia Catchings, Tanzie Rothschild V, PA-C  gabapentin (NEURONTIN) 100 MG capsule Take 2 capsules (200 mg total) by mouth 2 (two) times daily. 12/07/19   Nicolette Bang, DO  glucose blood (ONETOUCH VERIO) test strip Use to check FSBS three times a day. Dx: E11.59, Z79.4 12/31/19   Nicolette Bang, DO  HUMALOG [place]  50/50 KWIKPEN (50-50) 100 UNIT/ML Kwikpen Inject 30 Units into the skin 2 (two) times daily. Dx: E11.59, Z79.4 12/07/19   Nicolette Bang, DO  insulin lispro (HUMALOG) 100 UNIT/ML injection Inject 0.1 mLs (10 Units total) into the skin 3 (three) times daily before meals. 12/07/19   Nicolette Bang, DO  Insulin Pen Needle (BD PEN NEEDLE MICRO U/F) 32G X 6 MM MISC Use twice a day with Humalog Mix Kwikpen. Dx: E11.59, Z79.4 02/16/20   Nicolette Bang, DO  Insulin Syringe-Needle U-100 (INSULIN SYRINGE .3CC/31GX5/16") 31G X 5/16" 0.3 ML MISC Use TID to inject Humalog insulin. Dx: E11.59, Z79.4  12/09/19   Nicolette Bang, DO  Lancet Devices (ONE TOUCH DELICA LANCING DEV) MISC Use to check FSBS three times a day. Dx: E11.59, Z79.4 12/31/19   Nicolette Bang, DO  levothyroxine (SYNTHROID) 50 MCG tablet TAKE 1 TABLET BY MOUTH EVERY DAY 01/11/20   Nicolette Bang, DO  omeprazole (PRILOSEC) 20 MG capsule Take 30- 60 min before your first and last meals of the day 12/30/18   Tanda Rockers, MD  OneTouch Delica Lancets 12W MISC Use to check FSBS three times a day. Dx: E11.59, Z79.4 12/31/19   Nicolette Bang, DO  traMADol (ULTRAM) 50 MG tablet Take 1 tablet (50 mg total) by mouth 3 (three) times daily as needed. 02/18/20   Tasia Catchings, Shawndale Kilpatrick V, PA-C  warfarin (COUMADIN) 3 MG tablet TAKE 1 TO 1.5 TABLETS BY MOUTH DAILY AS DIRECTED BY COUMADIN CLINIC 11/12/19   Croitoru, Dani Gobble, MD    Family History Family History  Problem Relation Age of Onset  . Breast cancer Mother   . Coronary artery disease Mother   . Diabetes Mother   . Stroke Father     Social History Social History   Tobacco Use  . Smoking status: Former Smoker    Packs/day: 0.25    Types: Cigarettes    Quit date: 12/30/1963    Years since quitting: 56.1  . Smokeless tobacco: Never Used  . Tobacco comment: pt smoked 5-6/cigs daily 12/30/2018  Substance Use Topics  . Alcohol use: Not Currently  . Drug use: Never     Allergies   Lisinopril   Review of Systems Review of Systems  Reason unable to perform ROS: See HPI as above.     Physical Exam Triage Vital Signs ED Triage Vitals [02/18/20 1659]  Enc Vitals Group     BP (!) 161/82     Pulse Rate 63     Resp 18     Temp 97.9 F (36.6 C)     Temp Source Oral     SpO2 94 %     Weight      Height      Head Circumference      Peak Flow      Pain Score      Pain Loc      Pain Edu?      Excl. in [place]?    No data found.  Updated Vital Signs BP (!) 161/82 (BP Location: Left Arm)   Pulse 63   Temp 97.9 F (36.6 C) (Oral)   Resp 18    SpO2 94%   Visual Acuity Right Eye Distance:   Left Eye Distance:   Bilateral Distance:    Right Eye Near:   Left Eye Near:    Bilateral Near:     Physical Exam Constitutional:      General: She is not in  acute distress.    Appearance: Normal appearance. She is well-developed. She is not toxic-appearing or diaphoretic.  HENT:     Head: Normocephalic and atraumatic.  Eyes:     Conjunctiva/sclera: Conjunctivae normal.     Pupils: Pupils are equal, round, and reactive to light.  Pulmonary:     Effort: Pulmonary effort is normal. No respiratory distress.     Comments: Speaking in full sentences without difficulty Musculoskeletal:     Cervical back: Normal range of motion and neck supple.     Comments: No obvious swelling, erythema, warmth, contusion. Tenderness to palpation along 2nd-4th MCP. Full ROM of wrist. Decreased flexion of MCP joints. Strength 5/5. Sensation intact. Radial pulse 2+, cap refill <2s  Skin:    General: Skin is warm and dry.  Neurological:     Mental Status: She is alert and oriented to person, place, and time.      UC Treatments / Results  Labs (all labs ordered are listed, but only abnormal results are displayed) Labs Reviewed - No data to display  EKG   Radiology   Procedures Procedures (including critical care time)  Medications Ordered in UC Medications - No data to display  Initial Impression / Assessment and Plan / UC Course  I have reviewed the triage vital signs and the nursing notes.  Pertinent labs & imaging results that were available during my care of the patient were reviewed by me and considered in my medical decision making (see chart for details).     Likely inflammatory in nature. However, patient unable to take NSAIDs due to warfarin use, and would like to avoid prednisone due to uncontrolled DM. Will have patient try a voltaren gel, ice compress. Will apply finger splint to 4th finger. Tramadol if symptoms not improving to  help with pain. Return precautions given.  Final Clinical Impressions(s) / UC Diagnoses   Final diagnoses:  Right hand pain  Finger pain, right    ED Prescriptions    Medication Sig Dispense Auth. Provider   diclofenac Sodium (VOLTAREN) 1 % GEL Apply 2 g topically 4 (four) times daily. 100 g Jendayi Berling V, PA-C   traMADol (ULTRAM) 50 MG tablet Take 1 tablet (50 mg total) by mouth 3 (three) times daily as needed. 15 tablet Ok Edwards, PA-C     I have reviewed the PDMP during this encounter.   Ok Edwards, PA-C 02/18/20 2156

## 2020-02-29 ENCOUNTER — Other Ambulatory Visit: Payer: Self-pay | Admitting: Cardiovascular Disease

## 2020-03-10 ENCOUNTER — Encounter: Payer: Self-pay | Admitting: Internal Medicine

## 2020-03-10 ENCOUNTER — Other Ambulatory Visit: Payer: Self-pay

## 2020-03-10 ENCOUNTER — Ambulatory Visit (INDEPENDENT_AMBULATORY_CARE_PROVIDER_SITE_OTHER): Payer: Medicare Other | Admitting: Internal Medicine

## 2020-03-10 VITALS — BP 95/62 | HR 64 | Temp 97.5°F | Resp 17 | Wt 235.0 lb

## 2020-03-10 DIAGNOSIS — Z794 Long term (current) use of insulin: Secondary | ICD-10-CM | POA: Diagnosis not present

## 2020-03-10 DIAGNOSIS — I10 Essential (primary) hypertension: Secondary | ICD-10-CM

## 2020-03-10 DIAGNOSIS — E782 Mixed hyperlipidemia: Secondary | ICD-10-CM

## 2020-03-10 DIAGNOSIS — E1159 Type 2 diabetes mellitus with other circulatory complications: Secondary | ICD-10-CM | POA: Diagnosis not present

## 2020-03-10 DIAGNOSIS — Z1211 Encounter for screening for malignant neoplasm of colon: Secondary | ICD-10-CM | POA: Diagnosis not present

## 2020-03-10 LAB — GLUCOSE, POCT (MANUAL RESULT ENTRY): POC Glucose: 103 mg/dl — AB (ref 70–99)

## 2020-03-10 LAB — POCT GLYCOSYLATED HEMOGLOBIN (HGB A1C): HbA1c POC (<> result, manual entry): 6.6 % (ref 4.0–5.6)

## 2020-03-10 MED ORDER — HUMALOG MIX 50/50 KWIKPEN (50-50) 100 UNIT/ML ~~LOC~~ SUPN: 26.0000 [IU] | PEN_INJECTOR | Freq: Two times a day (BID) | SUBCUTANEOUS | 5 refills | Status: DC

## 2020-03-10 MED ORDER — INSULIN LISPRO 100 UNIT/ML ~~LOC~~ SOLN: 8.0000 [IU] | Freq: Three times a day (TID) | SUBCUTANEOUS | 5 refills | Status: DC

## 2020-03-10 NOTE — Progress Notes (Signed)
Subjective:    Desiree Harrison - 74 y.o. female MRN [id number]  Date of birth: 1946-03-26  HPI  Desiree Harrison is here for chronic medical condition f/u.  Chronic HTN Disease Monitoring:  Home BP Monitoring - 123/72 is avg per patient  Chest pain- no  Dyspnea- no Headache - no  Medications: Amlodipine 5 mg, Coreg 25 mg BID Compliance- yes Lightheadedness- no  Edema- no   Diabetes mellitus, Type 2 Reports CBGs have been running really wells since she started watching more of her carb intake per meal.  Disease Monitoring             Blood Sugar Ranges: Fasting - 105-120             Polyuria: no              Visual problems: no   Urine Microalbumin elevated nov 2020   Last A1C: 9.6 (12/09/19)   Medications: Humalog 50/50 mix 30u BID, Humalog 10u TID  Medication Compliance: yes  Medication Side Effects             Hypoglycemia: yes, happens once or twice per week where she gets CBGs in 50s during middle of the day     Health Maintenance:  Health Maintenance Due  Topic Date Due  . COVID-19 Vaccine (1) Never done  . COLONOSCOPY  Never done  . OPHTHALMOLOGY EXAM  01/07/2019  . PNA vac Low Risk Adult (2 of 2 - PPSV23) 10/17/2019    -  reports that she quit smoking about 56 years ago. Her smoking use included cigarettes. She smoked 0.25 packs per day. She has never used smokeless tobacco. - Review of Systems: Per HPI. - Past Medical History: Patient Active Problem List   Diagnosis Date Noted  . Stage 3a chronic kidney disease 12/10/2019  . Pneumonia 01/18/2019  . CAP (community acquired pneumonia) 01/17/2019  . Sepsis due to pneumonia ([place]) 01/17/2019  . DOE (dyspnea on exertion) 01/12/2019  . Upper airway cough syndrome 12/30/2018  . Pulmonary infiltrates on CXR 12/30/2018  . Long term (current) use of anticoagulants 08/27/2018  . Diabetes mellitus ([place]) 08/22/2018  . Essential hypertension 08/22/2018  . Ventricular aneurysm 08/22/2018  . Long term  current use of anticoagulant therapy 08/22/2018  . Hypothyroidism 08/22/2018  . Hyperlipidemia 08/22/2018  . ICD (implantable cardioverter-defibrillator) in place 08/22/2018  . Chronic combined systolic and diastolic heart failure ([place]) 08/21/2018  . CAD in native artery 08/25/2015   - Medications: reviewed and updated   Objective:   Physical Exam BP 95/62   Pulse 64   Temp (!) 97.5 F (36.4 C) (Temporal)   Resp 17   Wt 235 lb (106.6 kg)   SpO2 94%   BMI 36.81 kg/m  Physical Exam Constitutional:      General: She is not in acute distress.    Appearance: She is not diaphoretic.  Cardiovascular:     Rate and Rhythm: Normal rate.  Pulmonary:     Effort: Pulmonary effort is normal. No respiratory distress.  Musculoskeletal:        General: Normal range of motion.  Skin:    General: Skin is warm and dry.  Neurological:     Mental Status: She is alert and oriented to person, place, and time.  Psychiatric:        Mood and Affect: Affect normal.        Judgment: Judgment normal.      Assessment & Plan:    1. Type  2 diabetes mellitus with other circulatory complication, with long-term current use of insulin (HCC) A1c with significant improvement from 9.6>6.6 in 3 months with diet changes alone. Given age and goal of A1c 7-8 as well as episodes of hypoglycemia, will reduce insulin dose. Humalog 50/50 decrease to 26u BID and Humalog TID with meals decrease to 8u TID. Discussed that if continues with the diet adherence may reduce further.  Counseled on Diabetic diet, my plate method, 888 minutes of moderate intensity exercise/week Blood sugar logs with fasting goals of 80-120 mg/dl, random of less than 180 and in the event of sugars less than 60 mg/dl or greater than 400 mg/dl encouraged to notify the clinic. Advised on the need for annual eye exams, annual foot exams, Pneumonia vaccine. - Glucose (CBG) - HgB A1c - Lipid Panel - HUMALOG [place] 50/50 KWIKPEN (50-50) 100 UNIT/ML  Kwikpen; Inject 26 Units into the skin 2 (two) times daily. Dx: E11.59, Z79.4  Dispense: 15 mL; Refill: 5 - insulin lispro (HUMALOG) 100 UNIT/ML injection; Inject 0.08 mLs (8 Units total) into the skin 3 (three) times daily before meals.  Dispense: 10 mL; Refill: 5  2. Essential hypertension BPs at goal at home. Slightly hypotensive today but asymptomatic. Will continue to monitor with her recent diet changes to see if would benefit from dose decrease of her medications. For now continue current therapy.  - Lipid Panel - CBC with Differential  3. Mixed hyperlipidemia Last LDL one year ago at goal. Repeat today. Compliant with Lipitor.  - Lipid Panel  4. Colon cancer screening - Ambulatory referral to Gastroenterology    Phill Myron, D.O. 03/10/2020, 9:40 AM Primary Care at [facility]

## 2020-03-10 NOTE — Patient Instructions (Signed)
Humalog 50/50 mix decrease to 26 units twice per day and regular insulin take 8 units three times per day. Let me know if you are routinely having blood sugars less than 60 or higher than 200.

## 2020-03-11 LAB — CBC WITH DIFFERENTIAL/PLATELET
Basophils Absolute: 0 10*3/uL (ref 0.0–0.2)
Basos: 1 %
EOS (ABSOLUTE): 0.1 10*3/uL (ref 0.0–0.4)
Eos: 2 %
Hematocrit: 45.7 % (ref 34.0–46.6)
Hemoglobin: 14.7 g/dL (ref 11.1–15.9)
Immature Grans (Abs): 0 10*3/uL (ref 0.0–0.1)
Immature Granulocytes: 0 %
Lymphocytes Absolute: 2.3 10*3/uL (ref 0.7–3.1)
Lymphs: 38 %
MCH: 25.8 pg — ABNORMAL LOW (ref 26.6–33.0)
MCHC: 32.2 g/dL (ref 31.5–35.7)
MCV: 80 fL (ref 79–97)
Monocytes Absolute: 0.6 10*3/uL (ref 0.1–0.9)
Monocytes: 10 %
Neutrophils Absolute: 3 10*3/uL (ref 1.4–7.0)
Neutrophils: 49 %
Platelets: 204 10*3/uL (ref 150–450)
RBC: 5.69 x10E6/uL — ABNORMAL HIGH (ref 3.77–5.28)
RDW: 14.9 % (ref 11.7–15.4)
WBC: 6.1 10*3/uL (ref 3.4–10.8)

## 2020-03-11 LAB — LIPID PANEL
Chol/HDL Ratio: 3.4 ratio (ref 0.0–4.4)
Cholesterol, Total: 104 mg/dL (ref 100–199)
HDL: 31 mg/dL — ABNORMAL LOW (ref >39)
LDL Chol Calc (NIH): 50 mg/dL (ref 0–99)
Triglycerides: 125 mg/dL (ref 0–149)
VLDL Cholesterol Cal: 23 mg/dL (ref 5–40)

## 2020-03-11 NOTE — Progress Notes (Signed)
Patient notified of results & recommendations via [place].

## 2020-03-24 ENCOUNTER — Other Ambulatory Visit: Payer: Self-pay | Admitting: Physician Assistant

## 2020-03-30 ENCOUNTER — Other Ambulatory Visit: Payer: Self-pay

## 2020-03-30 ENCOUNTER — Ambulatory Visit: Payer: Medicare Other | Admitting: Family Medicine

## 2020-03-30 VITALS — BP 130/92 | Ht 68.0 in | Wt 220.0 lb

## 2020-03-30 DIAGNOSIS — M6530 Trigger finger, unspecified finger: Secondary | ICD-10-CM

## 2020-03-30 MED ORDER — METHYLPREDNISOLONE ACETATE 40 MG/ML IJ SUSP
20.0000 mg | Freq: Once | INTRAMUSCULAR | Status: AC
  Administered 2020-03-30: 20 mg via INTRA_ARTICULAR

## 2020-03-30 NOTE — Patient Instructions (Signed)
You have been diagnosed with Trigger finger, a condition in which the tendon in your finger swells and becomes painful, and gets caught. You received a steroid injection today which should help with this.  -Please continue to use band-aids to keep your finger straight for the next ~1-2 weeks, then mobilize as tolerated.  -please follow up in 3-4 weeks to reevaluate

## 2020-03-30 NOTE — Progress Notes (Signed)
PCP: Nicolette Bang, DO  Subjective:   HPI: Patient is a 74 y.o. female here for evaluation of right hand pain and finger locking.  She reports that over the last 2 to 3 months, she has had progressively worsening pain specifically in her fourth digit and extending into her palm.  This pain is most severe when she is flexing and extending her fourth digit, and she has also noted over the last few weeks that she has had some mechanical locking of her fourth digit while it is in flexion.  She occasionally has to use her opposite hand to straighten her finger.  The pain in her hand is now sometimes her whole palm and she is noticing some locking of the fifth digit.  She has not had any new trauma or injury to her hand.  She does have a dog that she got about a year ago and tends to use her right hand to walk it, otherwise no new activities.  Patient went to the ER on 02/18/2020, at that time was thought to have inflammation of her hand and was placed in a splint to place her finger in extension.  She uses for a brief time but found it uncomfortable and stopped using it.  Past Medical History:  Diagnosis Date  . Acute systolic heart failure ([place])   . CAD in native artery   . Cardiomyopathy ([place])   . Gout   . Long term (current) use of anticoagulants   . S/P internal cardiac defibrillator procedure    placed 10/28/15  . STEMI (ST elevation myocardial infarction) ([place])   . Type 2 diabetes mellitus, with long-term current use of insulin ([place])   . Ventricular aneurysm     Current Outpatient Medications on File Prior to Visit  Medication Sig Dispense Refill  . amLODipine (NORVASC) 5 MG tablet TAKE 1 TABLET BY MOUTH EVERY DAY 90 tablet 3  . aspirin EC 81 MG tablet Take 81 mg by mouth daily.    Marland Kitchen atorvastatin (LIPITOR) 80 MG tablet TAKE 1 TABLET BY MOUTH EVERY DAY 90 tablet 3  . Blood Glucose Monitoring Suppl (ONETOUCH VERIO) w/Device KIT Use to check FSBS three times a day. Dx: E11.59,  Z79.4 1 kit 0  . carvedilol (COREG) 25 MG tablet Take 1 tablet (25 mg total) by mouth 2 (two) times daily with a meal. 180 tablet 1  . gabapentin (NEURONTIN) 100 MG capsule Take 2 capsules (200 mg total) by mouth 2 (two) times daily. 120 capsule 5  . glucose blood (ONETOUCH VERIO) test strip Use to check FSBS three times a day. Dx: E11.59, Z79.4 100 each 2  . HUMALOG MIX 50/50 KWIKPEN (50-50) 100 UNIT/ML Kwikpen Inject 26 Units into the skin 2 (two) times daily. Dx: E11.59, Z79.4 15 mL 5  . insulin lispro (HUMALOG) 100 UNIT/ML injection Inject 0.08 mLs (8 Units total) into the skin 3 (three) times daily before meals. 10 mL 5  . Insulin Pen Needle (BD PEN NEEDLE MICRO U/F) 32G X 6 MM MISC Use twice a day with Humalog Mix Kwikpen. Dx: E11.59, Z79.4 100 each 5  . Insulin Syringe-Needle U-100 (INSULIN SYRINGE .3CC/31GX5/16") 31G X 5/16" 0.3 ML MISC Use TID to inject Humalog insulin. Dx: E11.59, Z79.4 100 each 5  . Lancet Devices (ONE TOUCH DELICA LANCING DEV) MISC Use to check FSBS three times a day. Dx: E11.59, Z79.4 1 each 0  . levothyroxine (SYNTHROID) 50 MCG tablet TAKE 1 TABLET BY MOUTH EVERY DAY 90  tablet 0  . omeprazole (PRILOSEC) 20 MG capsule Take 30- 60 min before your first and last meals of the day    . OneTouch Delica Lancets 16X MISC Use to check FSBS three times a day. Dx: E11.59, Z79.4 100 each 2  . traMADol (ULTRAM) 50 MG tablet Take 1 tablet (50 mg total) by mouth 3 (three) times daily as needed. 15 tablet 0  . warfarin (COUMADIN) 3 MG tablet TAKE 1 TO 1.5 TABLETS BY MOUTH DAILY AS DIRECTED BY COUMADIN CLINIC 120 tablet 0   No current facility-administered medications on file prior to visit.    Past Surgical History:  Procedure Laterality Date  . ABDOMINAL HYSTERECTOMY    . APPENDECTOMY    . BREAST BIOPSY Left   . CARDIAC CATHETERIZATION    . CHOLECYSTECTOMY    . VIDEO BRONCHOSCOPY WITH ENDOBRONCHIAL ULTRASOUND N/A 01/20/2019   Procedure: VIDEO BRONCHOSCOPY WITH BIOPSY;   Surgeon: Marshell Garfinkel, MD;  Location: [facility];  Service: Pulmonary;  Laterality: N/A;    Allergies  Allergen Reactions  . Lisinopril Cough    Social History   Socioeconomic History  . Marital status: Widowed    Spouse name: Not on file  . Number of children: Not on file  . Years of education: Not on file  . Highest education level: Not on file  Occupational History  . Not on file  Tobacco Use  . Smoking status: Former Smoker    Packs/day: 0.25    Types: Cigarettes    Quit date: 12/30/1963    Years since quitting: 56.2  . Smokeless tobacco: Never Used  . Tobacco comment: pt smoked 5-6/cigs daily 12/30/2018  Substance and Sexual Activity  . Alcohol use: Not Currently  . Drug use: Never  . Sexual activity: Not on file  Other Topics Concern  . Not on file  Social History Narrative  . Not on file   Social Determinants of Health   Financial Resource Strain:   . Difficulty of Paying Living Expenses:   Food Insecurity:   . Worried About Charity fundraiser in the Last Year:   . Arboriculturist in the Last Year:   Transportation Needs:   . Film/video editor (Medical):   Marland Kitchen Lack of Transportation (Non-Medical):   Physical Activity:   . Days of Exercise per Week:   . Minutes of Exercise per Session:   Stress:   . Feeling of Stress :   Social Connections:   . Frequency of Communication with Friends and Family:   . Frequency of Social Gatherings with Friends and Family:   . Attends Religious Services:   . Active Member of Clubs or Organizations:   . Attends Archivist Meetings:   Marland Kitchen Marital Status:   Intimate Partner Violence:   . Fear of Current or Ex-Partner:   . Emotionally Abused:   Marland Kitchen Physically Abused:   . Sexually Abused:     Family History  Problem Relation Age of Onset  . Breast cancer Mother   . Coronary artery disease Mother   . Diabetes Mother   . Stroke Father     BP (!) 130/92   Ht 5' 8" (1.727 m)   Wt 220 lb (99.8 kg)   BMI  33.45 kg/m   Review of Systems: See HPI above.     Objective:  Physical Exam:  Gen: NAD, comfortable in exam room  Right hand/Fingers/Wrist Inspection: Trace swelling, no bony deformity or atrophy of the hypothenar  region.  Patient able to reproduce locking of the fourth digit in flexion Palpation: Tender to palpation palmar aspect of A1 pulley of the fourth digit, along with some tenderness throughout her palm.  Nontender to palpation of DIP, PIP, scaphoid/snuff box, scaphoid tubercle  AROM/PROM: Full flexion, extension, supination, pronation  Strength: 5/5 flexion, 5/5 extension, 5/5 pronation, 5/5 supination Special tests: (-) CMC grind, (-) Tinel at carpal tunnel  Limited ultrasound examination: Palmar aspect of the hand was evaluated using ultrasound.  The flexor tendon of the fourth digit was identified and showed a significant ring of peritendinous fluid with thickening of the A1 pulley.  Dynamic evaluation did not show any obvious locking during the time of examination.  Adjacent tendons were also evaluated and did not show fluid or abnormalities.   Assessment & Plan:  1.  Trigger finger Patient with classic symptoms of trigger finger, along with ultrasound findings showing peritendinous inflammation consistent with this.  We discussed the options, including 6-10 weeks of bracing, corticosteroid injection, referral for surgical evaluation.  Patient elected to proceed with corticosteroid injection today.  Tendon Sheath Injection Procedure Note Desiree Harrison 27-Feb-1946  Procedure: Tendon Sheath Injection for Trigger Finger Indications: Pain  Procedure Details Verbal consent was obtained. Risks (including rare risk of infection, potential risk for skin lightening and potential atrophy), benefits and alternatives were discussed. Timeout was performed.  Prepped with alcohol swab and Ethyl Chloride used for anesthesia. Under sterile conditions and using US guidance with tendon  visualized in short axis, patient injected at palmar crease with with 45 degree angle; injected directly into right 4th digit flexor tendon sheath. Medication flowed freely without resistance.  Needle size: 25 gauge 5/8 inch Injection: 1/2 cc of bupivicaine 1% and Depo-Medrol 20 mg

## 2020-03-31 ENCOUNTER — Encounter: Payer: Self-pay | Admitting: Family Medicine

## 2020-04-06 ENCOUNTER — Other Ambulatory Visit: Payer: Self-pay

## 2020-04-06 MED ORDER — LEVOTHYROXINE SODIUM 50 MCG PO TABS: 50.0000 ug | ORAL_TABLET | Freq: Every day | ORAL | 0 refills | Status: DC

## 2020-04-11 ENCOUNTER — Ambulatory Visit (INDEPENDENT_AMBULATORY_CARE_PROVIDER_SITE_OTHER): Payer: Medicare Other

## 2020-04-11 ENCOUNTER — Other Ambulatory Visit: Payer: Self-pay

## 2020-04-11 DIAGNOSIS — Z5181 Encounter for therapeutic drug level monitoring: Secondary | ICD-10-CM

## 2020-04-11 DIAGNOSIS — Z7901 Long term (current) use of anticoagulants: Secondary | ICD-10-CM | POA: Diagnosis not present

## 2020-04-11 DIAGNOSIS — I253 Aneurysm of heart: Secondary | ICD-10-CM | POA: Diagnosis not present

## 2020-04-11 LAB — POCT INR: INR: 1.7 — AB (ref 2.0–3.0)

## 2020-04-11 NOTE — Patient Instructions (Signed)
Take 2 tablets today and then continue taking 1 tablet by mouth daily except take 1.5 tablets Mon, Wed, Fri. Recheck INR in 6 weeks.

## 2020-05-09 ENCOUNTER — Ambulatory Visit (INDEPENDENT_AMBULATORY_CARE_PROVIDER_SITE_OTHER): Payer: Medicare Other | Admitting: Family Medicine

## 2020-05-09 ENCOUNTER — Other Ambulatory Visit: Payer: Self-pay

## 2020-05-09 ENCOUNTER — Encounter: Payer: Self-pay | Admitting: Family Medicine

## 2020-05-09 VITALS — BP 132/80 | Ht 68.0 in | Wt 185.0 lb

## 2020-05-09 DIAGNOSIS — M6530 Trigger finger, unspecified finger: Secondary | ICD-10-CM

## 2020-05-09 NOTE — Progress Notes (Addendum)
PCP: Nicolette Bang, DO  Subjective:   HPI: Patient is a 74 y.o. female here for trigger finger follow up.  Ms. Mamaril states her pain and locking of her finger began to improve quickly after steroid injection approximately 1 month ago. She denies any additional pain at this time. She has woken up in the AM once or twice with the finger locked in flexed position but has been able to unlock it without using her other arm. She denies any numbness or tingling, focal weakness.   Past Medical History:  Diagnosis Date  . Acute systolic heart failure ([place])   . CAD in native artery   . Cardiomyopathy ([place])   . Gout   . Long term (current) use of anticoagulants   . S/P internal cardiac defibrillator procedure    placed 10/28/15  . STEMI (ST elevation myocardial infarction) ([place])   . Type 2 diabetes mellitus, with long-term current use of insulin ([place])   . Ventricular aneurysm     Current Outpatient Medications on File Prior to Visit  Medication Sig Dispense Refill  . amLODipine (NORVASC) 5 MG tablet TAKE 1 TABLET BY MOUTH EVERY DAY 90 tablet 3  . aspirin EC 81 MG tablet Take 81 mg by mouth daily.    Marland Kitchen atorvastatin (LIPITOR) 80 MG tablet TAKE 1 TABLET BY MOUTH EVERY DAY 90 tablet 3  . Blood Glucose Monitoring Suppl (ONETOUCH VERIO) w/Device KIT Use to check FSBS three times a day. Dx: E11.59, Z79.4 1 kit 0  . carvedilol (COREG) 25 MG tablet Take 1 tablet (25 mg total) by mouth 2 (two) times daily with a meal. 180 tablet 1  . gabapentin (NEURONTIN) 100 MG capsule Take 2 capsules (200 mg total) by mouth 2 (two) times daily. 120 capsule 5  . glucose blood (ONETOUCH VERIO) test strip Use to check FSBS three times a day. Dx: E11.59, Z79.4 100 each 2  . HUMALOG MIX 50/50 KWIKPEN (50-50) 100 UNIT/ML Kwikpen Inject 26 Units into the skin 2 (two) times daily. Dx: E11.59, Z79.4 15 mL 5  . insulin lispro (HUMALOG) 100 UNIT/ML injection Inject 0.08 mLs (8 Units total) into the skin 3 (three)  times daily before meals. 10 mL 5  . Insulin Pen Needle (BD PEN NEEDLE MICRO U/F) 32G X 6 MM MISC Use twice a day with Humalog Mix Kwikpen. Dx: E11.59, Z79.4 100 each 5  . Insulin Syringe-Needle U-100 (INSULIN SYRINGE .3CC/31GX5/16") 31G X 5/16" 0.3 ML MISC Use TID to inject Humalog insulin. Dx: E11.59, Z79.4 100 each 5  . Lancet Devices (ONE TOUCH DELICA LANCING DEV) MISC Use to check FSBS three times a day. Dx: E11.59, Z79.4 1 each 0  . levothyroxine (SYNTHROID) 50 MCG tablet Take 1 tablet (50 mcg total) by mouth daily. 90 tablet 0  . omeprazole (PRILOSEC) 20 MG capsule Take 30- 60 min before your first and last meals of the day    . OneTouch Delica Lancets 78I MISC Use to check FSBS three times a day. Dx: E11.59, Z79.4 100 each 2  . traMADol (ULTRAM) 50 MG tablet Take 1 tablet (50 mg total) by mouth 3 (three) times daily as needed. 15 tablet 0  . warfarin (COUMADIN) 3 MG tablet TAKE 1 TO 1.5 TABLETS BY MOUTH DAILY AS DIRECTED BY COUMADIN CLINIC 120 tablet 0   No current facility-administered medications on file prior to visit.    Past Surgical History:  Procedure Laterality Date  . ABDOMINAL HYSTERECTOMY    . APPENDECTOMY    .  BREAST BIOPSY Left   . CARDIAC CATHETERIZATION    . CHOLECYSTECTOMY    . VIDEO BRONCHOSCOPY WITH ENDOBRONCHIAL ULTRASOUND N/A 01/20/2019   Procedure: VIDEO BRONCHOSCOPY WITH BIOPSY;  Surgeon: Marshell Garfinkel, MD;  Location: [facility];  Service: Pulmonary;  Laterality: N/A;    Allergies  Allergen Reactions  . Lisinopril Cough    Social History   Socioeconomic History  . Marital status: Widowed    Spouse name: Not on file  . Number of children: Not on file  . Years of education: Not on file  . Highest education level: Not on file  Occupational History  . Not on file  Tobacco Use  . Smoking status: Former Smoker    Packs/day: 0.25    Types: Cigarettes    Quit date: 12/30/1963    Years since quitting: 56.3  . Smokeless tobacco: Never Used  . Tobacco  comment: pt smoked 5-6/cigs daily 12/30/2018  Substance and Sexual Activity  . Alcohol use: Not Currently  . Drug use: Never  . Sexual activity: Not on file  Other Topics Concern  . Not on file  Social History Narrative  . Not on file   Social Determinants of Health   Financial Resource Strain:   . Difficulty of Paying Living Expenses: Not on file  Food Insecurity:   . Worried About Charity fundraiser in the Last Year: Not on file  . Ran Out of Food in the Last Year: Not on file  Transportation Needs:   . Lack of Transportation (Medical): Not on file  . Lack of Transportation (Non-Medical): Not on file  Physical Activity:   . Days of Exercise per Week: Not on file  . Minutes of Exercise per Session: Not on file  Stress:   . Feeling of Stress : Not on file  Social Connections:   . Frequency of Communication with Friends and Family: Not on file  . Frequency of Social Gatherings with Friends and Family: Not on file  . Attends Religious Services: Not on file  . Active Member of Clubs or Organizations: Not on file  . Attends Archivist Meetings: Not on file  . Marital Status: Not on file  Intimate Partner Violence:   . Fear of Current or Ex-Partner: Not on file  . Emotionally Abused: Not on file  . Physically Abused: Not on file  . Sexually Abused: Not on file    Family History  Problem Relation Age of Onset  . Breast cancer Mother   . Coronary artery disease Mother   . Diabetes Mother   . Stroke Father     BP 132/80   Ht _0  (1.727 m)   Wt 185 lb (83.9 kg)   BMI 28.13 kg/m   Review of Systems: See HPI above.     Objective:  Physical Exam:  Gen: NAD, comfortable in exam room  Right hand/fingers:  No gross abnormalities on inspection.  No TTP along the 4th digit on the palmar or dorsal aspect. No nodule present at base of the 4th digit. No TTP on the palm or dorsal hand pain to palpation.  Full ROM intact   Assessment & Plan:  1. Right 4th  trigger finger: S/p injection on 03/30/2020. Patient's symptoms have largely resolved at this time. No further intervention indicated. Follow up PRN. Tylenol, aleve if needed.

## 2020-05-19 ENCOUNTER — Ambulatory Visit (INDEPENDENT_AMBULATORY_CARE_PROVIDER_SITE_OTHER): Payer: Medicare Other | Admitting: *Deleted

## 2020-05-19 DIAGNOSIS — Z95810 Presence of automatic (implantable) cardiac defibrillator: Secondary | ICD-10-CM | POA: Diagnosis not present

## 2020-05-19 LAB — CUP PACEART REMOTE DEVICE CHECK
Battery Remaining Longevity: 144 mo
Battery Remaining Percentage: 100 %
Brady Statistic RV Percent Paced: 0 %
Date Time Interrogation Session: 20210909021100
HighPow Impedance: 79 Ohm
Implantable Pulse Generator Implant Date: 20170217
Lead Channel Impedance Value: 978 Ohm
Lead Channel Pacing Threshold Amplitude: 1 V
Lead Channel Pacing Threshold Pulse Width: 0.4 ms
Lead Channel Setting Pacing Amplitude: 2 V
Lead Channel Setting Pacing Pulse Width: 0.4 ms
Lead Channel Setting Sensing Sensitivity: 0.5 mV
Pulse Gen Serial Number: 213123

## 2020-05-20 NOTE — Progress Notes (Signed)
Remote ICD transmission.   

## 2020-05-25 ENCOUNTER — Ambulatory Visit (INDEPENDENT_AMBULATORY_CARE_PROVIDER_SITE_OTHER): Payer: Medicare Other

## 2020-05-25 ENCOUNTER — Other Ambulatory Visit: Payer: Self-pay

## 2020-05-25 DIAGNOSIS — I253 Aneurysm of heart: Secondary | ICD-10-CM | POA: Diagnosis not present

## 2020-05-25 DIAGNOSIS — Z7901 Long term (current) use of anticoagulants: Secondary | ICD-10-CM | POA: Diagnosis not present

## 2020-05-25 LAB — POCT INR: INR: 2.5 (ref 2.0–3.0)

## 2020-05-25 NOTE — Patient Instructions (Signed)
continue taking 1 tablet by mouth daily except take 1.5 tablets Mon, Wed, Fri. Recheck INR in 6 weeks.  

## 2020-06-03 ENCOUNTER — Other Ambulatory Visit: Payer: Self-pay | Admitting: Cardiovascular Disease

## 2020-06-10 ENCOUNTER — Telehealth (INDEPENDENT_AMBULATORY_CARE_PROVIDER_SITE_OTHER): Payer: Medicare Other | Admitting: Internal Medicine

## 2020-06-10 ENCOUNTER — Encounter: Payer: Self-pay | Admitting: Internal Medicine

## 2020-06-10 ENCOUNTER — Other Ambulatory Visit: Payer: Self-pay

## 2020-06-10 DIAGNOSIS — Z794 Long term (current) use of insulin: Secondary | ICD-10-CM

## 2020-06-10 DIAGNOSIS — R809 Proteinuria, unspecified: Secondary | ICD-10-CM | POA: Diagnosis not present

## 2020-06-10 DIAGNOSIS — E782 Mixed hyperlipidemia: Secondary | ICD-10-CM

## 2020-06-10 DIAGNOSIS — Z1211 Encounter for screening for malignant neoplasm of colon: Secondary | ICD-10-CM

## 2020-06-10 DIAGNOSIS — E1159 Type 2 diabetes mellitus with other circulatory complications: Secondary | ICD-10-CM

## 2020-06-10 DIAGNOSIS — I10 Essential (primary) hypertension: Secondary | ICD-10-CM

## 2020-06-10 MED ORDER — LOSARTAN POTASSIUM 50 MG PO TABS: 50.0000 mg | ORAL_TABLET | Freq: Every day | ORAL | 3 refills | Status: DC

## 2020-06-10 NOTE — Progress Notes (Signed)
Virtual Visit via Telephone Note  I connected with Desiree Harrison, on 06/10/2020 at 9:24 AM by telephone due to the COVID-19 pandemic and verified that I am speaking with the correct person using two identifiers.   Consent: I discussed the limitations, risks, security and privacy concerns of performing an evaluation and management service by telephone and the availability of in person appointments. I also discussed with the patient that there may be a patient responsible charge related to this service. The patient expressed understanding and agreed to proceed.   Location of Patient: Home   Location of Provider: Clinic    Persons participating in Telemedicine visit: [facility] Dr. Juleen China      History of Present Illness: Patient has a visit to f/u chronic medical conditions.   Diabetes mellitus, Type 2 Disease Monitoring             Blood Sugar Ranges: Fasting - 110-120s. Highest has been is 150s.              Polyuria: no              Visual problems: no   Urine Microalbumin 177 (Nov 2020)---intolerant of Ace inhibitor due to cough   Last A1C: 6.6 (July 2021)   Medications: Humalog 50/50 mix 26u BID, Humalog 8u TID  Medication Compliance: no  Medication Side Effects             Hypoglycemia: yes, has been in the 60s a few times over the past month when she gets busy and forgets to eat a couple meals     Chronic HTN Disease Monitoring:  Home BP Monitoring - Does not monitor  Chest pain- no  Dyspnea- no Headache - no  Medications: Amlodipine 5 mg, Coreg 25 mg BID Compliance- yes Lightheadedness- no  Edema- no      Past Medical History:  Diagnosis Date  . Acute systolic heart failure ([place])   . CAD in native artery   . Cardiomyopathy ([place])   . Gout   . Long term (current) use of anticoagulants   . S/P internal cardiac defibrillator procedure    placed 10/28/15  . STEMI (ST elevation myocardial infarction) ([place])   . Type 2  diabetes mellitus, with long-term current use of insulin ([place])   . Ventricular aneurysm    Allergies  Allergen Reactions  . Lisinopril Cough    Current Outpatient Medications on File Prior to Visit  Medication Sig Dispense Refill  . amLODipine (NORVASC) 5 MG tablet TAKE 1 TABLET BY MOUTH EVERY DAY 90 tablet 3  . aspirin EC 81 MG tablet Take 81 mg by mouth daily.    Marland Kitchen atorvastatin (LIPITOR) 80 MG tablet TAKE 1 TABLET BY MOUTH EVERY DAY 90 tablet 3  . Blood Glucose Monitoring Suppl (ONETOUCH VERIO) w/Device KIT Use to check FSBS three times a day. Dx: E11.59, Z79.4 1 kit 0  . carvedilol (COREG) 25 MG tablet Take 1 tablet (25 mg total) by mouth 2 (two) times daily with a meal. 180 tablet 1  . gabapentin (NEURONTIN) 100 MG capsule Take 2 capsules (200 mg total) by mouth 2 (two) times daily. 120 capsule 5  . glucose blood (ONETOUCH VERIO) test strip Use to check FSBS three times a day. Dx: E11.59, Z79.4 100 each 2  . HUMALOG MIX 50/50 KWIKPEN (50-50) 100 UNIT/ML Kwikpen Inject 26 Units into the skin 2 (two) times daily. Dx: E11.59, Z79.4 15 mL 5  . insulin lispro (HUMALOG) 100 UNIT/ML  injection Inject 0.08 mLs (8 Units total) into the skin 3 (three) times daily before meals. 10 mL 5  . Insulin Pen Needle (BD PEN NEEDLE MICRO U/F) 32G X 6 MM MISC Use twice a day with Humalog Mix Kwikpen. Dx: E11.59, Z79.4 100 each 5  . Insulin Syringe-Needle U-100 (INSULIN SYRINGE .3CC/31GX5/16") 31G X 5/16" 0.3 ML MISC Use TID to inject Humalog insulin. Dx: E11.59, Z79.4 100 each 5  . Lancet Devices (ONE TOUCH DELICA LANCING DEV) MISC Use to check FSBS three times a day. Dx: E11.59, Z79.4 1 each 0  . levothyroxine (SYNTHROID) 50 MCG tablet Take 1 tablet (50 mcg total) by mouth daily. 90 tablet 0  . omeprazole (PRILOSEC) 20 MG capsule Take 30- 60 min before your first and last meals of the day    . OneTouch Delica Lancets 88P MISC Use to check FSBS three times a day. Dx: E11.59, Z79.4 100 each 2  . warfarin  (COUMADIN) 3 MG tablet TAKE 1 TO 1.5 TABLETS BY MOUTH DAILY AS DIRECTED BY COUMADIN CLINIC 120 tablet 0   No current facility-administered medications on file prior to visit.    Observations/Objective: NAD. Speaking clearly.  Work of breathing normal.  Alert and oriented. Mood appropriate.   Assessment and Plan: 1. Type 2 diabetes mellitus with other circulatory complication, with long-term current use of insulin (HCC) Last A1c showed very good control with result of 6.6. Fasting CBGs in range. If repeat A1c well controlled, would recommend decreasing insulin dose given age and a few hypoglycemic episodes.  - Hemoglobin A1c; Future - Comprehensive metabolic panel; Future - losartan (COZAAR) 50 MG tablet; Take 1 tablet (50 mg total) by mouth daily.  Dispense: 90 tablet; Refill: 3  2. Essential hypertension Asymptomatic. Monitor at future visits. Continue current regimen.  - CBC with Differential; Future - Comprehensive metabolic panel; Future - losartan (COZAAR) 50 MG tablet; Take 1 tablet (50 mg total) by mouth daily.  Dispense: 90 tablet; Refill: 3  3. Mixed hyperlipidemia Continue statin and ASA therapy.   4. Microalbuminuria Given microalbuminuria and CKD, will start patient on ARB. Obtain BMET 3 days after starting medication to monitor. Intolerant of Ace inhibitors due to cough.  - losartan (COZAAR) 50 MG tablet; Take 1 tablet (50 mg total) by mouth daily.  Dispense: 90 tablet; Refill: 3  5. Screening for colon cancer Discussed colonoscopy vs. Cologuard for colon cancer. Given intolerance of prep in past, wishes to proceed with Cologuard.  - Cologuard  Follow Up Instructions: Lab visit 10/4    I discussed the assessment and treatment plan with the patient. The patient was provided an opportunity to ask questions and all were answered. The patient agreed with the plan and demonstrated an understanding of the instructions.   The patient was advised to call back or seek an  in-person evaluation if the symptoms worsen or if the condition fails to improve as anticipated.     I provided 28 minutes total of non-face-to-face time during this encounter including median intraservice time, reviewing previous notes, investigations, ordering medications, medical decision making, coordinating care and patient verbalized understanding at the end of the visit.    Phill Myron, D.O. Primary Care at [facility]  06/10/2020, 9:24 AM

## 2020-06-13 ENCOUNTER — Other Ambulatory Visit (INDEPENDENT_AMBULATORY_CARE_PROVIDER_SITE_OTHER): Payer: Medicare Other

## 2020-06-13 ENCOUNTER — Other Ambulatory Visit: Payer: Self-pay

## 2020-06-13 DIAGNOSIS — I10 Essential (primary) hypertension: Secondary | ICD-10-CM | POA: Diagnosis not present

## 2020-06-13 DIAGNOSIS — E1159 Type 2 diabetes mellitus with other circulatory complications: Secondary | ICD-10-CM

## 2020-06-13 DIAGNOSIS — Z794 Long term (current) use of insulin: Secondary | ICD-10-CM | POA: Diagnosis not present

## 2020-06-13 DIAGNOSIS — Z23 Encounter for immunization: Secondary | ICD-10-CM | POA: Diagnosis not present

## 2020-06-14 LAB — CBC WITH DIFFERENTIAL/PLATELET
Basophils Absolute: 0 10*3/uL (ref 0.0–0.2)
Basos: 1 %
EOS (ABSOLUTE): 0.1 10*3/uL (ref 0.0–0.4)
Eos: 2 %
Hematocrit: 43.7 % (ref 34.0–46.6)
Hemoglobin: 14.3 g/dL (ref 11.1–15.9)
Immature Grans (Abs): 0 10*3/uL (ref 0.0–0.1)
Immature Granulocytes: 0 %
Lymphocytes Absolute: 2.8 10*3/uL (ref 0.7–3.1)
Lymphs: 41 %
MCH: 26.6 pg (ref 26.6–33.0)
MCHC: 32.7 g/dL (ref 31.5–35.7)
MCV: 81 fL (ref 79–97)
Monocytes Absolute: 0.7 10*3/uL (ref 0.1–0.9)
Monocytes: 10 %
Neutrophils Absolute: 3.3 10*3/uL (ref 1.4–7.0)
Neutrophils: 46 %
Platelets: 214 10*3/uL (ref 150–450)
RBC: 5.37 x10E6/uL — ABNORMAL HIGH (ref 3.77–5.28)
RDW: 14.5 % (ref 11.7–15.4)
WBC: 6.9 10*3/uL (ref 3.4–10.8)

## 2020-06-14 LAB — COMPREHENSIVE METABOLIC PANEL
ALT: 13 IU/L (ref 0–32)
AST: 19 IU/L (ref 0–40)
Albumin/Globulin Ratio: 0.9 — ABNORMAL LOW (ref 1.2–2.2)
Albumin: 3.4 g/dL — ABNORMAL LOW (ref 3.7–4.7)
Alkaline Phosphatase: 89 IU/L (ref 44–121)
BUN/Creatinine Ratio: 16 (ref 12–28)
BUN: 17 mg/dL (ref 8–27)
Bilirubin Total: 0.3 mg/dL (ref 0.0–1.2)
CO2: 24 mmol/L (ref 20–29)
Calcium: 8.8 mg/dL (ref 8.7–10.3)
Chloride: 111 mmol/L — ABNORMAL HIGH (ref 96–106)
Creatinine, Ser: 1.06 mg/dL — ABNORMAL HIGH (ref 0.57–1.00)
GFR calc Af Amer: 60 mL/min/{1.73_m2} (ref >59)
GFR calc non Af Amer: 52 mL/min/{1.73_m2} — ABNORMAL LOW (ref >59)
Globulin, Total: 3.7 g/dL (ref 1.5–4.5)
Glucose: 140 mg/dL — ABNORMAL HIGH (ref 65–99)
Potassium: 4.2 mmol/L (ref 3.5–5.2)
Sodium: 147 mmol/L — ABNORMAL HIGH (ref 134–144)
Total Protein: 7.1 g/dL (ref 6.0–8.5)

## 2020-06-14 LAB — HEMOGLOBIN A1C
Est. average glucose Bld gHb Est-mCnc: 171 mg/dL
Hgb A1c MFr Bld: 7.6 % — ABNORMAL HIGH (ref 4.8–5.6)

## 2020-06-22 NOTE — Progress Notes (Signed)
Patient notified of results & recommendations. Expressed understanding.

## 2020-06-24 ENCOUNTER — Other Ambulatory Visit: Payer: Self-pay

## 2020-06-24 MED ORDER — LEVOTHYROXINE SODIUM 50 MCG PO TABS: 50.0000 ug | ORAL_TABLET | Freq: Every day | ORAL | 0 refills | Status: DC

## 2020-06-30 ENCOUNTER — Other Ambulatory Visit: Payer: Self-pay

## 2020-06-30 DIAGNOSIS — Z794 Long term (current) use of insulin: Secondary | ICD-10-CM

## 2020-06-30 DIAGNOSIS — E1159 Type 2 diabetes mellitus with other circulatory complications: Secondary | ICD-10-CM

## 2020-07-01 MED ORDER — GABAPENTIN 100 MG PO CAPS: 200.0000 mg | ORAL_CAPSULE | Freq: Two times a day (BID) | ORAL | 5 refills | Status: DC

## 2020-07-04 ENCOUNTER — Telehealth: Payer: Self-pay

## 2020-07-04 NOTE — Telephone Encounter (Signed)
LMOM TO R/S 

## 2020-07-05 ENCOUNTER — Telehealth: Payer: Self-pay

## 2020-07-05 NOTE — Telephone Encounter (Signed)
lmom to reschedule

## 2020-07-11 ENCOUNTER — Other Ambulatory Visit: Payer: Self-pay

## 2020-07-11 ENCOUNTER — Ambulatory Visit (INDEPENDENT_AMBULATORY_CARE_PROVIDER_SITE_OTHER): Payer: Medicare Other

## 2020-07-11 DIAGNOSIS — Z7901 Long term (current) use of anticoagulants: Secondary | ICD-10-CM | POA: Diagnosis not present

## 2020-07-11 DIAGNOSIS — I253 Aneurysm of heart: Secondary | ICD-10-CM

## 2020-07-11 LAB — POCT INR: INR: 3.6 — AB (ref 2.0–3.0)

## 2020-07-11 NOTE — Patient Instructions (Signed)
Hold today and then continue taking 1 tablet by mouth daily except take 1.5 tablets Mon, Wed, Fri. Recheck INR in 4 weeks.

## 2020-07-13 IMAGING — DX CHEST - 2 VIEW
2 series · 2 of 2 positions shown · non-contrast
Comparison: Radiographs November 24, 2018.

CLINICAL DATA: Dyspnea on exertion.

EXAM:
CHEST - 2 VIEW

[chest pa]
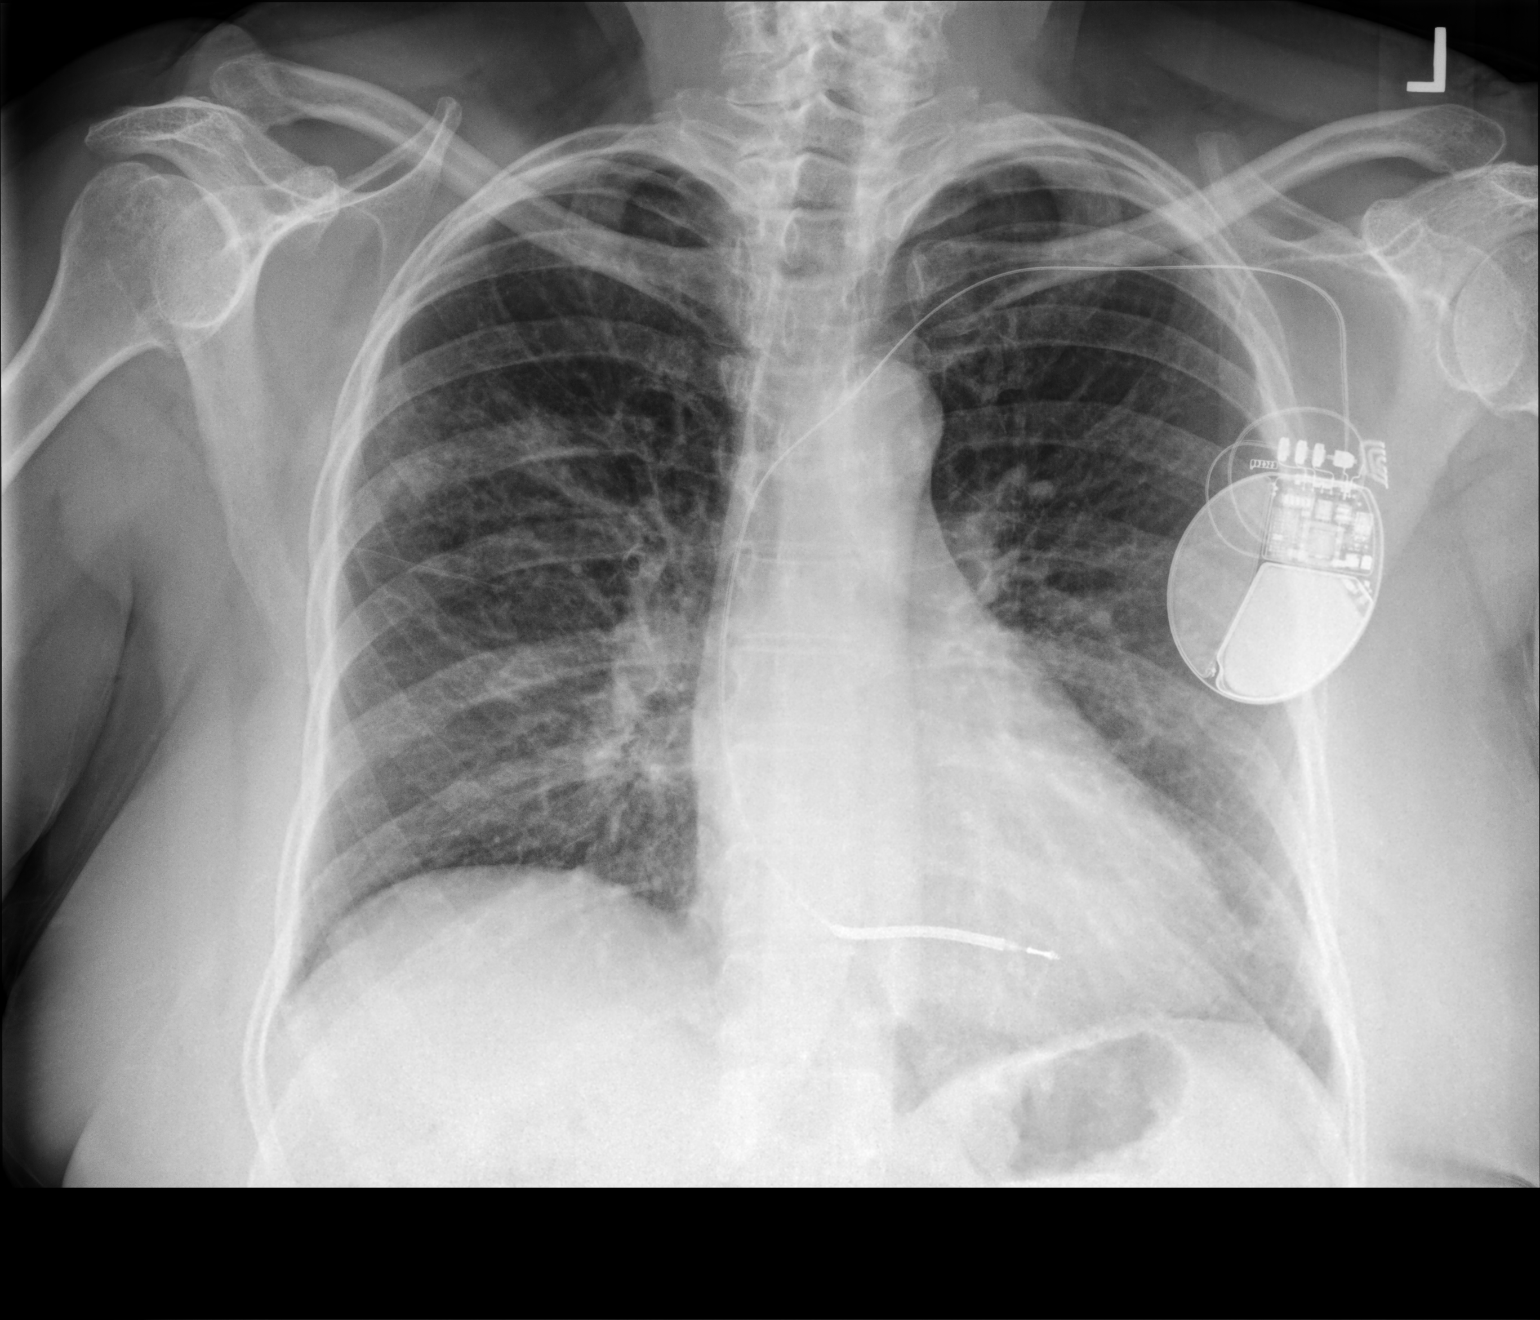

[chest lat]
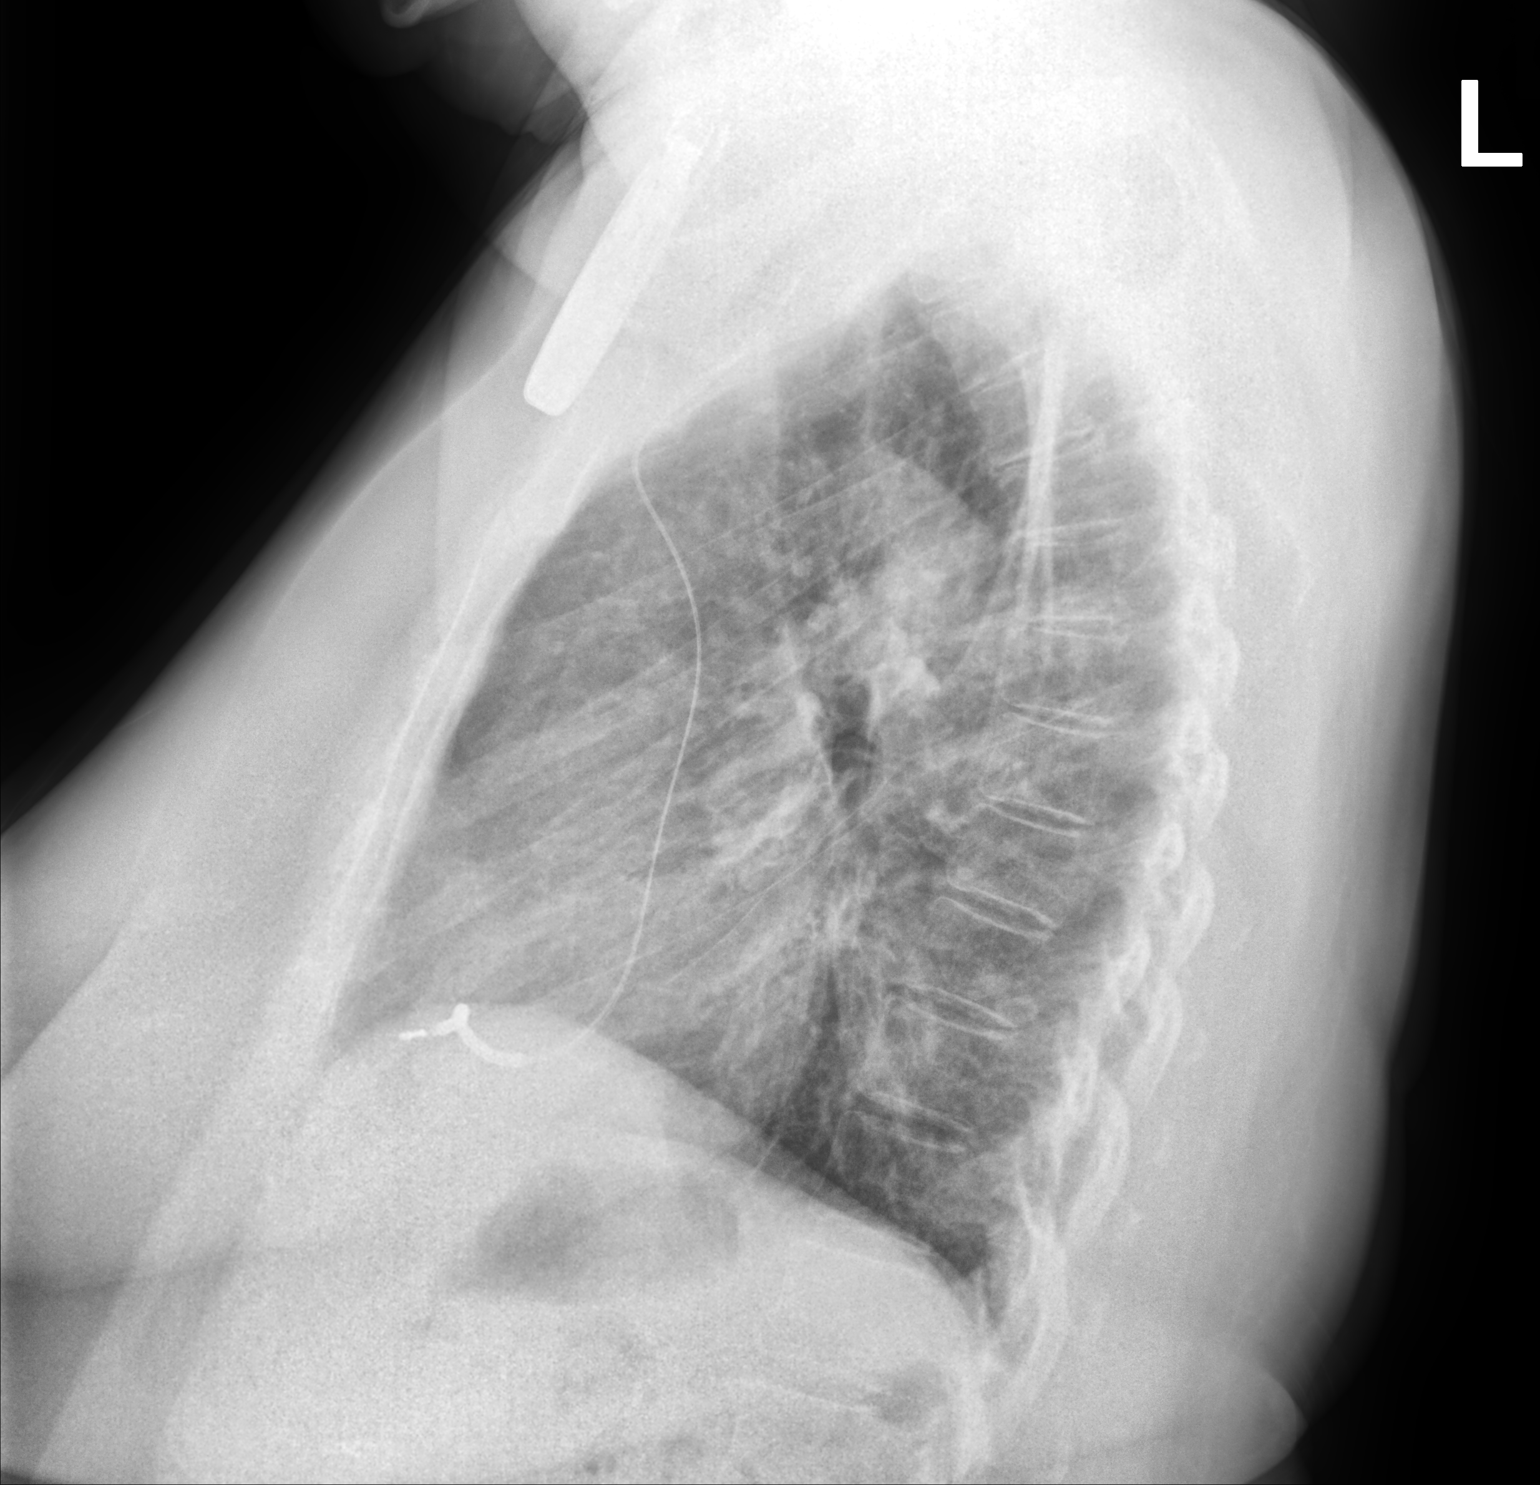

[2 of 2 positions shown; findings below may reference images not displayed]

FINDINGS: The heart size and mediastinal contours are within normal limits.
Stable position of single lead left-sided pacemaker. No pneumothorax
or pleural effusion is noted. Left lung is clear. Stable ill-defined
right upper lobe opacity is noted; given the lack of change, it is
unlikely that this represents pneumonia. The visualized skeletal
structures are unremarkable.
IMPRESSION: Stable ill-defined right upper lobe opacity is noted. Given the lack
of change since prior exam, it is unlikely this represents
pneumonia. CT scan of the chest is recommend to rule out other
possible pathology, such as neoplasm. These results will be called
to the ordering clinician or representative by the Radiologist
Assistant, and communication documented in the PACS or zVision
Dashboard.

## 2020-07-18 IMAGING — CT CT MAXILLOFACIAL WITHOUT CONTRAST
3 series · 7 of 47 positions shown, 8 images · non-contrast
Comparison: None.

CLINICAL DATA: Persistent cough and sinus problems.

EXAM:
CT MAXILLOFACIAL WITHOUT CONTRAST
TECHNIQUE: Multidetector CT images of the paranasal sinuses were obtained using
the standard protocol without intravenous contrast.

[Series 3: facialbone 2.0 st · axial · 0.35mm/px · z∈[+1212,+1212]mm · 1 of 85 slices shown, 2 images]
[im 44/85  brain]
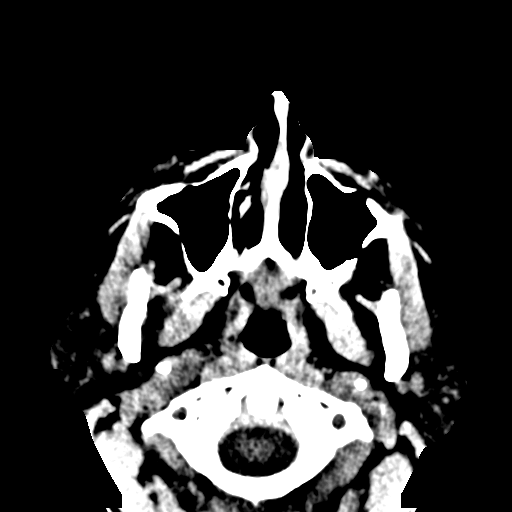
[im 44/85  bone]
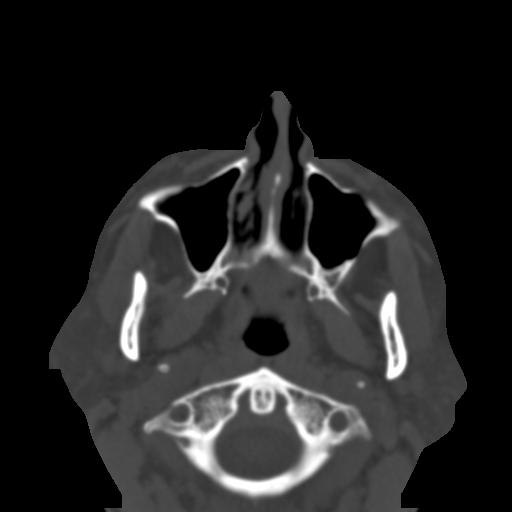

[Series 7: facialbone 2.0 cor st · coronal · 0.33mm/px · 3 of 75 slices shown]
[im 25/75  bone]
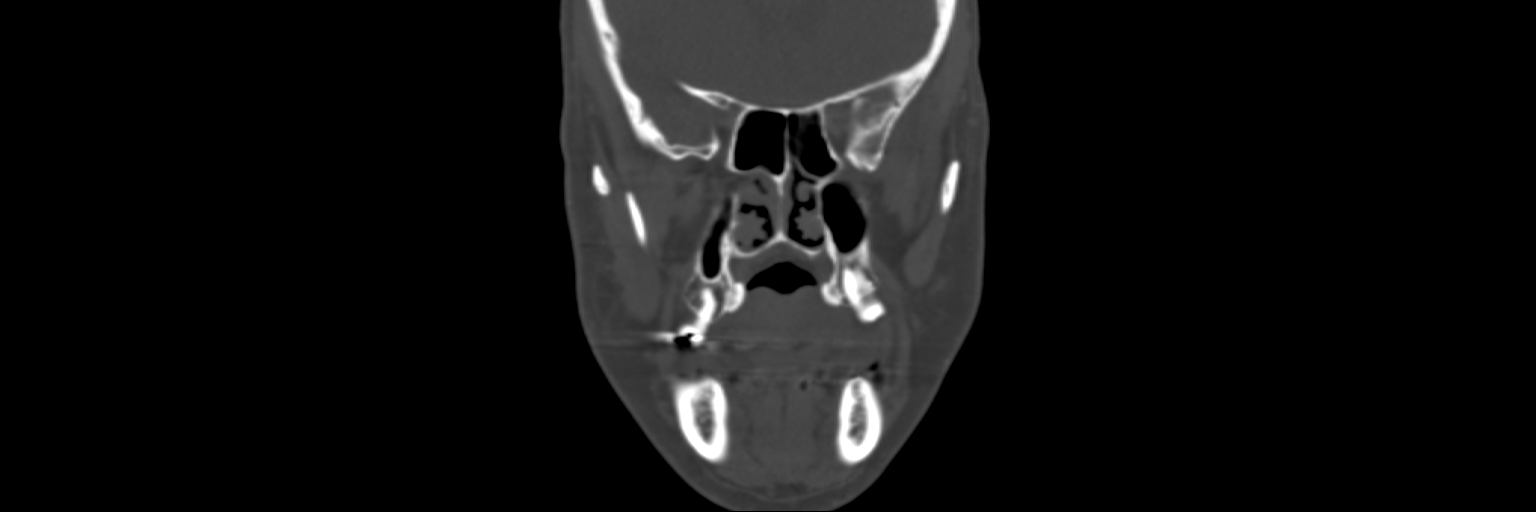
[im 33/75  bone]
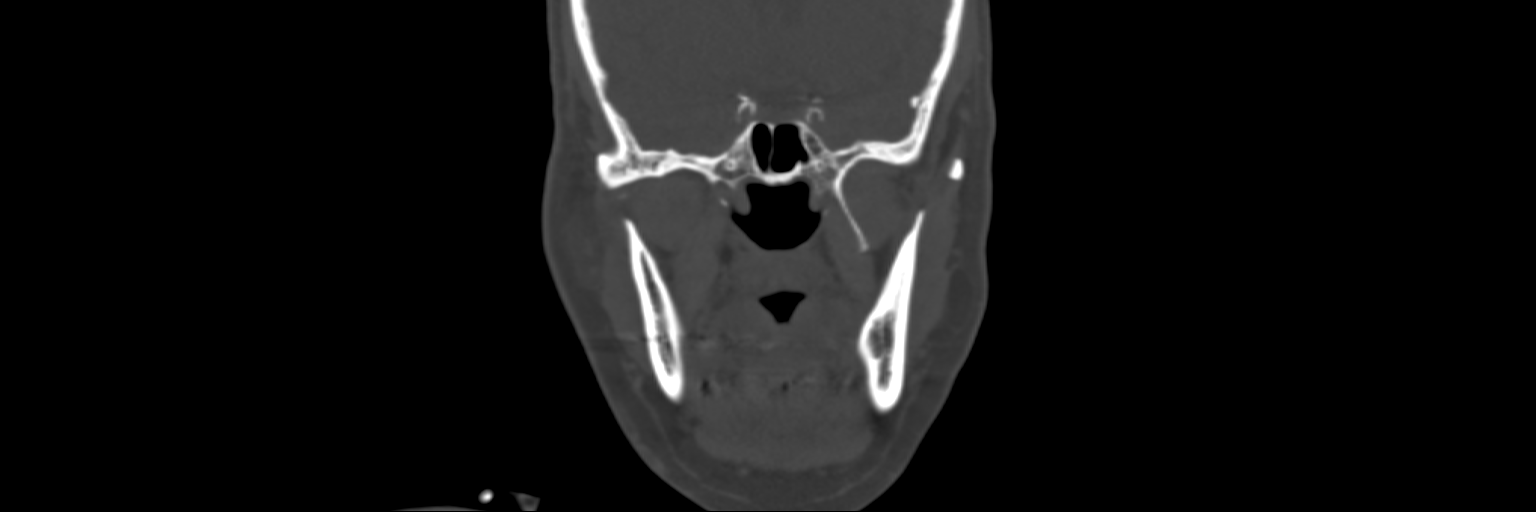
[im 42/75  bone]
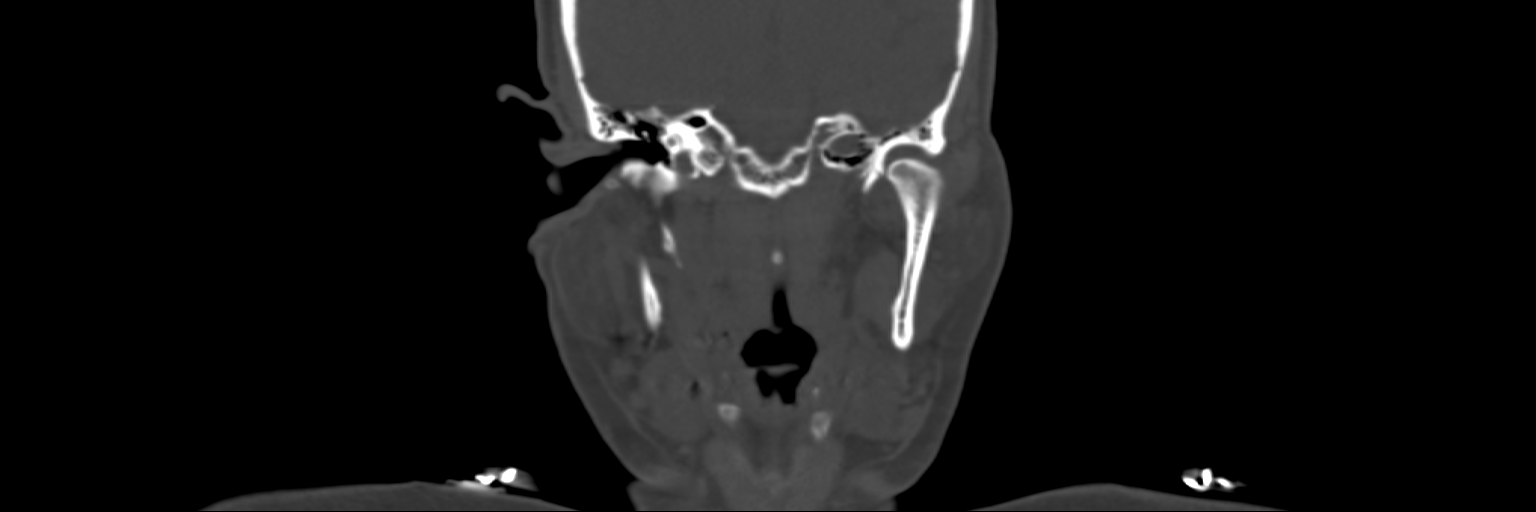

[Series 8: facialbone 2.0 sag st · sagittal · 0.33mm/px · 3 of 76 slices shown]
[im 26/76  bone]
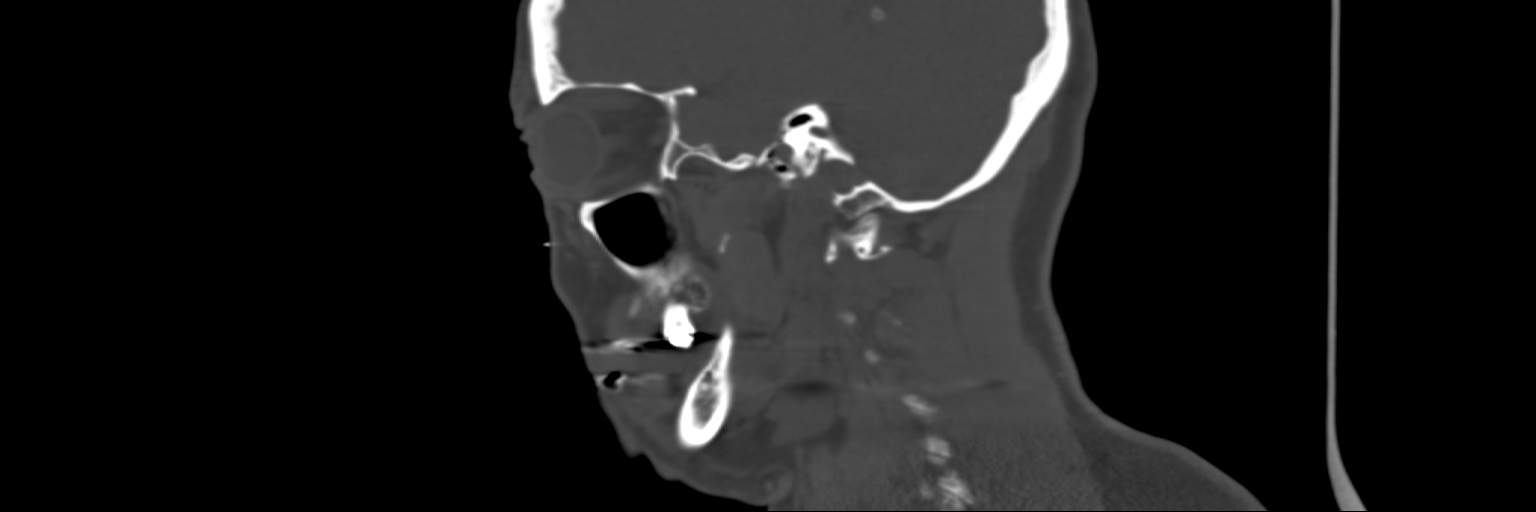
[im 38/76  bone]
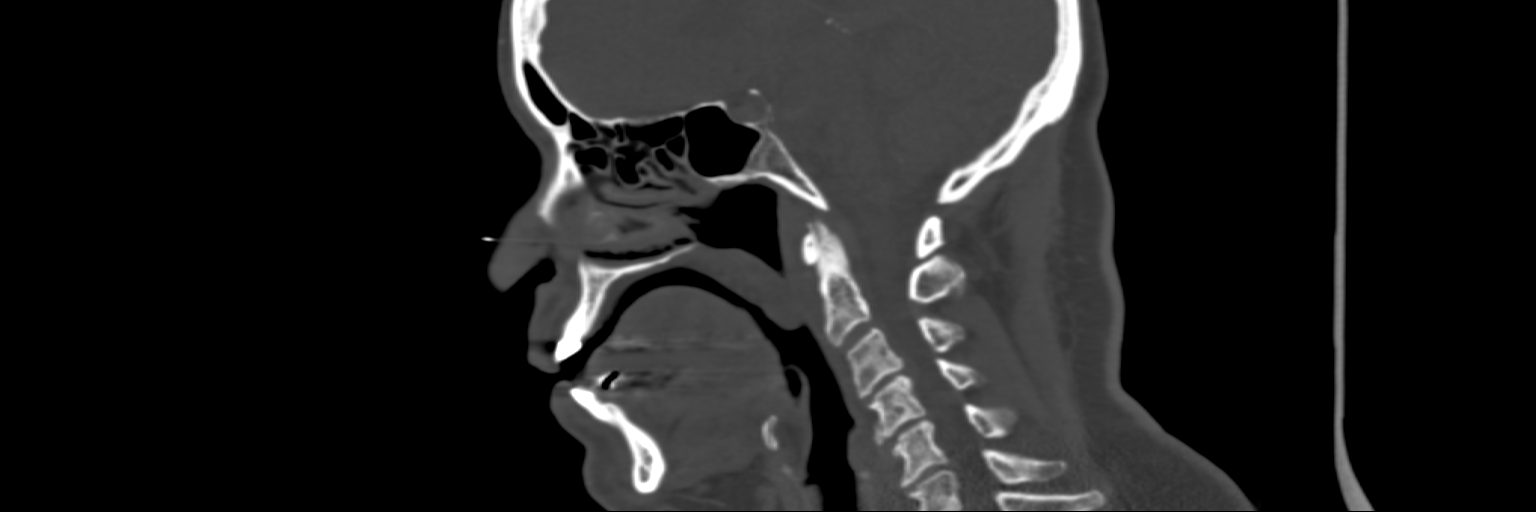
[im 51/76  bone]
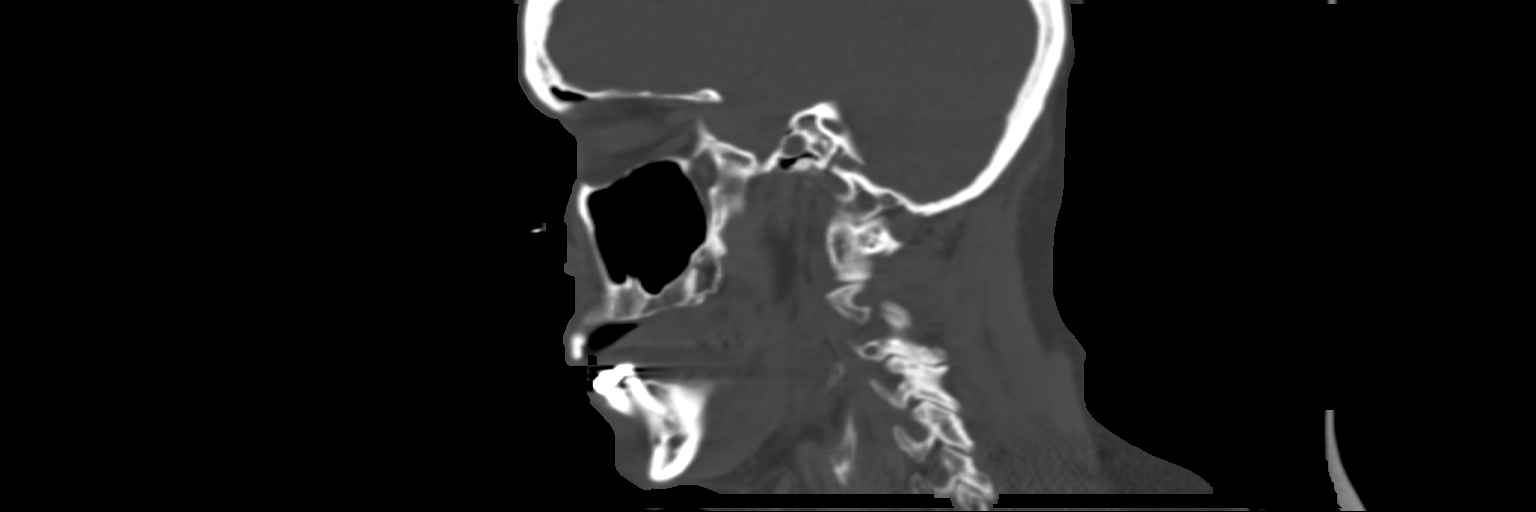

[7 of 47 positions shown; findings below may reference images not displayed]

FINDINGS: Paranasal sinuses:

Frontal: Normally aerated. Patent frontal sinus drainage pathways.

Ethmoid: Normally aerated.

Maxillary: Normally aerated.

Sphenoid: Trace mucosal thickening in the region of the sphenoid
sinus ostia and sphenoethmoidal recesses, otherwise clear
bilaterally. No fluid.

Right ostiomeatal unit: Patent.

Left ostiomeatal unit: Patent.

Nasal passages: Patent. Asymmetric mild right inferior nasal
turbinate hypertrophy. Leftward nasal septal deviation/mild spurring
contacting the left inferior turbinate.

Other: Clear mastoid air cells and tympanic cavities. Bilateral
cataract extraction. Grossly unremarkable included portion of the
brain.
IMPRESSION: No significant paranasal sinus inflammatory disease.

## 2020-07-25 ENCOUNTER — Telehealth: Payer: Self-pay

## 2020-07-25 NOTE — Telephone Encounter (Signed)
1) Medication(s) Requested (by name):levothyroxine (SYNTHROID) 50 MCG tablet [[id number]]    2) Pharmacy of Choice:[id number] - [place], [address] [place] [address]  Phone:  [phone]   3) Special Requests:   Approved medications will be sent to the pharmacy, we will reach out if there is an issue.  Requests made after 3pm may not be addressed until the following business day!  If a patient is unsure of the name of the medication(s) please note and ask patient to call back when they are able to provide all info, do not send to responsible party until all information is available!

## 2020-07-26 MED ORDER — LEVOTHYROXINE SODIUM 50 MCG PO TABS: 50.0000 ug | ORAL_TABLET | Freq: Every day | ORAL | 0 refills | Status: DC

## 2020-08-08 ENCOUNTER — Other Ambulatory Visit: Payer: Self-pay

## 2020-08-08 ENCOUNTER — Ambulatory Visit (INDEPENDENT_AMBULATORY_CARE_PROVIDER_SITE_OTHER): Payer: Medicare Other

## 2020-08-08 DIAGNOSIS — I253 Aneurysm of heart: Secondary | ICD-10-CM | POA: Diagnosis not present

## 2020-08-08 DIAGNOSIS — Z7901 Long term (current) use of anticoagulants: Secondary | ICD-10-CM

## 2020-08-08 LAB — POCT INR: INR: 2.8 (ref 2.0–3.0)

## 2020-08-08 NOTE — Patient Instructions (Signed)
continue taking 1 tablet by mouth daily except take 1.5 tablets Mon, Wed, Fri. Recheck INR in 6 weeks.  

## 2020-08-18 ENCOUNTER — Ambulatory Visit (INDEPENDENT_AMBULATORY_CARE_PROVIDER_SITE_OTHER): Payer: Medicare Other

## 2020-08-18 DIAGNOSIS — I5042 Chronic combined systolic (congestive) and diastolic (congestive) heart failure: Secondary | ICD-10-CM | POA: Diagnosis not present

## 2020-08-18 LAB — CUP PACEART REMOTE DEVICE CHECK
Battery Remaining Longevity: 144 mo
Battery Remaining Percentage: 100 %
Brady Statistic RV Percent Paced: 0 %
Date Time Interrogation Session: 20211209021100
HighPow Impedance: 77 Ohm
Implantable Pulse Generator Implant Date: 20170217
Lead Channel Impedance Value: 981 Ohm
Lead Channel Pacing Threshold Amplitude: 1 V
Lead Channel Pacing Threshold Pulse Width: 0.4 ms
Lead Channel Setting Pacing Amplitude: 2 V
Lead Channel Setting Pacing Pulse Width: 0.4 ms
Lead Channel Setting Sensing Sensitivity: 0.5 mV
Pulse Gen Serial Number: 213123

## 2020-08-25 ENCOUNTER — Telehealth: Payer: Self-pay

## 2020-08-25 DIAGNOSIS — Z794 Long term (current) use of insulin: Secondary | ICD-10-CM

## 2020-08-25 DIAGNOSIS — E1159 Type 2 diabetes mellitus with other circulatory complications: Secondary | ICD-10-CM

## 2020-08-25 MED ORDER — INSULIN LISPRO (1 UNIT DIAL) 100 UNIT/ML (KWIKPEN): 8.0000 [IU] | PEN_INJECTOR | Freq: Three times a day (TID) | SUBCUTANEOUS | 2 refills | Status: DC

## 2020-08-25 MED ORDER — BD PEN NEEDLE MICRO U/F 32G X 6 MM MISC: 5 refills | Status: AC

## 2020-08-25 MED ORDER — HUMALOG MIX 50/50 KWIKPEN (50-50) 100 UNIT/ML ~~LOC~~ SUPN: 26.0000 [IU] | PEN_INJECTOR | Freq: Two times a day (BID) | SUBCUTANEOUS | 5 refills | Status: AC

## 2020-08-25 NOTE — Telephone Encounter (Signed)
1) Medication(s) Requested (by name):HUMALOG MIX 50/50 KWIKPEN (50-50) 100 UNIT/ML Kwikpen   insulin lispro (HUMALOG) 100 UNIT/ML injection  Insulin Pen Needle (BD PEN NEEDLE MICRO U/F) 32G X 6 MM MISC   2) Pharmacy of Choice:[id number] [place], [address] [place] [address]  Phone:  [phone] Fax:  [phone]  3) Special Requests:   Approved medications will be sent to the pharmacy, we will reach out if there is an issue.  Requests made after 3pm may not be addressed until the following business day!  If a patient is unsure of the name of the medication(s) please note and ask patient to call back when they are able to provide all info, do not send to responsible party until all information is available!

## 2020-08-26 ENCOUNTER — Other Ambulatory Visit: Payer: Self-pay | Admitting: Cardiovascular Disease

## 2020-08-30 NOTE — Progress Notes (Signed)
Remote ICD transmission.   

## 2020-09-19 ENCOUNTER — Ambulatory Visit (INDEPENDENT_AMBULATORY_CARE_PROVIDER_SITE_OTHER): Payer: Medicare Other

## 2020-09-19 ENCOUNTER — Other Ambulatory Visit: Payer: Self-pay

## 2020-09-19 DIAGNOSIS — I253 Aneurysm of heart: Secondary | ICD-10-CM

## 2020-09-19 DIAGNOSIS — Z7901 Long term (current) use of anticoagulants: Secondary | ICD-10-CM | POA: Diagnosis not present

## 2020-09-19 LAB — POCT INR: INR: 2.9 (ref 2.0–3.0)

## 2020-09-19 NOTE — Patient Instructions (Signed)
continue taking 1 tablet by mouth daily except take 1.5 tablets Mon, Wed, Fri. Recheck INR in 6 weeks.  

## 2020-10-03 ENCOUNTER — Other Ambulatory Visit: Payer: Self-pay | Admitting: Internal Medicine

## 2020-10-17 ENCOUNTER — Other Ambulatory Visit: Payer: Self-pay

## 2020-10-17 ENCOUNTER — Ambulatory Visit: Payer: Medicare Other | Admitting: Family Medicine

## 2020-10-17 ENCOUNTER — Encounter: Payer: Self-pay | Admitting: Family Medicine

## 2020-10-17 ENCOUNTER — Ambulatory Visit: Payer: Self-pay

## 2020-10-17 DIAGNOSIS — M720 Palmar fascial fibromatosis [Dupuytren]: Secondary | ICD-10-CM

## 2020-10-17 DIAGNOSIS — M6530 Trigger finger, unspecified finger: Secondary | ICD-10-CM | POA: Diagnosis not present

## 2020-10-17 MED ORDER — METHYLPREDNISOLONE ACETATE 40 MG/ML IJ SUSP
20.0000 mg | Freq: Once | INTRAMUSCULAR | Status: AC
  Administered 2020-10-17: 20 mg via INTRA_ARTICULAR

## 2020-10-17 NOTE — Patient Instructions (Signed)
You have depuytren's contracture, a buildup of scar tissue in the palm. Treatment at this point would either be to do hand therapy along with topical voltaren gel up to 4 times a day OR going ahead with steroid injection into the area. Regardless would recommend follow up in 1 month to 6 weeks to evaluate your progress.

## 2020-10-17 NOTE — Assessment & Plan Note (Addendum)
Patient with new dupuytren's contracture over right 4th metacarpal causing mild tenderness and some neuropathic-type pain. US revealed fluid above 4th metacarpal tendon sheath in mid palm. Shared decision making done with patient, discussed options of OT vs CSI vs eventual surgery. Patient opted for CSI at this time. Discussed risks and benefits including small risk for bleeding, infection, patient consented to procedure. After care instructions given, see AVS. Follow up as needed.  After informed written consent timeout was performed, patient was seated in chair in exam room. Right hand was prepped with alcohol swab and utilizing anterior approach with ultrasound guidance, patient's right 4th metacarpal space in the mid palm was injected with 0.5:0.5 lidocaine: depomedrol. Patient tolerated the procedure well without immediate complications.

## 2020-10-17 NOTE — Progress Notes (Signed)
    SUBJECTIVE:   CHIEF COMPLAINT / HPI: right hand pain, f/u right 4th digit trigger finger  Ms. Chrisandra Wiemers is here today for a f/u of Right 4th digit trigger finger. She had CSI in July 2021 with good result: she did not have any trigger finger sx again until approx. October and now only occasionally has locking of that finger, which is still much less than prior to having the injection. However, she also presents with pain in her mid right palm, underneath 4th digit. She notices this when driving and gripping the steering wheel which may touch her mid palm and cause sharp pain and tingling. She also notices that her mid palm is more tender at the end of the day. She has not had problems with gripping.   PERTINENT  PMH / PSH: right 4th digit trigger finger  OBJECTIVE:   BP 140/80   Ht 5\' 8"  (1.727 m)   Wt 180 lb (81.6 kg)   BMI 27.37 kg/m   Nursing note and vitals reviewed GEN: appears younger than age, AAW, resting comfortably in chair, NAD, WNWD HEENT: NCAT. Sclera without injection or icterus.  Right hand: Inspection yielded no erythema, ecchymosis, bony deformity, or edema, however can feel contracture along 4th metacarpal in mid palm. Palpation is tender over 4th and 5th metacarpals. Strength 5/5 in interosseous extension without pain, normal grip strength. Neuro: Alert and at baseline Psych: Pleasant and appropriate   ASSESSMENT/PLAN:   Dupuytren's contracture of right hand Patient with new dupuytren's contracture over right 4th metacarpal causing mild tenderness and some neuropathic-type pain. US revealed fluid above 4th metacarpal tendon sheath in mid palm. Shared decision making done with patient, discussed options of OT vs CSI vs eventual surgery. Patient opted for CSI at this time. Discussed risks and benefits including small risk for bleeding, infection, patient consented to procedure. After care instructions given, see AVS. Follow up as needed.  After informed written  consent timeout was performed, patient was seated on table in exam room. Right hand was prepped with alcohol swab and utilizing ultrasound guidance, connective tissue overlying volar 4th flexor tendons injected with 0.5:0.5 lidocaine: depomedrol. Patient tolerated the procedure well without immediate complications.   Trigger finger, acquired Right 4th trigger finger doing well overall. Due to patient's dupuytren's contracture causing more symptoms today, recommend no treatment at this time. Patient can follow up as needed.     Gladys Damme, MD PGY-2

## 2020-10-17 NOTE — Assessment & Plan Note (Signed)
Right 4th trigger finger doing well overall. Due to patient's dupuytren's contracture causing more symptoms today, recommend no treatment at this time. Patient can follow up as needed.

## 2020-10-31 ENCOUNTER — Other Ambulatory Visit: Payer: Self-pay

## 2020-10-31 ENCOUNTER — Ambulatory Visit (INDEPENDENT_AMBULATORY_CARE_PROVIDER_SITE_OTHER): Payer: Medicare Other

## 2020-10-31 DIAGNOSIS — I253 Aneurysm of heart: Secondary | ICD-10-CM | POA: Diagnosis not present

## 2020-10-31 DIAGNOSIS — Z7901 Long term (current) use of anticoagulants: Secondary | ICD-10-CM

## 2020-10-31 LAB — POCT INR: INR: 2.8 (ref 2.0–3.0)

## 2020-10-31 NOTE — Patient Instructions (Signed)
continue taking 1 tablet by mouth daily except take 1.5 tablets Mon, Wed, Fri. Recheck INR in 6 weeks.

## 2020-11-01 ENCOUNTER — Other Ambulatory Visit: Payer: Self-pay | Admitting: Internal Medicine

## 2020-11-17 ENCOUNTER — Ambulatory Visit (INDEPENDENT_AMBULATORY_CARE_PROVIDER_SITE_OTHER): Payer: Medicare Other

## 2020-11-17 DIAGNOSIS — I5042 Chronic combined systolic (congestive) and diastolic (congestive) heart failure: Secondary | ICD-10-CM | POA: Diagnosis not present

## 2020-11-18 DIAGNOSIS — I2510 Atherosclerotic heart disease of native coronary artery without angina pectoris: Secondary | ICD-10-CM | POA: Diagnosis not present

## 2020-11-18 DIAGNOSIS — Z23 Encounter for immunization: Secondary | ICD-10-CM | POA: Diagnosis not present

## 2020-11-18 DIAGNOSIS — E785 Hyperlipidemia, unspecified: Secondary | ICD-10-CM | POA: Diagnosis not present

## 2020-11-18 DIAGNOSIS — I253 Aneurysm of heart: Secondary | ICD-10-CM | POA: Diagnosis not present

## 2020-11-18 DIAGNOSIS — E1122 Type 2 diabetes mellitus with diabetic chronic kidney disease: Secondary | ICD-10-CM | POA: Diagnosis not present

## 2020-11-18 DIAGNOSIS — Z87891 Personal history of nicotine dependence: Secondary | ICD-10-CM | POA: Diagnosis not present

## 2020-11-18 DIAGNOSIS — I130 Hypertensive heart and chronic kidney disease with heart failure and stage 1 through stage 4 chronic kidney disease, or unspecified chronic kidney disease: Secondary | ICD-10-CM | POA: Diagnosis not present

## 2020-11-19 LAB — CUP PACEART REMOTE DEVICE CHECK
Battery Remaining Longevity: 144 mo
Battery Remaining Percentage: 100 %
Brady Statistic RV Percent Paced: 0 %
Date Time Interrogation Session: 20220310021100
HighPow Impedance: 74 Ohm
Implantable Pulse Generator Implant Date: 20170217
Lead Channel Impedance Value: 927 Ohm
Lead Channel Pacing Threshold Amplitude: 1 V
Lead Channel Pacing Threshold Pulse Width: 0.4 ms
Lead Channel Setting Pacing Amplitude: 2 V
Lead Channel Setting Pacing Pulse Width: 0.4 ms
Lead Channel Setting Sensing Sensitivity: 0.5 mV
Pulse Gen Serial Number: 213123

## 2020-11-25 NOTE — Progress Notes (Signed)
Remote ICD transmission.   

## 2020-11-29 ENCOUNTER — Other Ambulatory Visit: Payer: Self-pay | Admitting: Internal Medicine

## 2020-11-29 DIAGNOSIS — Z794 Long term (current) use of insulin: Secondary | ICD-10-CM

## 2020-11-29 DIAGNOSIS — I10 Essential (primary) hypertension: Secondary | ICD-10-CM

## 2020-11-29 DIAGNOSIS — R809 Proteinuria, unspecified: Secondary | ICD-10-CM

## 2020-11-29 DIAGNOSIS — E1159 Type 2 diabetes mellitus with other circulatory complications: Secondary | ICD-10-CM

## 2020-12-05 NOTE — Telephone Encounter (Signed)
Last received in OCT 21. Pharmacy is requesting a refill and alternate due to not being available at the pharmacy.

## 2020-12-12 ENCOUNTER — Ambulatory Visit (INDEPENDENT_AMBULATORY_CARE_PROVIDER_SITE_OTHER): Payer: Medicare Other

## 2020-12-12 ENCOUNTER — Other Ambulatory Visit: Payer: Self-pay

## 2020-12-12 DIAGNOSIS — Z7901 Long term (current) use of anticoagulants: Secondary | ICD-10-CM

## 2020-12-12 DIAGNOSIS — I253 Aneurysm of heart: Secondary | ICD-10-CM

## 2020-12-12 LAB — POCT INR: INR: 2.5 (ref 2.0–3.0)

## 2020-12-12 NOTE — Patient Instructions (Signed)
continue taking 1 tablet by mouth daily except take 1.5 tablets Mon, Wed, Fri. Recheck INR in 5 weeks.

## 2020-12-25 ENCOUNTER — Other Ambulatory Visit: Payer: Self-pay | Admitting: Internal Medicine

## 2021-01-05 ENCOUNTER — Other Ambulatory Visit: Payer: Self-pay | Admitting: Cardiovascular Disease

## 2021-01-13 ENCOUNTER — Other Ambulatory Visit: Payer: Self-pay | Admitting: Internal Medicine

## 2021-01-13 DIAGNOSIS — E1159 Type 2 diabetes mellitus with other circulatory complications: Secondary | ICD-10-CM

## 2021-01-13 DIAGNOSIS — Z794 Long term (current) use of insulin: Secondary | ICD-10-CM

## 2021-01-16 ENCOUNTER — Other Ambulatory Visit: Payer: Self-pay

## 2021-01-16 ENCOUNTER — Ambulatory Visit (INDEPENDENT_AMBULATORY_CARE_PROVIDER_SITE_OTHER): Payer: Medicare Other | Admitting: Cardiovascular Disease

## 2021-01-16 ENCOUNTER — Ambulatory Visit (INDEPENDENT_AMBULATORY_CARE_PROVIDER_SITE_OTHER): Payer: Medicare Other

## 2021-01-16 ENCOUNTER — Encounter: Payer: Self-pay | Admitting: Cardiovascular Disease

## 2021-01-16 VITALS — BP 128/72 | HR 71 | Ht 67.5 in | Wt 242.0 lb

## 2021-01-16 DIAGNOSIS — Z7901 Long term (current) use of anticoagulants: Secondary | ICD-10-CM

## 2021-01-16 DIAGNOSIS — I253 Aneurysm of heart: Secondary | ICD-10-CM

## 2021-01-16 DIAGNOSIS — I2510 Atherosclerotic heart disease of native coronary artery without angina pectoris: Secondary | ICD-10-CM

## 2021-01-16 DIAGNOSIS — E785 Hyperlipidemia, unspecified: Secondary | ICD-10-CM

## 2021-01-16 DIAGNOSIS — Z6838 Body mass index (BMI) 38.0-38.9, adult: Secondary | ICD-10-CM

## 2021-01-16 DIAGNOSIS — I10 Essential (primary) hypertension: Secondary | ICD-10-CM

## 2021-01-16 DIAGNOSIS — Z95810 Presence of automatic (implantable) cardiac defibrillator: Secondary | ICD-10-CM

## 2021-01-16 DIAGNOSIS — E119 Type 2 diabetes mellitus without complications: Secondary | ICD-10-CM

## 2021-01-16 DIAGNOSIS — I5042 Chronic combined systolic (congestive) and diastolic (congestive) heart failure: Secondary | ICD-10-CM | POA: Diagnosis not present

## 2021-01-16 LAB — POCT INR: INR: 2.9 (ref 2.0–3.0)

## 2021-01-16 MED ORDER — SACUBITRIL-VALSARTAN 49-51 MG PO TABS: 1.0000 | ORAL_TABLET | Freq: Two times a day (BID) | ORAL | 1 refills | Status: DC

## 2021-01-16 NOTE — Patient Instructions (Signed)
Medication Instructions:  STOP the Losartan  START Entresto 49-51 mg twice daily  *If you need a refill on your cardiac medications before your next appointment, please call your pharmacy*   Lab Work: Your provider would like for you to return in one month at your pharmd appointment to have the following labs drawn: BMET. You do not need an appointment for the lab. Once in our office lobby there is a podium where you can sign in and ring the doorbell to alert [place] that you are here. The lab is open from 8:00 am to 4:30 pm; closed for lunch from 12:45pm-1:45pm.  If you have labs (blood work) drawn today and your tests are completely normal, you will receive your results only by: Marland Kitchen MyChart Message (if you have MyChart) OR . A paper copy in the mail If you have any lab test that is abnormal or we need to change your treatment, we will call you to review the results.   Testing/Procedures: None ordered   Follow-Up: At [facility], you and your health needs are our priority.  As part of our continuing mission to provide you with exceptional heart care, we have created designated Provider Care Teams.  These Care Teams include your primary Cardiologist (physician) and Advanced Practice Providers (APPs -  Physician Assistants and Nurse Practitioners) who all work together to provide you with the care you need, when you need it.  We recommend signing up for the patient portal called "MyChart".  Sign up information is provided on this After Visit Summary.  MyChart is used to connect with patients for Virtual Visits (Telemedicine).  Patients are able to view lab/test results, encounter notes, upcoming appointments, etc.  Non-urgent messages can be sent to your provider as well.   To learn more about what you can do with MyChart, go to [email or web address].    Your next appointment:   Follow up in one month with pharmD for medication titration Follow up in 6 months on a device day with Dr.  Sallyanne Kuster.

## 2021-01-16 NOTE — Patient Instructions (Signed)
continue taking 1 tablet by mouth daily except take 1.5 tablets Mon, Wed, Fri. Recheck INR in 6 weeks.  

## 2021-01-16 NOTE — Progress Notes (Signed)
Cardiology Office Note:    Date:  01/17/2021   ID:  Desiree Harrison, DOB 12-26-1945, MRN [id number]  PCP:  Michael Boston, MD  Cardiologist:  Sanda Klein, MD  Electrophysiologist:  None   Referring MD: Caryl Never*   Chief Complaint  Patient presents with  . Congestive Heart Failure  . Coronary Artery Disease       . ICD chesk    History of Present Illness:    Desiree Harrison is a 75 y.o. 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  She is on chronic warfarin anticoagulation.  She also has diabetes mellitus requiring insulin, complicated by neuropathy, hypertension and hypercholesterolemia, history of gout.  To date, she has not received therapy from her defibrillator.    She had previously seen Dr. Sabino Donovan at [facility], most recent visit in August 2019.  Her initial myocardial infarction was in November 2016.  She did not have angina pectoris, but her myocardial infarction manifested as nausea and vomiting shortness of breath.  She presented late and she was found to have a persistently occluded LAD that was not amenable to PCI.  There were no other meaningful coronary stenoses.  She had an aneurysmal apex.  Roughly 3 months later she underwent implantation of a defibrillator(Dr. Remus Blake, single-chamber [place] serial [id number], right ventricular lead [id number], DFT testing was not performed).  She has not required hospitalizations for heart failure as far as I can tell has never had documented LV apical thrombus or embolic events, but there was considerable concern about this  possibility because of the aneurysmal apex.  She has noticed that the majority of her left arm is larger than the right.  She has not had any lumps or tenderness in her breast and has never had breast cancer surgery.  There is no lymphadenopathy in her axilla.  She is felt well since her last appointment and denies any problems with exertional angina or dyspnea, lower extremity edema, palpitations, dizziness, syncope, defibrillator discharges, focal neurological events, falls/injuries or bleeding problems.  She does not have orthopnea or PND.  She started treatment with Trulicity for her diabetes mellitus and is having a variety of GI side effects.    Comprehensive defibrillator interrogation shows normal device function.  Battery longevity is estimated at 12 years and lead parameters remain excellent.  She has not had any episodes of high ventricular rate and does not require ventricular pacing.  Her last episode of nonsustained VT was in December 2020.  Programmed as a "shock box" with therapy beginning only at 200 bpm, but left a monitor zone at 170 bpm.  She has a history of cough with lisinopril.  She was intolerant to [place].  Past Medical History:  Diagnosis Date  . Acute systolic heart failure ([place])   . CAD in native artery   . Cardiomyopathy ([place])   . Gout   . Long term (current) use of anticoagulants   . S/P internal cardiac defibrillator procedure    placed 10/28/15  . STEMI (ST elevation myocardial infarction) ([place])   . Type 2 diabetes mellitus, with long-term current use of insulin ([place])   .  Ventricular aneurysm     Past Surgical History:  Procedure Laterality Date  . ABDOMINAL HYSTERECTOMY    . APPENDECTOMY    . BREAST BIOPSY Left   . CARDIAC CATHETERIZATION    . CHOLECYSTECTOMY    . VIDEO BRONCHOSCOPY WITH ENDOBRONCHIAL ULTRASOUND N/A 01/20/2019   Procedure: VIDEO BRONCHOSCOPY WITH BIOPSY;  Surgeon: Marshell Garfinkel, MD;  Location: [facility];  Service: Pulmonary;  Laterality:  N/A;    Current Medications: Current Meds  Medication Sig  . amLODipine (NORVASC) 5 MG tablet TAKE 1 TABLET BY MOUTH EVERY DAY  . aspirin EC 81 MG tablet Take 81 mg by mouth daily.  Marland Kitchen atorvastatin (LIPITOR) 80 MG tablet TAKE 1 TABLET BY MOUTH EVERY DAY  . Blood Glucose Monitoring Suppl (ONETOUCH VERIO) w/Device KIT Use to check FSBS three times a day. Dx: E11.59, Z79.4  . carvedilol (COREG) 25 MG tablet Take 1 tablet (25 mg total) by mouth 2 (two) times daily with a meal.  . gabapentin (NEURONTIN) 100 MG capsule Take 2 capsules (200 mg total) by mouth 2 (two) times daily.  Marland Kitchen glucose blood (ONETOUCH VERIO) test strip Use to check FSBS three times a day. Dx: E11.59, Z79.4  . HUMALOG MIX 50/50 KWIKPEN (50-50) 100 UNIT/ML Kwikpen Inject 26 Units into the skin 2 (two) times daily. Dx: E11.59, Z79.4  . insulin lispro (HUMALOG KWIKPEN) 100 UNIT/ML KwikPen Inject 8 Units into the skin 3 (three) times daily before meals.  . Insulin Pen Needle (BD PEN NEEDLE MICRO U/F) 32G X 6 MM MISC Use twice a day with Humalog Mix Kwikpen. Dx: E11.59, Z79.4  . Insulin Syringe-Needle U-100 (INSULIN SYRINGE .3CC/31GX5/16") 31G X 5/16" 0.3 ML MISC Use TID to inject Humalog insulin. Dx: E11.59, Z79.4  . Lancet Devices (ONE TOUCH DELICA LANCING DEV) MISC Use to check FSBS three times a day. Dx: E11.59, Z79.4  . levothyroxine (SYNTHROID) 50 MCG tablet TAKE 1 TABLET BY MOUTH EVERY DAY  . omeprazole (PRILOSEC) 20 MG capsule Take 30- 60 min before your first and last meals of the day  . OneTouch Delica Lancets 29V MISC Use to check FSBS three times a day. Dx: E11.59, Z79.4  . sacubitril-valsartan (ENTRESTO) 49-51 MG Take 1 tablet by mouth 2 (two) times daily.  . traMADol (ULTRAM) 50 MG tablet Take 50 mg by mouth every 6 (six) hours as needed.  . TRULICITY 1.5 BT/6.6MA SOPN Inject into the skin.  Marland Kitchen warfarin (COUMADIN) 3 MG tablet TAKE 1 TO 1.5 TABLETS BY MOUTH DAILY AS DIRECTED BY COUMADIN CLINIC  . [DISCONTINUED] losartan  (COZAAR) 50 MG tablet TAKE 1 TABLET BY MOUTH EVERY DAY     Allergies:   Lisinopril   Social History   Socioeconomic History  . Marital status: Widowed    Spouse name: Not on file  . Number of children: Not on file  . Years of education: Not on file  . Highest education level: Not on file  Occupational History  . Not on file  Tobacco Use  . Smoking status: Former Smoker    Packs/day: 0.25    Types: Cigarettes    Quit date: 12/30/1963    Years since quitting: 57.0  . Smokeless tobacco: Never Used  . Tobacco comment: pt smoked 5-6/cigs daily 12/30/2018  Substance and Sexual Activity  . Alcohol use: Not Currently  . Drug use: Never  . Sexual activity: Not on file  Other Topics Concern  . Not on file  Social History Narrative  . Not on file  Social Determinants of Health   Financial Resource Strain: Not on file  Food Insecurity: Not on file  Transportation Needs: Not on file  Physical Activity: Not on file  Stress: Not on file  Social Connections: Not on file     Family History: The patient's family history includes Breast cancer in her mother; Coronary artery disease in her mother; Diabetes in her mother; Stroke in her father.  The patient's mother had bypass surgery at age 74 but lived to be 75 years old.  Patient's grandmother died with a myocardial infarction at age 29.  ROS:   Please see the history of present illness.     All other systems are reviewed and are negative.   EKGs/Labs/Other Studies Reviewed:     EKG:  EKG is  ordered today.  Shows normal sinus rhythm with Q waves from V1 to V3 and T wave inversion V3-V6, unchanged from previous tracing.  Normal QTC 391 ms.  There may be LVH, with voltage masked by obesity.  Recent Labs: 06/13/2020: ALT 13; BUN 17; Creatinine, Ser 1.06; Hemoglobin 14.3; Platelets 214; Potassium 4.2; Sodium 147   11/18/2020 Hemoglobin A1c 7.7%, potassium 5.2, normal liver function test, TSH 3.79, INR 2.50  Recent Lipid Panel     Component Value Date/Time   CHOL 104 03/10/2020 0958   TRIG 125 03/10/2020 0958   HDL 31 (L) 03/10/2020 0958   CHOLHDL 3.4 03/10/2020 0958   LDLCALC 50 03/10/2020 0958  11/18/2020 Cholesterol 109, HDL 29, LDL 56, triglycerides 118  Physical Exam:    VS:  BP 128/72   Pulse 71   Ht 5' 7.5" (1.715 m)   Wt 242 lb (109.8 kg)   SpO2 97%   BMI 37.34 kg/m   Wt Readings from Last 3 Encounters:  01/16/21 242 lb (109.8 kg)  10/17/20 180 lb (81.6 kg)  05/09/20 185 lb (83.9 kg)    General: Alert, oriented x3, no distress, severely obese.  Healthy subclavian defibrillator site Head: no evidence of trauma, PERRL, EOMI, no exophtalmos or lid lag, no myxedema, no xanthelasma; normal ears, nose and oropharynx Neck: normal jugular venous pulsations and no hepatojugular reflux; brisk carotid pulses without delay and no carotid bruits Chest: clear to auscultation, no signs of consolidation by percussion or palpation, normal fremitus, symmetrical and full respiratory excursions Cardiovascular: normal position and quality of the apical impulse, regular rhythm, normal first and second heart sounds, no murmurs, rubs or gallops Abdomen: no tenderness or distention, no masses by palpation, no abnormal pulsatility or arterial bruits, normal bowel sounds, no hepatosplenomegaly Extremities: no clubbing, cyanosis or edema; 2+ radial, ulnar and brachial pulses bilaterally; 2+ right femoral, posterior tibial and dorsalis pedis pulses; 2+ left femoral, posterior tibial and dorsalis pedis pulses; no subclavian or femoral bruits Neurological: grossly nonfocal Psych: Normal mood and affect    ASSESSMENT:    1. Chronic combined systolic and diastolic heart failure ([place])   2. Coronary artery disease involving native coronary artery of native heart without angina pectoris   3. Left ventricular aneurysm   4. Long term (current) use of anticoagulants   5. ICD (implantable cardioverter-defibrillator) in place   6.  Dyslipidemia (high LDL; low HDL)   7. Controlled type 2 diabetes mellitus without complication, without long-term current use of insulin ([place])   8. Essential hypertension   9. Class 2 severe obesity due to excess calories with serious comorbidity and body mass index (BMI) of 38.0 to 38.9 in adult [facility])    PLAN:  In order of problems listed above:  1. CHF: Very well compensated, NYHA functional class I.  On a good dose of carvedilol and a moderate dose of losartan.  We will try to transition to [facility] for improved long-term prognosis.  Stop losartan and start Entresto 49/51 mg twice daily.  Reevaluate in about a month to see if there is room to increase to the maximum Entresto dose after we recheck her labs.  She does not require diuretics. 2. CAD: She denies angina pectoris.  Note that she never had angina before her myocardial infarction but presented with nausea, vomiting, dyspnea.  On aspirin and statin. 3. LV aneurysm: She has not had confirmed thromboembolic events. 4. Warfarin: No bleeding complications.  INR is consistently in therapeutic range (last supratherapeutic level at 3.6 was last November and last subtherapeutic at 1.7 was last August) tolerating well, without bleeding complications although she also takes low-dose aspirin.  Has been on this combination now for several years without bleeding issues. 5. ICD: Normal device function.  No evidence of sustained or nonsustained VT since 2020.  Continue remote downloads every 3 months. 6. HLP: Continues to have an excellent LDL on statin therapy, but HDL remains low and is unlikely to improve without substantial weight loss and increase physical exercise.  This was discussed. 7. DM: Notes report that she was intolerant to [place], although an SGLT2 inhibitor would be a good choice in a patient with depressed EF from coronary disease.  GLP-1 agonist is causing some GI problems.  Glycemic control is not terrible, but also not optimal.   Avoid Actos. 8. HTN: Adequate control. 9. Severe obesity: strongly recommend weight loss.  I was hoping that the GLP-1 agonist would help.   Medication Adjustments/Labs and Tests Ordered: Current medicines are reviewed at length with the patient today.  Concerns regarding medicines are outlined above.  Orders Placed This Encounter  Procedures  . Basic metabolic panel  . EKG 12-Lead   Meds ordered this encounter  Medications  . sacubitril-valsartan (ENTRESTO) 49-51 MG    Sig: Take 1 tablet by mouth 2 (two) times daily.    Dispense:  60 tablet    Refill:  1    Patient Instructions  Medication Instructions:  STOP the Losartan  START Entresto 49-51 mg twice daily  *If you need a refill on your cardiac medications before your next appointment, please call your pharmacy*   Lab Work: Your provider would like for you to return in one month at your pharmd appointment to have the following labs drawn: BMET. You do not need an appointment for the lab. Once in our office lobby there is a podium where you can sign in and ring the doorbell to alert [place] that you are here. The lab is open from 8:00 am to 4:30 pm; closed for lunch from 12:45pm-1:45pm.  If you have labs (blood work) drawn today and your tests are completely normal, you will receive your results only by: Marland Kitchen MyChart Message (if you have MyChart) OR . A paper copy in the mail If you have any lab test that is abnormal or we need to change your treatment, we will call you to review the results.   Testing/Procedures: None ordered   Follow-Up: At [facility], you and your health needs are our priority.  As part of our continuing mission to provide you with exceptional heart care, we have created designated Provider Care Teams.  These Care Teams include your primary Cardiologist (physician) and Advanced  Practice Providers (APPs -  Physician Assistants and Nurse Practitioners) who all work together to provide you with the care you  need, when you need it.  We recommend signing up for the patient portal called "MyChart".  Sign up information is provided on this After Visit Summary.  MyChart is used to connect with patients for Virtual Visits (Telemedicine).  Patients are able to view lab/test results, encounter notes, upcoming appointments, etc.  Non-urgent messages can be sent to your provider as well.   To learn more about what you can do with MyChart, go to [email or web address].    Your next appointment:   Follow up in one month with pharmD for medication titration Follow up in 6 months on a device day with Dr. Sallyanne Kuster.      Signed, Sanda Klein, MD  01/17/2021 9:40 AM    [facility]

## 2021-01-17 ENCOUNTER — Encounter: Payer: Self-pay | Admitting: Cardiovascular Disease

## 2021-01-30 ENCOUNTER — Other Ambulatory Visit: Payer: Self-pay | Admitting: Internal Medicine

## 2021-02-16 ENCOUNTER — Other Ambulatory Visit: Payer: Self-pay

## 2021-02-16 ENCOUNTER — Ambulatory Visit (INDEPENDENT_AMBULATORY_CARE_PROVIDER_SITE_OTHER): Payer: Medicare Other | Admitting: Pharmacist Clinician (PhC)/ Clinical Pharmacy Specialist

## 2021-02-16 ENCOUNTER — Ambulatory Visit (INDEPENDENT_AMBULATORY_CARE_PROVIDER_SITE_OTHER): Payer: Medicare Other

## 2021-02-16 DIAGNOSIS — I5042 Chronic combined systolic (congestive) and diastolic (congestive) heart failure: Secondary | ICD-10-CM

## 2021-02-16 LAB — CUP PACEART REMOTE DEVICE CHECK
Battery Remaining Longevity: 144 mo
Battery Remaining Percentage: 100 %
Brady Statistic RV Percent Paced: 0 %
Date Time Interrogation Session: 20220609021100
HighPow Impedance: 79 Ohm
Implantable Pulse Generator Implant Date: 20170217
Lead Channel Impedance Value: 902 Ohm
Lead Channel Pacing Threshold Amplitude: 0.7 V
Lead Channel Pacing Threshold Pulse Width: 0.4 ms
Lead Channel Setting Pacing Amplitude: 2 V
Lead Channel Setting Pacing Pulse Width: 0.4 ms
Lead Channel Setting Sensing Sensitivity: 0.5 mV
Pulse Gen Serial Number: 213123

## 2021-02-16 LAB — BASIC METABOLIC PANEL
BUN/Creatinine Ratio: 15 (ref 12–28)
BUN: 16 mg/dL (ref 8–27)
CO2: 21 mmol/L (ref 20–29)
Calcium: 9.1 mg/dL (ref 8.7–10.3)
Chloride: 109 mmol/L — ABNORMAL HIGH (ref 96–106)
Creatinine, Ser: 1.09 mg/dL — ABNORMAL HIGH (ref 0.57–1.00)
Glucose: 101 mg/dL — ABNORMAL HIGH (ref 65–99)
Potassium: 4.8 mmol/L (ref 3.5–5.2)
Sodium: 145 mmol/L — ABNORMAL HIGH (ref 134–144)
eGFR: 54 mL/min/{1.73_m2} — ABNORMAL LOW (ref >59)

## 2021-02-16 NOTE — Progress Notes (Signed)
02/16/2021 Desiree Harrison [birth year] [id number]   HPI:  Desiree Harrison is a 75 y.o. female patient of Dr Sallyanne Kuster, with a PMH below who presents today for heart failure medication titration.   Patient has chronic systolic failure with EF as low as 25-30% in the past, most recently (2018) at 50%.  At her visit with Dr. Sallyanne Kuster last month her pressure was good at 128/72 on losartan 50 mg.  To help improve long term prognosis, the losartan was switched to Entresto 49/51 mg.  She is not currently on spironolactone, and she notes that she was unable to tolerate [place] because of increased urination.    Today she returns for follow up.   She is feeling well overall, no problems with her current medications, although she is in the coverage gap and the prices are significantly higher.    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    Home BP readings: has home cuff, no readings with her today  Intolerances: lisinopril caused cough, Farxiga caused excessive urination  Labs: 10/21 - Na 147, K 4.2, Glu 140, BUN 17, SCr 1.06, GFR 60   Wt Readings from Last 3 Encounters:  02/16/21 238 lb (108 kg)  01/16/21 242 lb (109.8 kg)  10/17/20 180 lb (81.6 kg)   BP Readings from Last 3 Encounters:  02/16/21 116/70  01/16/21 128/72  10/17/20 140/80   Pulse Readings from Last 3 Encounters:  02/16/21 79  01/16/21 71  03/10/20 64    Current Outpatient Medications  Medication Sig Dispense Refill   amLODipine (NORVASC) 5 MG tablet TAKE 1 TABLET BY MOUTH EVERY DAY  90 tablet 3   aspirin EC 81 MG tablet Take 81 mg by mouth daily.     atorvastatin (LIPITOR) 80 MG tablet TAKE 1 TABLET BY MOUTH EVERY DAY 90 tablet 3   Blood Glucose Monitoring Suppl (ONETOUCH VERIO) w/Device KIT Use to check FSBS three times a day. Dx: E11.59, Z79.4 1 kit 0   carvedilol (COREG) 25 MG tablet Take 1 tablet (25 mg total) by mouth 2 (two) times daily with a meal. 180 tablet 1   gabapentin (NEURONTIN) 100 MG capsule Take 2 capsules (200 mg total) by mouth 2 (two) times daily. 120 capsule 5   glucose blood (ONETOUCH VERIO) test strip Use to check FSBS three times a day. Dx: E11.59, Z79.4 100 each 2   HUMALOG MIX 50/50 KWIKPEN (50-50) 100 UNIT/ML Kwikpen Inject 26 Units into the skin 2 (two) times daily. Dx: E11.59, Z79.4 15 mL 5   insulin lispro (HUMALOG KWIKPEN) 100 UNIT/ML KwikPen Inject 8 Units into the skin 3 (three) times daily before meals. 15 mL 2   Insulin Pen Needle (BD PEN NEEDLE MICRO U/F) 32G X 6 MM MISC Use twice a day with Humalog Mix Kwikpen. Dx: E11.59, Z79.4 100 each 5   Insulin Syringe-Needle U-100 (INSULIN SYRINGE .3CC/31GX5/16") 31G X 5/16" 0.3 ML MISC Use TID to inject Humalog insulin. Dx: E11.59, Z79.4 100 each 5   Lancet Devices (  ONE TOUCH DELICA LANCING DEV) MISC Use to check FSBS three times a day. Dx: E11.59, Z79.4 1 each 0   levothyroxine (SYNTHROID) 50 MCG tablet TAKE 1 TABLET BY MOUTH EVERY DAY 30 tablet 1   omeprazole (PRILOSEC) 20 MG capsule Take 30- 60 min before your first and last meals of the day     sacubitril-valsartan (ENTRESTO) 49-51 MG Take 1 tablet by mouth 2 (two) times daily. 60 tablet 1   traMADol (ULTRAM) 50 MG tablet Take 50 mg by mouth every 6 (six) hours as needed.     TRULICITY 1.5 QD/8.2ME SOPN Inject into the skin.     warfarin (COUMADIN) 3 MG tablet TAKE 1 TO 1.5 TABLETS BY MOUTH DAILY AS DIRECTED BY COUMADIN CLINIC 120 tablet 0   No current facility-administered medications for this visit.    Allergies  Allergen Reactions    Lisinopril Cough    Past Medical History:  Diagnosis Date   Acute systolic heart failure (HCC)    CAD in native artery    Cardiomyopathy ([place])    Gout    Long term (current) use of anticoagulants    S/P internal cardiac defibrillator procedure    placed 10/28/15   STEMI (ST elevation myocardial infarction) ([place])    Type 2 diabetes mellitus, with long-term current use of insulin (HCC)    Ventricular aneurysm     Blood pressure 116/70, pulse 79, resp. rate 15, height '5\' 7"'  (1.702 m), weight 238 lb (108 kg), SpO2 93 %.  Chronic combined systolic and diastolic heart failure [facility]) Patient with CHF currently on Entresto and carvedilol.  We discussed getting on GDMT at length.  She has previously not done well with Wilder Glade, but is willing to give Jardiance a try.  Will have to wait for her labs to result later today or tomorrow.  Would ideally like to add spironolactone 12.5 mg starting with every other day to be sure it does not bottom out her BP.  She is currently on amlodipine 5 mg, which we could discontinue, giving room to increase the Entresto to 97/103 mg dose. Currently she is concerned about the medication prices, as with Trulicity and insulin, she is already in the coverage gap.  Delene Loll is costing her about $130 per month, and while she is getting by, it does make it difficult.  She was given paperwork to apply for patient assistance for both Entresto and Jardiance.  We are in agreement that if she is not able to get assistance with Jardiance, we will discontinue that medication.   She will bring that paperwork back when she comes for her next INR check in the coumadin clinic.  She will also record some home BP readings and bring that information as well, so we can determine how much to up-titrate Entresto and spironolactone.     Tommy Medal PharmD CPP [facility] [address] [place], [place] [address] [phone]

## 2021-02-16 NOTE — Assessment & Plan Note (Signed)
Patient with CHF currently on Entresto and carvedilol.  We discussed getting on GDMT at length.  She has previously not done well with Wilder Glade, but is willing to give Jardiance a try.  Will have to wait for her labs to result later today or tomorrow.  Would ideally like to add spironolactone 12.5 mg starting with every other day to be sure it does not bottom out her BP.  She is currently on amlodipine 5 mg, which we could discontinue, giving room to increase the Entresto to 97/103 mg dose. Currently she is concerned about the medication prices, as with Trulicity and insulin, she is already in the coverage gap.  Delene Loll is costing her about $130 per month, and while she is getting by, it does make it difficult.  She was given paperwork to apply for patient assistance for both Entresto and Jardiance.  We are in agreement that if she is not able to get assistance with Jardiance, we will discontinue that medication.   She will bring that paperwork back when she comes for her next INR check in the coumadin clinic.  She will also record some home BP readings and bring that information as well, so we can determine how much to up-titrate Entresto and spironolactone.

## 2021-02-16 NOTE — Patient Instructions (Signed)
  Go to the lab today to check kidney function and electrolytes  Check your blood pressure at home daily and keep record of the readings.  Take your BP meds as follows:  Continue medications.  We will call you when the blood work comes back.  Ideally we will add spironolactone 12.5 mg (1/2 tablet) every other day.  Start Jardiance samples.  If you tolerate this, please bring back the patient assistance forms.    Fill out the paperwork for assistance with Delene Loll and return to the office Lattie Haw - Dr. Lurline Del nurse)  Bring all of your meds, your BP cuff and your record of home blood pressures to your next appointment.  Exercise as you're able, try to walk approximately 30 minutes per day.  Keep salt intake to a minimum, especially watch canned and prepared boxed foods.  Eat more fresh fruits and vegetables and fewer canned items.  Avoid eating in fast food restaurants.    HOW TO TAKE YOUR BLOOD PRESSURE: Rest 5 minutes before taking your blood pressure.  Don't smoke or drink caffeinated beverages for at least 30 minutes before. Take your blood pressure before (not after) you eat. Sit comfortably with your back supported and both feet on the floor (don't cross your legs). Elevate your arm to heart level on a table or a desk. Use the proper sized cuff. It should fit smoothly and snugly around your bare upper arm. There should be enough room to slip a fingertip under the cuff. The bottom edge of the cuff should be 1 inch above the crease of the elbow. Ideally, take 3 measurements at one sitting and record the average.

## 2021-03-02 ENCOUNTER — Telehealth: Payer: Self-pay

## 2021-03-02 NOTE — Telephone Encounter (Signed)
Lmom for overdue inr 

## 2021-03-10 NOTE — Progress Notes (Signed)
Remote ICD transmission.   

## 2021-03-15 ENCOUNTER — Telehealth: Payer: Self-pay | Admitting: Pharmacist Clinician (PhC)/ Clinical Pharmacy Specialist

## 2021-03-15 ENCOUNTER — Other Ambulatory Visit: Payer: Self-pay

## 2021-03-15 ENCOUNTER — Ambulatory Visit (INDEPENDENT_AMBULATORY_CARE_PROVIDER_SITE_OTHER): Payer: Medicare Other

## 2021-03-15 DIAGNOSIS — I253 Aneurysm of heart: Secondary | ICD-10-CM

## 2021-03-15 DIAGNOSIS — Z7901 Long term (current) use of anticoagulants: Secondary | ICD-10-CM

## 2021-03-15 LAB — POCT INR: INR: 2.9 (ref 2.0–3.0)

## 2021-03-15 NOTE — Telephone Encounter (Signed)
Patient in office for INR check today.  Did not bring back patient assistance paperwork for Entresto or Jardiance.  Stressed that we need to get that back in order to keep her on therapy.  She will try to get that back to [place] this week.  In the meantime, she is out of medication, so gave sample of the 24/26 mg dose.

## 2021-03-15 NOTE — Patient Instructions (Signed)
continue taking 1 tablet by mouth daily except take 1.5 tablets Mon, Wed, Fri. Recheck INR in 6 weeks.

## 2021-03-23 ENCOUNTER — Telehealth: Payer: Self-pay | Admitting: *Deleted

## 2021-03-23 NOTE — Telephone Encounter (Signed)
Assistance faxed for [employer].  [employer]: [phone] Desiree Harrison) [employer]: [phone] (Jardiance 10 mg)

## 2021-03-29 NOTE — Telephone Encounter (Signed)
Assistance has been approved for both medications through the end of the year.

## 2021-04-06 ENCOUNTER — Other Ambulatory Visit: Payer: Self-pay | Admitting: Internal Medicine

## 2021-04-06 ENCOUNTER — Other Ambulatory Visit: Payer: Self-pay | Admitting: Cardiovascular Disease

## 2021-04-06 DIAGNOSIS — Z1231 Encounter for screening mammogram for malignant neoplasm of breast: Secondary | ICD-10-CM

## 2021-04-07 ENCOUNTER — Other Ambulatory Visit: Payer: Self-pay | Admitting: Cardiovascular Disease

## 2021-04-11 ENCOUNTER — Ambulatory Visit
Admission: RE | Admit: 2021-04-11 | Discharge: 2021-04-11 | Disposition: A | Discharge status: Home / Self Care | Payer: Medicare Other | Source: Ambulatory Visit | Attending: Internal Medicine | Admitting: Internal Medicine

## 2021-04-11 ENCOUNTER — Other Ambulatory Visit: Payer: Self-pay

## 2021-04-11 DIAGNOSIS — Z1231 Encounter for screening mammogram for malignant neoplasm of breast: Secondary | ICD-10-CM

## 2021-04-20 ENCOUNTER — Telehealth: Payer: Self-pay | Admitting: Cardiovascular Disease

## 2021-04-20 NOTE — Telephone Encounter (Signed)
Called and lmom pt that we need to change both appts to the same day to see a pharmd for entresto titration as well as coumadin inr check. Will await callback from the pt. Karren Cobble is the only pharmd there that day and will be in a mtg from 9-11. Will route to nl anticoag pool to keep trying to contact to reschedule.

## 2021-04-20 NOTE — Telephone Encounter (Signed)
Pt is calling to confirm that she needs come to both appts

## 2021-04-20 NOTE — Telephone Encounter (Signed)
Spoke with the patient. She stated that she got a call about needing to change her appointment times with pharmd and coumadin on 8/17 due to a scheduling conflict. She has an appointment at 9:30 and 10:30.   Message has been sent to them.

## 2021-04-20 NOTE — Telephone Encounter (Signed)
Pt returned the call and we were able to coordinate both appts on the same day and the pt voiced understanding

## 2021-04-26 ENCOUNTER — Ambulatory Visit: Payer: Medicare Other

## 2021-05-03 ENCOUNTER — Ambulatory Visit (INDEPENDENT_AMBULATORY_CARE_PROVIDER_SITE_OTHER): Payer: Medicare Other | Admitting: Pharmacist

## 2021-05-03 ENCOUNTER — Other Ambulatory Visit: Payer: Self-pay

## 2021-05-03 VITALS — BP 114/58 | HR 74 | Resp 14 | Ht 67.0 in | Wt 228.0 lb

## 2021-05-03 DIAGNOSIS — I253 Aneurysm of heart: Secondary | ICD-10-CM

## 2021-05-03 DIAGNOSIS — E1142 Type 2 diabetes mellitus with diabetic polyneuropathy: Secondary | ICD-10-CM | POA: Insufficient documentation

## 2021-05-03 DIAGNOSIS — E1121 Type 2 diabetes mellitus with diabetic nephropathy: Secondary | ICD-10-CM | POA: Insufficient documentation

## 2021-05-03 DIAGNOSIS — Z7901 Long term (current) use of anticoagulants: Secondary | ICD-10-CM | POA: Diagnosis not present

## 2021-05-03 DIAGNOSIS — Z6839 Body mass index (BMI) 39.0-39.9, adult: Secondary | ICD-10-CM | POA: Insufficient documentation

## 2021-05-03 DIAGNOSIS — Z794 Long term (current) use of insulin: Secondary | ICD-10-CM | POA: Insufficient documentation

## 2021-05-03 DIAGNOSIS — I130 Hypertensive heart and chronic kidney disease with heart failure and stage 1 through stage 4 chronic kidney disease, or unspecified chronic kidney disease: Secondary | ICD-10-CM | POA: Insufficient documentation

## 2021-05-03 DIAGNOSIS — I5042 Chronic combined systolic (congestive) and diastolic (congestive) heart failure: Secondary | ICD-10-CM

## 2021-05-03 DIAGNOSIS — K219 Gastro-esophageal reflux disease without esophagitis: Secondary | ICD-10-CM | POA: Insufficient documentation

## 2021-05-03 DIAGNOSIS — F17200 Nicotine dependence, unspecified, uncomplicated: Secondary | ICD-10-CM | POA: Insufficient documentation

## 2021-05-03 DIAGNOSIS — I252 Old myocardial infarction: Secondary | ICD-10-CM | POA: Insufficient documentation

## 2021-05-03 LAB — POCT INR: INR: 2.9 (ref 2.0–3.0)

## 2021-05-03 MED ORDER — SPIRONOLACTONE 25 MG PO TABS: 12.5000 mg | ORAL_TABLET | Freq: Every day | ORAL | 0 refills | Status: DC

## 2021-05-03 NOTE — Patient Instructions (Addendum)
It was nice meeting you today  We would like to keep your blood pressure less than 130/80  Continue Entresto 49-51 mg twice a day Continue carvedilol '25mg'$  twice a day Continue Jardiance '10mg'$  once a day Start spironolactone '25mg'$  (1/2 tablet once a day) Stop amlodipine at this time  Continue to watch your salt intake and remember to weigh yourself at home.  Please call if you gain more than 3# overnight or 5# in a week  We will update your lab work today and see you back in 3 weeks  Karren Cobble, PharmD, BCACP, [place], [place]. [address], [place], [place] [address] Phone: [phone]; Fax: [phone] 05/03/2021 10:01 AM

## 2021-05-03 NOTE — Progress Notes (Signed)
Patient ID: Desiree Harrison                 DOB: 1945-12-26                      MRN: [id number]     HPI: Desiree Harrison is a 76 y.o. female referred by Dr. Sallyanne Kuster to pharmacy clinic for HF medication management. PMH is significant for HTN, CHF, DM, and CKD. Most recent LVEF 50% in 2018.  Today she returns to pharmacy clinic for further medication titration from referral by Dr Sallyanne Kuster.. Symptomatically, she is feeling well.  Denies dizziness, lightheadedness, and fatigue. Denies chest pain or palpitations. Feels SOB only when walking up long hills with her dog but recovers quickly.. Able to complete all ADLs. Activity level normal. She has been checking her weight at home but only because she is trying to lose wight on Trulicity. Denies LEE, PND, or orthopnea. Appetite has been good. She adheres to a low-salt diet.  Has been taking her BP at home but forgot log.  Reports it is usally 114/70s or 112/70s.  At last visit with PharmD, Wilder Glade was discontinued and patient was trialed on Jardiance.  Is tolerating well.     Current CHF meds: Entresto 49/51 BID, carvedilol 100m BID, Jardiance 10 mg Previously tried: FIran(excessive urination), lisinopril (cough) BP goal: <130/80   Wt Readings from Last 3 Encounters:  02/16/21 238 lb (108 kg)  01/16/21 242 lb (109.8 kg)  10/17/20 180 lb (81.6 kg)   BP Readings from Last 3 Encounters:  02/16/21 116/70  01/16/21 128/72  10/17/20 140/80   Pulse Readings from Last 3 Encounters:  02/16/21 79  01/16/21 71  03/10/20 64    Renal function: CrCl cannot be calculated (Patient's most recent lab result is older than the maximum 21 days allowed.).  Past Medical History:  Diagnosis Date   Acute systolic heart failure (HCC)    CAD in native artery    Cardiomyopathy ([place]    Gout    Long term (current) use of anticoagulants    S/P internal cardiac defibrillator procedure    placed 10/28/15   STEMI (ST elevation myocardial infarction) ([place]    Type  2 diabetes mellitus, with long-term current use of insulin (HCC)    Ventricular aneurysm     Current Outpatient Medications on File Prior to Visit  Medication Sig Dispense Refill   amLODipine (NORVASC) 5 MG tablet TAKE 1 TABLET BY MOUTH EVERY DAY 90 tablet 3   aspirin EC 81 MG tablet Take 81 mg by mouth daily.     atorvastatin (LIPITOR) 80 MG tablet TAKE 1 TABLET BY MOUTH EVERY DAY 90 tablet 3   Blood Glucose Monitoring Suppl (ONETOUCH VERIO) w/Device KIT Use to check FSBS three times a day. Dx: E11.59, Z79.4 1 kit 0   carvedilol (COREG) 25 MG tablet Take 1 tablet (25 mg total) by mouth 2 (two) times daily with a meal. 180 tablet 1   gabapentin (NEURONTIN) 100 MG capsule Take 2 capsules (200 mg total) by mouth 2 (two) times daily. 120 capsule 5   glucose blood (ONETOUCH VERIO) test strip Use to check FSBS three times a day. Dx: E11.59, Z79.4 100 each 2   HUMALOG MIX 50/50 KWIKPEN (50-50) 100 UNIT/ML Kwikpen Inject 26 Units into the skin 2 (two) times daily. Dx: E11.59, Z79.4 15 mL 5   insulin lispro (HUMALOG KWIKPEN) 100 UNIT/ML KwikPen Inject 8 Units into the skin 3 (three) times  daily before meals. 15 mL 2   Insulin Pen Needle (BD PEN NEEDLE MICRO U/F) 32G X 6 MM MISC Use twice a day with Humalog Mix Kwikpen. Dx: E11.59, Z79.4 100 each 5   Insulin Syringe-Needle U-100 (INSULIN SYRINGE .3CC/31GX5/16") 31G X 5/16" 0.3 ML MISC Use TID to inject Humalog insulin. Dx: E11.59, Z79.4 100 each 5   Lancet Devices (ONE TOUCH DELICA LANCING DEV) MISC Use to check FSBS three times a day. Dx: E11.59, Z79.4 1 each 0   levothyroxine (SYNTHROID) 50 MCG tablet TAKE 1 TABLET BY MOUTH EVERY DAY 30 tablet 1   omeprazole (PRILOSEC) 20 MG capsule Take 30- 60 min before your first and last meals of the day     sacubitril-valsartan (ENTRESTO) 49-51 MG Take 1 tablet by mouth 2 (two) times daily. 60 tablet 1   traMADol (ULTRAM) 50 MG tablet Take 50 mg by mouth every 6 (six) hours as needed.     TRULICITY 1.5 GP/4.9IY  SOPN Inject into the skin.     warfarin (COUMADIN) 3 MG tablet TAKE 1 TO 1.5 TABLETS BY MOUTH DAILY AS DIRECTED BY COUMADIN CLINIC 120 tablet 0   No current facility-administered medications on file prior to visit.    Allergies  Allergen Reactions   Lisinopril Cough     Assessment/Plan:  1. CHF -  Patient BP 114/58 which is at goal of <130/80.  Patient says diastolic not typically that low at home.  Explained GDMT to patient again and she is willing to try spironolactone.  Will start at 12.79m once daily and check labs in 1 week.  Stop amlodipine.  Patient understands if BP drops too low to call.  Also instructed patient to watch salt intake and to continue weighing herself at home. Recommended calling if she gains more than 3# overnight or 5# in a week.  Patient voiced understanding.  Continue Entresto 49-51 BID Continue carvedilol 2107mBID Continue Jardiance 1080maily Stop amlodipine 5mg56mily Start spironolactone 12.5mg 50mly Check BMP today Recheck BMP in 1 week Recheck in clinic in 2-3 weeks  ChrisKarren CobblermD, BCACP, CDCESTenkiller CDollar Bayrc[id number]: [phone]: [phone]/2022 5:17 PM

## 2021-05-03 NOTE — Patient Instructions (Signed)
Description   Continue taking 1 tablet by mouth daily except take 1.5 tablets Mon, Wed, Fri. Recheck INR in 6 weeks.

## 2021-05-10 ENCOUNTER — Other Ambulatory Visit: Payer: Self-pay

## 2021-05-10 DIAGNOSIS — I5042 Chronic combined systolic (congestive) and diastolic (congestive) heart failure: Secondary | ICD-10-CM

## 2021-05-11 ENCOUNTER — Telehealth: Payer: Self-pay | Admitting: Pharmacist

## 2021-05-11 LAB — BASIC METABOLIC PANEL
BUN/Creatinine Ratio: 11 — ABNORMAL LOW (ref 12–28)
BUN: 14 mg/dL (ref 8–27)
CO2: 23 mmol/L (ref 20–29)
Calcium: 9.2 mg/dL (ref 8.7–10.3)
Chloride: 107 mmol/L — ABNORMAL HIGH (ref 96–106)
Creatinine, Ser: 1.26 mg/dL — ABNORMAL HIGH (ref 0.57–1.00)
Glucose: 79 mg/dL (ref 65–99)
Potassium: 4.5 mmol/L (ref 3.5–5.2)
Sodium: 143 mmol/L (ref 134–144)
eGFR: 45 mL/min/{1.73_m2} — ABNORMAL LOW (ref >59)

## 2021-05-11 NOTE — Telephone Encounter (Signed)
Spoke with patient. Slight decrease in renal function since starting spiro. K normal.  Patient reports diastolic BP has increased to 60s-70s and she feels better.  Has follow up in 2 weeks

## 2021-05-25 ENCOUNTER — Ambulatory Visit: Payer: Medicare Other | Admitting: Pharmacist

## 2021-05-25 ENCOUNTER — Ambulatory Visit: Payer: Medicare Other

## 2021-05-25 ENCOUNTER — Ambulatory Visit (INDEPENDENT_AMBULATORY_CARE_PROVIDER_SITE_OTHER): Payer: Medicare Other | Admitting: Pharmacist

## 2021-05-25 ENCOUNTER — Other Ambulatory Visit: Payer: Self-pay

## 2021-05-25 VITALS — BP 104/62 | HR 68 | Resp 14 | Ht 67.0 in | Wt 223.6 lb

## 2021-05-25 DIAGNOSIS — Z7901 Long term (current) use of anticoagulants: Secondary | ICD-10-CM | POA: Diagnosis not present

## 2021-05-25 DIAGNOSIS — I253 Aneurysm of heart: Secondary | ICD-10-CM | POA: Diagnosis not present

## 2021-05-25 DIAGNOSIS — I130 Hypertensive heart and chronic kidney disease with heart failure and stage 1 through stage 4 chronic kidney disease, or unspecified chronic kidney disease: Secondary | ICD-10-CM

## 2021-05-25 LAB — POCT INR: INR: 3 (ref 2.0–3.0)

## 2021-05-25 NOTE — Patient Instructions (Signed)
Description   Continue taking 1 tablet by mouth daily except take 1.5 tablets Mon, Wed, Fri. Recheck INR in 6 weeks.

## 2021-05-25 NOTE — Progress Notes (Signed)
Patient ID: Desiree Harrison                 DOB: June 02, 1946                      MRN: [id number]     HPI: Desiree Harrison is a 75 y.o. female referred by Dr. Sallyanne Kuster to pharmacy clinic for HF medication management. PMH is significant for HTN, CHF, DM, and CKD. Most recent LVEF 50% in 2018.  Today she returns to pharmacy clinic for further medication titration and to check BMET after starting spironolactone. Symptomatically, she is feeling well, dnies dizziness, lightheadedness, and fatigue.. chest pain or palpitations. Feels SOB when walking dog up hills. Able to complete all ADLs. . Denies LEE, PND, or orthopnea. Appetite has been normal. She does adheres to a low-salt diet.  Due for labs today  Current CHF meds:  Entresto 49-61m BID,  spironolactone 12.547mdaily,  Jardiance 1031maily Carvedilol 71m57mD Previously tried: lisinopril (cough), Farxiga BP goal: <130/80    Wt Readings from Last 3 Encounters:  05/03/21 228 lb (103.4 kg)  02/16/21 238 lb (108 kg)  01/16/21 242 lb (109.8 kg)   BP Readings from Last 3 Encounters:  05/03/21 (!) 114/58  02/16/21 116/70  01/16/21 128/72   Pulse Readings from Last 3 Encounters:  05/03/21 74  02/16/21 79  01/16/21 71    Renal function: CrCl cannot be calculated (Unknown ideal weight.).  Past Medical History:  Diagnosis Date   Acute systolic heart failure (HCC)    CAD in native artery    Cardiomyopathy (HCC)[place] Gout    Long term (current) use of anticoagulants    S/P internal cardiac defibrillator procedure    placed 10/28/15   STEMI (ST elevation myocardial infarction) (HCC)[place] Type 2 diabetes mellitus, with long-term current use of insulin (HCC)    Ventricular aneurysm     Current Outpatient Medications on File Prior to Visit  Medication Sig Dispense Refill   aspirin EC 81 MG tablet Take 81 mg by mouth daily.     atorvastatin (LIPITOR) 80 MG tablet TAKE 1 TABLET BY MOUTH EVERY DAY 90 tablet 3   Blood Glucose Monitoring Suppl  (ONETOUCH VERIO) w/Device KIT Use to check FSBS three times a day. Dx: E11.59, Z79.4 1 kit 0   carvedilol (COREG) 25 MG tablet Take 1 tablet (25 mg total) by mouth 2 (two) times daily with a meal. 180 tablet 1   Dulaglutide (TRULICITY) 0.750.990IP/3.8SNN once a week.     empagliflozin (JARDIANCE) 10 MG TABS tablet 1 tablet     gabapentin (NEURONTIN) 100 MG capsule Take 2 capsules (200 mg total) by mouth 2 (two) times daily. 120 capsule 5   glucose blood (ONETOUCH VERIO) test strip Use to check FSBS three times a day. Dx: E11.59, Z79.4 100 each 2   HUMALOG MIX 50/50 KWIKPEN (50-50) 100 UNIT/ML Kwikpen Inject 26 Units into the skin 2 (two) times daily. Dx: E11.59, Z79.4 15 mL 5   Insulin Pen Needle (BD PEN NEEDLE MICRO U/F) 32G X 6 MM MISC Use twice a day with Humalog Mix Kwikpen. Dx: E11.59, Z79.4 100 each 5   Insulin Syringe-Needle U-100 (INSULIN SYRINGE .3CC/31GX5/16") 31G X 5/16" 0.3 ML MISC Use TID to inject Humalog insulin. Dx: E11.59, Z79.4 100 each 5   Lancet Devices (ONE TOUCH DELICA LANCING DEV) MISC Use to check FSBS three times a day. Dx: E11.59, Z79.4 1 each  0   levothyroxine (SYNTHROID) 50 MCG tablet TAKE 1 TABLET BY MOUTH EVERY DAY 30 tablet 1   omeprazole (PRILOSEC) 20 MG capsule Take 30- 60 min before your first and last meals of the day     sacubitril-valsartan (ENTRESTO) 49-51 MG Take 1 tablet by mouth 2 (two) times daily. 60 tablet 1   spironolactone (ALDACTONE) 25 MG tablet Take 0.5 tablets (12.5 mg total) by mouth daily. 15 tablet 0   warfarin (COUMADIN) 3 MG tablet TAKE 1 TO 1.5 TABLETS BY MOUTH DAILY AS DIRECTED BY COUMADIN CLINIC 120 tablet 0   No current facility-administered medications on file prior to visit.    Allergies  Allergen Reactions   Lisinopril Cough   Metformin     Other reaction(s): nausea   Sitagliptin     Other reaction(s): nausea     Assessment/Plan:  1. CHF -  Patient BP in room 104/62 which is at goal of <130/80.  Symptomatically feeling well.   Will update BMP today since starting spironolactone. Consider increasing to 34m daily (or increasing Entresto) but may not be able to due to BP.  Will check INR at visit as well.  Continue carvedilol 238mBID Continue empagliflozin 1069maily Continue spironolactone 12.5mg50mily Continue Entresto 49-51mg68m Check BMP  ChrisKarren CobblermD, BCACP, CDCES, CPP [place]aple[place] [id number]: [phone]: [phone]/2022 1:35 PM

## 2021-05-25 NOTE — Patient Instructions (Addendum)
It was good seeing you today!  We would like to keep your blood pressure less than 130/80  Please continue your:  Carvedilol '25mg'$  twice a day Jardiance '10mg'$  once a day Entresto 49-'51mg'$  twice a day Spironolactone '25mg'$  (1/2 tablet) once a day  We will check your lab work today and give you a call with the results  Karren Cobble, PharmD, BCACP, [place], [place] [id number] [place], [place] [address] Phone: [phone]; Fax: [phone] 05/25/2021 11:47 AM

## 2021-05-26 ENCOUNTER — Encounter: Payer: Self-pay | Admitting: *Deleted

## 2021-05-26 LAB — BASIC METABOLIC PANEL
BUN/Creatinine Ratio: 12 (ref 12–28)
BUN: 14 mg/dL (ref 8–27)
CO2: 23 mmol/L (ref 20–29)
Calcium: 9.1 mg/dL (ref 8.7–10.3)
Chloride: 109 mmol/L — ABNORMAL HIGH (ref 96–106)
Creatinine, Ser: 1.19 mg/dL — ABNORMAL HIGH (ref 0.57–1.00)
Glucose: 75 mg/dL (ref 65–99)
Potassium: 4.7 mmol/L (ref 3.5–5.2)
Sodium: 144 mmol/L (ref 134–144)
eGFR: 48 mL/min/{1.73_m2} — ABNORMAL LOW (ref >59)

## 2021-06-01 ENCOUNTER — Telehealth: Payer: Self-pay | Admitting: Pharmacist

## 2021-06-01 DIAGNOSIS — I5042 Chronic combined systolic (congestive) and diastolic (congestive) heart failure: Secondary | ICD-10-CM

## 2021-06-01 MED ORDER — SPIRONOLACTONE 25 MG PO TABS: 12.5000 mg | ORAL_TABLET | Freq: Every day | ORAL | 1 refills | Status: DC

## 2021-06-01 NOTE — Telephone Encounter (Signed)
Patient called to discuss lab work.  Explained lab work was stable and renal function had slightly improved.  No med changes needed.  Patient requests spironolactone refill.

## 2021-06-21 ENCOUNTER — Encounter: Payer: Self-pay | Admitting: Internal Medicine

## 2021-06-23 ENCOUNTER — Other Ambulatory Visit: Payer: Self-pay | Admitting: Cardiovascular Disease

## 2021-07-06 ENCOUNTER — Ambulatory Visit: Payer: Medicare Other

## 2021-07-06 ENCOUNTER — Other Ambulatory Visit: Payer: Self-pay

## 2021-07-06 DIAGNOSIS — Z7901 Long term (current) use of anticoagulants: Secondary | ICD-10-CM | POA: Diagnosis not present

## 2021-07-06 DIAGNOSIS — I253 Aneurysm of heart: Secondary | ICD-10-CM

## 2021-07-06 LAB — POCT INR: INR: 2.6 (ref 2.0–3.0)

## 2021-07-06 NOTE — Patient Instructions (Signed)
Continue taking 1 tablet by mouth daily except take 1.5 tablets Mon, Wed, Fri. Recheck INR in 6 weeks.

## 2021-07-17 NOTE — Progress Notes (Signed)
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  Denies any melena or hematochezia.  She does have constipation issues occasionally.  On average she has 1 bowel movement every other day.  Denies unintentional weight loss, nausea, vomiting, dysphagia, abdominal pain.  She does have issues with acid reflux for which she takes Prilosec daily, which keeps her GERD under good control.  Last colonoscopy was 13 years ago when she was told that she had diverticulosis. Denies fam hx of colon cancer. She has two sisters who have had colon polyps, unclear what type exactly.  She does take warfarin regularly.  She had an appendectomy in 1984, cholecystectomy in 2014, and hysterectomy in 2000.  Past Medical History:  Diagnosis Date   Acute systolic heart failure (HCC)    CAD in native artery    Cardiomyopathy ([place])    Gout    Hypertension    Long term (current) use of anticoagulants    S/P internal cardiac defibrillator procedure    placed 10/28/15   STEMI (ST elevation myocardial infarction) ([place])    Type 2 diabetes mellitus, with long-term current use of insulin (HCC)    Ventricular aneurysm      Past Surgical History:  Procedure Laterality Date   ABDOMINAL HYSTERECTOMY     APPENDECTOMY     BREAST BIOPSY Left    CARDIAC CATHETERIZATION     CARDIAC DEFIBRILLATOR PLACEMENT  2017   CHOLECYSTECTOMY     VIDEO BRONCHOSCOPY WITH ENDOBRONCHIAL ULTRASOUND N/A 01/20/2019   Procedure: VIDEO BRONCHOSCOPY WITH BIOPSY;  Surgeon: Marshell Garfinkel, MD;  Location: [facility];  Service: Pulmonary;  Laterality: N/A;   Family History  Problem Relation Age of Onset   Breast cancer Mother    Coronary artery disease Mother    Diabetes Mother    Stroke Father    Social History    Tobacco Use   Smoking status: Former    Packs/day: 0.25    Types: Cigarettes    Quit date: 12/30/1963    Years since quitting: 57.5   Smokeless tobacco: Never   Tobacco comments:    pt smoked 5-6/cigs daily 12/30/2018  Substance Use Topics   Alcohol use: Not Currently   Drug use: Never   Current Outpatient Medications  Medication Sig Dispense Refill   aspirin EC 81 MG tablet Take 81 mg by mouth daily.     atorvastatin (LIPITOR) 80 MG tablet TAKE 1 TABLET BY MOUTH EVERY DAY 90 tablet 3   Blood Glucose Monitoring Suppl (ONETOUCH VERIO) w/Device KIT Use to check FSBS three times a day. Dx: E11.59, Z79.4 1 kit 0   carvedilol (COREG) 25 MG tablet Take 1 tablet (25 mg total) by mouth 2 (two) times daily with a meal. 180 tablet 1   empagliflozin (JARDIANCE) 10 MG TABS tablet 1 tablet     gabapentin (NEURONTIN) 100 MG capsule Take 2 capsules (200 mg total) by mouth 2 (two) times daily. 120 capsule 5   glucose blood (ONETOUCH VERIO) test strip Use to check FSBS three times a day. Dx: E11.59, Z79.4 100 each 2   HUMALOG MIX 50/50 KWIKPEN (50-50) 100 UNIT/ML Kwikpen Inject 26 Units into the skin 2 (two) times daily. Dx: E11.59, Z79.4 15 mL 5   Insulin  Pen Needle (BD PEN NEEDLE MICRO U/F) 32G X 6 MM MISC Use twice a day with Humalog Mix Kwikpen. Dx: E11.59, Z79.4 100 each 5   Lancet Devices (ONE TOUCH DELICA LANCING DEV) MISC Use to check FSBS three times a day. Dx: E11.59, Z79.4 1 each 0   levothyroxine (SYNTHROID) 50 MCG tablet TAKE 1 TABLET BY MOUTH EVERY DAY 30 tablet 1   omeprazole (PRILOSEC) 20 MG capsule Take 30- 60 min before your first and last meals of the day     sacubitril-valsartan (ENTRESTO) 49-51 MG Take 1 tablet by mouth 2 (two) times daily. 60 tablet 1   Sodium Sulfate-Mag Sulfate-KCl (SUTAB) [phone] MG TABS Take 1 kit by mouth once for 1 dose. 24 tablet 0   spironolactone (ALDACTONE) 25 MG tablet Take 0.5 tablets (12.5 mg total) by mouth daily. 45 tablet 1   TRULICITY 1.5  UJ/8.1XB SOPN Inject 1.5 mg into the skin once a week.     warfarin (COUMADIN) 3 MG tablet TAKE 1 TO 1 &1/2 TABLET BY MOUTH DAILY AS DIRECTED BY COUMADIN CLINIC 120 tablet 0   No current facility-administered medications for this visit.   Allergies  Allergen Reactions   Lisinopril Cough   Metformin     Other reaction(s): nausea   Sitagliptin     Other reaction(s): nausea     Review of Systems: All systems reviewed and negative except where noted in HPI.   Physical Exam: BP 126/64   Pulse 70   Ht _0  (1.702 m)   Wt 218 lb (98.9 kg)   BMI 34.14 kg/m  Constitutional: Pleasant,well-developed, female in no acute distress. HEENT: Normocephalic and atraumatic. Conjunctivae are normal. No scleral icterus. Cardiovascular: Normal rate, regular rhythm.  Pulmonary/chest: Effort normal and breath sounds normal. No wheezing, rales or rhonchi. Abdominal: Soft, nondistended, nontender. Bowel sounds active throughout. There are no masses palpable. No hepatomegaly. Extremities: No edema Neurological: Alert and oriented to person place and time. Skin: Skin is warm and dry. No rashes noted. Psychiatric: Normal mood and affect. Behavior is normal.  Labs 06/2020: Unremarkable CBC  Labs 05/2021: BMP with mildly elevated Cr of 1.19. CT A/P w/o contrast 06/10/16: ABDOMEN:  .  Liver: Within normal limits.  .  Gallbladder/biliary: Cholecystectomy. Small calcification measuring 1 to 2 mm adjacent to the common hepatic duct. I suspect that this is extrinsic.  Marland Kitchen  Spleen: Within normal limits.  .  Pancreas: Within normal limits.  .  Adrenals: Within normal limits.  .  Kidneys: Bilateral 2 to 4 mm nonobstructing intrarenal calculi. No ureteral calculi.  Marland Kitchen  Peritoneum: Within normal limits.  .  Mesentery: Within normal limits.  .  Extraperitoneum: Retroperitoneal phleboliths on the left simulating ureteral calculi.  .  GI tract: Small hiatal hernia. Normal small intestine. Appendix not visualized. No  appendiceal pathology. Mild retained fecal material. Colonic diverticulosis no evidence of diverticulitis.  .  Vascular: Within normal limits.    TTE 02/20/17: SUMMARY  There is mild concentric left ventricular hypertrophy.   Left ventricular systolic function is normal.  LV ejection fraction = 50-55%.   Left ventricular filling pattern is impaired relaxation.  The left atrial size is normal.  There is no aortic stenosis.  There is trace mitral regurgitation.  There is mild tricuspid regurgitation.  There is no pericardial effusion.  Improvement in EF MUGA 18.5 % 09/15/15  Clinical correlation is recommended.   ASSESSMENT AND PLAN: Positive FOBT Constipation GERD Patient presents for positive FOBT.  Has  not had her CBC checked for over a year or so we will make sure her blood counts are appropriate.  We will plan for colonoscopy with a 2-day preparation due to her baseline issues with constipation.  Her GERD is well controlled on Prilosec daily. - Check CBC - Colonoscopy with 2 day prep. Will need warfarin held beforehand so will touch base with her cardiologist. Patient requested instructions about how to manage her diabetes regimen beforehand - Continue Prilosec  [name], MD

## 2021-07-18 ENCOUNTER — Other Ambulatory Visit (INDEPENDENT_AMBULATORY_CARE_PROVIDER_SITE_OTHER): Payer: Medicare Other

## 2021-07-18 ENCOUNTER — Encounter: Payer: Self-pay | Admitting: Internal Medicine

## 2021-07-18 ENCOUNTER — Telehealth: Payer: Self-pay

## 2021-07-18 ENCOUNTER — Ambulatory Visit (INDEPENDENT_AMBULATORY_CARE_PROVIDER_SITE_OTHER): Payer: Medicare Other | Admitting: Internal Medicine

## 2021-07-18 VITALS — BP 126/64 | HR 70 | Ht 67.0 in | Wt 218.0 lb

## 2021-07-18 DIAGNOSIS — K219 Gastro-esophageal reflux disease without esophagitis: Secondary | ICD-10-CM

## 2021-07-18 DIAGNOSIS — R195 Other fecal abnormalities: Secondary | ICD-10-CM

## 2021-07-18 DIAGNOSIS — K5900 Constipation, unspecified: Secondary | ICD-10-CM | POA: Diagnosis not present

## 2021-07-18 LAB — CBC WITH DIFFERENTIAL/PLATELET
Basophils Absolute: 0 10*3/uL (ref 0.0–0.1)
Basophils Relative: 0.8 % (ref 0.0–3.0)
Eosinophils Absolute: 0.1 10*3/uL (ref 0.0–0.7)
Eosinophils Relative: 1.7 % (ref 0.0–5.0)
HCT: 47.4 % — ABNORMAL HIGH (ref 36.0–46.0)
Hemoglobin: 14.8 g/dL (ref 12.0–15.0)
Lymphocytes Relative: 38.8 % (ref 12.0–46.0)
Lymphs Abs: 2.4 10*3/uL (ref 0.7–4.0)
MCHC: 31.3 g/dL (ref 30.0–36.0)
MCV: 83.7 fl (ref 78.0–100.0)
Monocytes Absolute: 0.6 10*3/uL (ref 0.1–1.0)
Monocytes Relative: 9.8 % (ref 3.0–12.0)
Neutro Abs: 3 10*3/uL (ref 1.4–7.7)
Neutrophils Relative %: 48.9 % (ref 43.0–77.0)
Platelets: 185 10*3/uL (ref 150.0–400.0)
RBC: 5.65 Mil/uL — ABNORMAL HIGH (ref 3.87–5.11)
RDW: 16.2 % — ABNORMAL HIGH (ref 11.5–15.5)
WBC: 6.1 10*3/uL (ref 4.0–10.5)

## 2021-07-18 MED ORDER — SUTAB 1479-225-188 MG PO TABS: 1.0000 | ORAL_TABLET | Freq: Once | ORAL | 0 refills | Status: AC

## 2021-07-18 NOTE — Telephone Encounter (Signed)
Patient with diagnosis of ventricular aneurysm on warfarin for anticoagulation (INR goal 2-3).    Procedure: colonoscopy Date of procedure: 08/25/21  CrCl 50.42 mL/min using adjusted body weight due to obesity Platelet count 185K  Patient will need to hold warfarin for 5 days prior to procedure. Do not think patient needs Lovenox bridging. However, given unusual indication will forward to MD to confirm.

## 2021-07-18 NOTE — Patient Instructions (Addendum)
If you are age 74 or older, your body mass index should be between 23-30. Your Body mass index is 34.14 kg/m. If this is out of the aforementioned range listed, please consider follow up with your Primary Care Provider.  If you are age 41 or younger, your body mass index should be between 19-25. Your Body mass index is 34.14 kg/m. If this is out of the aformentioned range listed, please consider follow up with your Primary Care Provider.   ________________________________________________________  The [facility] providers would like to encourage you to use [facility] to communicate with providers for non-urgent requests or questions.  Due to long hold times on the telephone, sending your provider a message by [facility] may be a faster and more efficient way to get a response.  Please allow 48 business hours for a response.  Please remember that this is for non-urgent requests.  _______________________________________________________  Desiree Harrison have been scheduled for a colonoscopy. Please follow written instructions given to you at your visit today.  Please pick up your prep supplies at the pharmacy within the next 1-3 days. If you use inhalers (even only as needed), please bring them with you on the day of your procedure.  Please consult with your PCP regarding adjusting your diabetic medications during prepping for your Procedure.  You will be contacted by our office prior to your procedure for directions on holding your Coumadin.  If you do not hear from our office 1 week prior to your scheduled procedure, please call [phone] to discuss.    Please go to the lab in the basement of our building to have lab work done as you leave today. Hit "B" for basement when you get on the elevator.  When the doors open the lab is on your left.  We will call you with the results. Thank you.  Thank you for entrusting me with your care and for choosing [facility], Dr. [name]

## 2021-07-18 NOTE — Telephone Encounter (Signed)
Clinical pharmacist to review Coumadin ?

## 2021-07-18 NOTE — Telephone Encounter (Signed)
[facility] Pre-operative Risk Assessment     Request for surgical clearance:     Endoscopy Procedure  What type of surgery is being performed?     COLONOSCOPY  When is this surgery scheduled?     08-25-21  What type of clearance is required ?   Pharmacy  Are there any medications that need to be held prior to surgery and how long? COUMADIN 5 DAYS  Practice name and name of physician performing surgery?  [facility]  What is your office phone and fax number?      Phone- [phone]  Fax- [phone]  attn: Tia Alert  Anesthesia type (None, local, MAC, general) ?    MAC  Thank you

## 2021-07-19 NOTE — Telephone Encounter (Signed)
Ok for pt to hold warfarin for 5 days prior to procedure without bridging.

## 2021-07-19 NOTE — Telephone Encounter (Signed)
Called and spoke to patient. She understands to hold Coumadin starting on 12-11.

## 2021-07-19 NOTE — Telephone Encounter (Signed)
    Patient Name: Desiree Harrison  DOB: 1946/07/07 MRN: [id number]  Primary Cardiologist: Sanda Klein, MD  Chart reviewed as part of pre-operative protocol coverage. Patient is scheduled for a colonoscopy on 08/25/2021 and we were asked to give our recommendations for holding Coumadin. Per Pharmacy and Dr. Sallyanne Kuster, patient can hold Coumadin for 5 days prior to procedure.  He will not need to be bridged with Lovenox. Please restart Coumadin as soon as safely possible after the colonoscopy.   I will route this recommendation to the requesting party via Epic fax function and remove from pre-op pool.  Please call with questions.  Darreld Mclean, PA-C 07/19/2021, 12:56 PM

## 2021-08-17 ENCOUNTER — Ambulatory Visit: Payer: Medicare Other

## 2021-08-17 ENCOUNTER — Other Ambulatory Visit: Payer: Self-pay

## 2021-08-17 DIAGNOSIS — Z7901 Long term (current) use of anticoagulants: Secondary | ICD-10-CM

## 2021-08-17 DIAGNOSIS — I253 Aneurysm of heart: Secondary | ICD-10-CM

## 2021-08-17 LAB — POCT INR: INR: 3 (ref 2.0–3.0)

## 2021-08-17 NOTE — Patient Instructions (Signed)
Continue taking 1 tablet by mouth daily except take 1.5 tablets Mon, Wed, Fri. Recheck INR in 2 weeks. Hold Coumadin 12/11 (Sunday) until 12/15 (Thursday); Colonoscopy 12/16

## 2021-08-25 ENCOUNTER — Other Ambulatory Visit: Payer: Self-pay

## 2021-08-25 ENCOUNTER — Encounter: Payer: Self-pay | Admitting: Internal Medicine

## 2021-08-25 ENCOUNTER — Ambulatory Visit (AMBULATORY_SURGERY_CENTER): Payer: Medicare Other | Admitting: Internal Medicine

## 2021-08-25 VITALS — BP 129/64 | HR 75 | Temp 97.3°F | Resp 22 | Ht 67.0 in | Wt 218.0 lb

## 2021-08-25 DIAGNOSIS — D123 Benign neoplasm of transverse colon: Secondary | ICD-10-CM

## 2021-08-25 DIAGNOSIS — D122 Benign neoplasm of ascending colon: Secondary | ICD-10-CM

## 2021-08-25 DIAGNOSIS — K5730 Diverticulosis of large intestine without perforation or abscess without bleeding: Secondary | ICD-10-CM

## 2021-08-25 DIAGNOSIS — K648 Other hemorrhoids: Secondary | ICD-10-CM

## 2021-08-25 DIAGNOSIS — D125 Benign neoplasm of sigmoid colon: Secondary | ICD-10-CM

## 2021-08-25 DIAGNOSIS — R195 Other fecal abnormalities: Secondary | ICD-10-CM

## 2021-08-25 MED ORDER — SODIUM CHLORIDE 0.9 % IV SOLN: 500.0000 mL | Freq: Once | INTRAVENOUS | Status: DC

## 2021-08-25 NOTE — Progress Notes (Signed)
Called to room to assist during endoscopic procedure.  Patient ID and intended procedure confirmed with present staff. Received instructions for my participation in the procedure from the performing physician.  

## 2021-08-25 NOTE — Progress Notes (Signed)
To PACU, VSS. Report to [email or web address] 

## 2021-08-25 NOTE — Progress Notes (Signed)
VS completed by CW.  Medical and surgical history reviewed and updated.  

## 2021-08-25 NOTE — Patient Instructions (Addendum)
Handouts given for polyps and diverticulosis.  RESUME COUMADIN TONIGHT AT USUAL DOSE!!  YOU HAD AN ENDOSCOPIC PROCEDURE TODAY AT THE [facility]:   Refer to the procedure report that was given to you for any specific questions about what was found during the examination.  If the procedure report does not answer your questions, please call your gastroenterologist to clarify.  If you requested that your care partner not be given the details of your procedure findings, then the procedure report has been included in a sealed envelope for you to review at your convenience later.  YOU SHOULD EXPECT: Some feelings of bloating in the abdomen. Passage of more gas than usual.  Walking can help get rid of the air that was put into your GI tract during the procedure and reduce the bloating. If you had a lower endoscopy (such as a colonoscopy or flexible sigmoidoscopy) you may notice spotting of blood in your stool or on the toilet paper. If you underwent a bowel prep for your procedure, you may not have a normal bowel movement for a few days.  Please Note:  You might notice some irritation and congestion in your nose or some drainage.  This is from the oxygen used during your procedure.  There is no need for concern and it should clear up in a day or so.  SYMPTOMS TO REPORT IMMEDIATELY:  Following lower endoscopy (colonoscopy or flexible sigmoidoscopy):  Excessive amounts of blood in the stool  Significant tenderness or worsening of abdominal pains  Swelling of the abdomen that is new, acute  Fever of 100F or higher  For urgent or emergent issues, a gastroenterologist can be reached at any hour by calling [phone]. Do not use MyChart messaging for urgent concerns.    DIET:  We do recommend a small meal at first, but then you may proceed to your regular diet.  Drink plenty of fluids but you should avoid alcoholic beverages for 24 hours.  ACTIVITY:  You should plan to take it easy for  the rest of today and you should NOT DRIVE or use heavy machinery until tomorrow (because of the sedation medicines used during the test).    FOLLOW UP: Our staff will call the number listed on your records 48-72 hours following your procedure to check on you and address any questions or concerns that you may have regarding the information given to you following your procedure. If we do not reach you, we will leave a message.  We will attempt to reach you two times.  During this call, we will ask if you have developed any symptoms of COVID 19. If you develop any symptoms (ie: fever, flu-like symptoms, shortness of breath, cough etc.) before then, please call [phone].  If you test positive for Covid 19 in the 2 weeks post procedure, please call and report this information to [place].    If any biopsies were taken you will be contacted by phone or by letter within the next 1-3 weeks.  Please call us at [phone] if you have not heard about the biopsies in 3 weeks.    SIGNATURES/CONFIDENTIALITY: You and/or your care partner have signed paperwork which will be entered into your electronic medical record.  These signatures attest to the fact that that the information above on your After Visit Summary has been reviewed and is understood.  Full responsibility of the confidentiality of this discharge information lies with you and/or your care-partner.

## 2021-08-25 NOTE — Op Note (Signed)
[facility] Patient Name: Desiree Harrison Procedure Date: 08/25/2021 2:50 PM MRN: [id number] Endoscopist: Sonny Masters "[name] ,  Age: 75 Referring MD:  Date of Birth: [birth year] Gender: Female Account #: [id number] Procedure:                Colonoscopy Indications:              Positive fecal immunochemical test Medicines:                Monitored Anesthesia Care Procedure:                Pre-Anesthesia Assessment:                           - Prior to the procedure, a History and Physical                            was performed, and patient medications and                            allergies were reviewed. The patient's tolerance of                            previous anesthesia was also reviewed. The risks                            and benefits of the procedure and the sedation                            options and risks were discussed with the patient.                            All questions were answered, and informed consent                            was obtained. Prior Anticoagulants: The patient has                            taken no previous anticoagulant or antiplatelet                            agents. ASA Grade Assessment: II - A patient with                            mild systemic disease. After reviewing the risks                            and benefits, the patient was deemed in                            satisfactory condition to undergo the procedure.                           After obtaining informed consent, the colonoscope  was passed under direct vision. Throughout the                            procedure, the patient's blood pressure, pulse, and                            oxygen saturations were monitored continuously. The                            Olympus PCF-H190DL [id number]) Colonoscope was                            introduced through the anus and advanced to the the                            cecum, identified by  appendiceal orifice and                            ileocecal valve. The colonoscopy was performed                            without difficulty. The patient tolerated the                            procedure well. The quality of the bowel                            preparation was adequate. The terminal ileum,                            ileocecal valve, appendiceal orifice, and rectum                            were photographed. Scope In: 3:03:22 PM Scope Out: 3:34:48 PM Scope Withdrawal Time: 0 hours 20 minutes 32 seconds  Total Procedure Duration: 0 hours 31 minutes 26 seconds  Findings:                 Six sessile polyps were found in the sigmoid colon,                            transverse colon and ascending colon. The polyps                            were 3 to 8 mm in size. These polyps were removed                            with a cold snare. Resection and retrieval were                            complete.                           Multiple small and large-mouthed diverticula were  found in the sigmoid colon, descending colon and                            transverse colon.                           Non-bleeding internal hemorrhoids were found during                            retroflexion. Complications:            No immediate complications. Estimated Blood Loss:     Estimated blood loss was minimal. Impression:               - Six 3 to 8 mm polyps in the sigmoid colon, in the                            transverse colon and in the ascending colon,                            removed with a cold snare. Resected and retrieved.                           - Diverticulosis in the sigmoid colon, in the                            descending colon and in the transverse colon.                           - Non-bleeding internal hemorrhoids. Recommendation:           - Discharge patient to home (with escort).                           - Await pathology  results.                           - The findings and recommendations were discussed                            with the patient. Sonny Masters "[name],  08/25/2021 3:39:31 PM

## 2021-08-25 NOTE — Progress Notes (Signed)
GASTROENTEROLOGY PROCEDURE H&P NOTE   Primary Care Physician: Michael Boston, MD    Reason for Procedure:   Heme positive stool  Plan:    Colonoscopy  Patient is appropriate for endoscopic procedure(s) in the ambulatory ([place]) setting.  The nature of the procedure, as well as the risks, benefits, and alternatives were carefully and thoroughly reviewed with the patient. Ample time for discussion and questions allowed. The patient understood, was satisfied, and agreed to proceed.     HPI: Desiree Harrison is a 75 y.o. female who presents for colonoscopy for evaluation of heme positive stool .  Patient was most recently seen in the Gastroenterology Clinic on 07/18/21.  No interval change in medical history since that appointment. Please refer to that note for full details regarding GI history and clinical presentation.   Past Medical History:  Diagnosis Date   Acute systolic heart failure (HCC)    CAD in native artery    Cardiomyopathy ([place])    Gout    Hypertension    Long term (current) use of anticoagulants    S/P internal cardiac defibrillator procedure    placed 10/28/15   STEMI (ST elevation myocardial infarction) ([place])    Thyroid disease    Type 2 diabetes mellitus, with long-term current use of insulin (HCC)    Ventricular aneurysm     Past Surgical History:  Procedure Laterality Date   ABDOMINAL HYSTERECTOMY     APPENDECTOMY     BREAST BIOPSY Left    CARDIAC CATHETERIZATION     CARDIAC DEFIBRILLATOR PLACEMENT  2017   CHOLECYSTECTOMY     COLONOSCOPY     VIDEO BRONCHOSCOPY WITH ENDOBRONCHIAL ULTRASOUND N/A 01/20/2019   Procedure: VIDEO BRONCHOSCOPY WITH BIOPSY;  Surgeon: Marshell Garfinkel, MD;  Location: [facility];  Service: Pulmonary;  Laterality: N/A;    Prior to Admission medications   Medication Sig Start Date End Date Taking? Authorizing Provider  aspirin EC 81 MG tablet Take 81 mg by mouth daily.   Yes [provider]  atorvastatin (LIPITOR) 80 MG tablet  TAKE 1 TABLET BY MOUTH EVERY DAY 02/11/20  Yes Meng, [place], PA  Blood Glucose Monitoring Suppl (ONETOUCH VERIO) w/Device KIT Use to check FSBS three times a day. Dx: E11.59, Z79.4 12/31/19  Yes Nicolette Bang, MD  carvedilol (COREG) 25 MG tablet Take 1 tablet (25 mg total) by mouth 2 (two) times daily with a meal. 04/17/19  Yes Croitoru, Mihai, MD  empagliflozin (JARDIANCE) 10 MG TABS tablet 1 tablet   Yes [provider]  gabapentin (NEURONTIN) 100 MG capsule Take 2 capsules (200 mg total) by mouth 2 (two) times daily. 07/01/20  Yes Nicolette Bang, MD  glucose blood (ONETOUCH VERIO) test strip Use to check FSBS three times a day. Dx: E11.59, Z79.4 12/31/19  Yes Nicolette Bang, MD  HUMALOG MIX 50/50 KWIKPEN (50-50) 100 UNIT/ML Kwikpen Inject 26 Units into the skin 2 (two) times daily. Dx: E11.59, Z79.4 08/25/20  Yes Nicolette Bang, MD  Insulin Pen Needle (BD PEN NEEDLE MICRO U/F) 32G X 6 MM MISC Use twice a day with Humalog Mix Kwikpen. Dx: E11.59, Z79.4 08/25/20  Yes Nicolette Bang, MD  Lancet Devices (ONE TOUCH DELICA LANCING DEV) MISC Use to check FSBS three times a day. Dx: E11.59, Z79.4 12/31/19  Yes Nicolette Bang, MD  levothyroxine (SYNTHROID) 50 MCG tablet TAKE 1 TABLET BY MOUTH EVERY DAY 11/03/20  Yes Nicolette Bang, MD  omeprazole (PRILOSEC) 20 MG capsule  Take 30- 60 min before your first and last meals of the day 12/30/18  Yes Tanda Rockers, MD  sacubitril-valsartan (ENTRESTO) 49-51 MG Take 1 tablet by mouth 2 (two) times daily. 01/16/21  Yes Croitoru, Mihai, MD  spironolactone (ALDACTONE) 25 MG tablet Take 0.5 tablets (12.5 mg total) by mouth daily. 06/01/21  Yes Croitoru, Mihai, MD  TRULICITY 1.5 DQ/2.2WL SOPN Inject 1.5 mg into the skin once a week. 05/04/21  Yes [provider]  warfarin (COUMADIN) 3 MG tablet TAKE 1 TO 1 &1/2 TABLET BY MOUTH DAILY AS DIRECTED BY COUMADIN CLINIC 06/23/21   Croitoru, Mihai,  MD    Current Outpatient Medications  Medication Sig Dispense Refill   aspirin EC 81 MG tablet Take 81 mg by mouth daily.     atorvastatin (LIPITOR) 80 MG tablet TAKE 1 TABLET BY MOUTH EVERY DAY 90 tablet 3   Blood Glucose Monitoring Suppl (ONETOUCH VERIO) w/Device KIT Use to check FSBS three times a day. Dx: E11.59, Z79.4 1 kit 0   carvedilol (COREG) 25 MG tablet Take 1 tablet (25 mg total) by mouth 2 (two) times daily with a meal. 180 tablet 1   empagliflozin (JARDIANCE) 10 MG TABS tablet 1 tablet     gabapentin (NEURONTIN) 100 MG capsule Take 2 capsules (200 mg total) by mouth 2 (two) times daily. 120 capsule 5   glucose blood (ONETOUCH VERIO) test strip Use to check FSBS three times a day. Dx: E11.59, Z79.4 100 each 2   HUMALOG MIX 50/50 KWIKPEN (50-50) 100 UNIT/ML Kwikpen Inject 26 Units into the skin 2 (two) times daily. Dx: E11.59, Z79.4 15 mL 5   Insulin Pen Needle (BD PEN NEEDLE MICRO U/F) 32G X 6 MM MISC Use twice a day with Humalog Mix Kwikpen. Dx: E11.59, Z79.4 100 each 5   Lancet Devices (ONE TOUCH DELICA LANCING DEV) MISC Use to check FSBS three times a day. Dx: E11.59, Z79.4 1 each 0   levothyroxine (SYNTHROID) 50 MCG tablet TAKE 1 TABLET BY MOUTH EVERY DAY 30 tablet 1   omeprazole (PRILOSEC) 20 MG capsule Take 30- 60 min before your first and last meals of the day     sacubitril-valsartan (ENTRESTO) 49-51 MG Take 1 tablet by mouth 2 (two) times daily. 60 tablet 1   spironolactone (ALDACTONE) 25 MG tablet Take 0.5 tablets (12.5 mg total) by mouth daily. 45 tablet 1   TRULICITY 1.5 NL/8.9QJ SOPN Inject 1.5 mg into the skin once a week.     warfarin (COUMADIN) 3 MG tablet TAKE 1 TO 1 &1/2 TABLET BY MOUTH DAILY AS DIRECTED BY COUMADIN CLINIC 120 tablet 0   Current Facility-Administered Medications  Medication Dose Route Frequency Provider Last Rate Last Admin   0.9 %  sodium chloride infusion  500 mL Intravenous Once Sharyn Creamer, MD        Allergies as of 08/25/2021 - Review  Complete 08/25/2021  Allergen Reaction Noted   Lisinopril Cough 08/08/2015   Metformin  04/05/2021   Sitagliptin  04/05/2021    Family History  Problem Relation Age of Onset   Breast cancer Mother    Coronary artery disease Mother    Diabetes Mother    Stroke Father    Colon cancer Neg Hx    Rectal cancer Neg Hx    Stomach cancer Neg Hx     Social History   Socioeconomic History   Marital status: Widowed    Spouse name: Not on file   Number of  children: Not on file   Years of education: Not on file   Highest education level: Not on file  Occupational History   Not on file  Tobacco Use   Smoking status: Former    Packs/day: 0.25    Types: Cigarettes    Quit date: 12/30/1963    Years since quitting: 57.6   Smokeless tobacco: Never   Tobacco comments:    pt smoked 5-6/cigs daily 12/30/2018  Substance and Sexual Activity   Alcohol use: Not Currently   Drug use: Never   Sexual activity: Never    Birth control/protection: Surgical  Other Topics Concern   Not on file  Social History Narrative   Not on file   Social Determinants of Health   Financial Resource Strain: Not on file  Food Insecurity: Not on file  Transportation Needs: Not on file  Physical Activity: Not on file  Stress: Not on file  Social Connections: Not on file  Intimate Partner Violence: Not on file    Physical Exam: Vital signs in last 24 hours: BP 140/71    Pulse 74    Temp (!) 97.3 F (36.3 C) (Temporal)    Resp 15    Ht '5\' 7"'  (1.702 m)    Wt 218 lb (98.9 kg)    SpO2 97%    BMI 34.14 kg/m  GEN: NAD EYE: Sclerae anicteric ENT: MMM CV: Non-tachycardic Pulm: No increased WOB GI: Soft NEURO:  Alert & Oriented   [name], MD [facility]   08/25/2021 3:00 PM

## 2021-08-29 ENCOUNTER — Telehealth: Payer: Self-pay

## 2021-08-29 NOTE — Telephone Encounter (Signed)
°  Follow up Call-  Call back number 08/25/2021  Post procedure Call Back phone  # [phone]  Permission to leave phone message Yes  Some recent data might be hidden     Patient questions:  Do you have a fever, pain , or abdominal swelling? No. Pain Score  0 *  Have you tolerated food without any problems? Yes.    Have you been able to return to your normal activities? Yes.    Do you have any questions about your discharge instructions: Diet   No. Medications  No. Follow up visit  No.  Do you have questions or concerns about your Care? No.  Actions: * If pain score is 4 or above: No action needed, pain <4.

## 2021-08-30 ENCOUNTER — Other Ambulatory Visit: Payer: Self-pay

## 2021-08-30 ENCOUNTER — Ambulatory Visit: Payer: Medicare Other

## 2021-08-30 DIAGNOSIS — Z7901 Long term (current) use of anticoagulants: Secondary | ICD-10-CM | POA: Diagnosis not present

## 2021-08-30 DIAGNOSIS — I253 Aneurysm of heart: Secondary | ICD-10-CM | POA: Diagnosis not present

## 2021-08-30 LAB — POCT INR: INR: 1.4 — AB (ref 2.0–3.0)

## 2021-08-30 NOTE — Patient Instructions (Signed)
TAKE 2 TABLETS TODAY and TOMORROW and then Continue taking 1 tablet by mouth daily except take 1.5 tablets Mon, Wed, Fri. Recheck INR in 2 weeks.

## 2021-08-31 ENCOUNTER — Encounter: Payer: Self-pay | Admitting: Internal Medicine

## 2021-09-14 ENCOUNTER — Other Ambulatory Visit: Payer: Self-pay

## 2021-09-14 ENCOUNTER — Ambulatory Visit: Payer: Medicare Other

## 2021-09-14 DIAGNOSIS — Z7901 Long term (current) use of anticoagulants: Secondary | ICD-10-CM

## 2021-09-14 DIAGNOSIS — I253 Aneurysm of heart: Secondary | ICD-10-CM | POA: Diagnosis not present

## 2021-09-14 LAB — POCT INR: INR: 2.3 (ref 2.0–3.0)

## 2021-09-14 NOTE — Patient Instructions (Signed)
Continue taking 1 tablet by mouth daily except take 1.5 tablets Mon, Wed, Fri. Recheck INR in 6 weeks.

## 2021-10-10 ENCOUNTER — Telehealth: Payer: Self-pay | Admitting: Cardiovascular Disease

## 2021-10-10 NOTE — Telephone Encounter (Signed)
Called patient, advised I have Jardiance 10 mg samples here for her, but did not have Entresto samples- she will be bringing by her patient assistance forms either tomorrow or Thursday and wanted to make RN aware.  I will route to RN for FYI. Thanks!

## 2021-10-10 NOTE — Telephone Encounter (Signed)
Patient calling the office for samples of medication:   1.  What medication and dosage are you requesting samples for?  sacubitril-valsartan (ENTRESTO) 49-51 MG empagliflozin (JARDIANCE) 10 MG TABS tablet  2.  Are you currently out of this medication? No   Patient states she is has paperwork to fill out for patient assistance getting the medications and has some questions about it. She also requests samples.

## 2021-10-16 ENCOUNTER — Other Ambulatory Visit: Payer: Self-pay | Admitting: *Deleted

## 2021-10-16 MED ORDER — SACUBITRIL-VALSARTAN 49-51 MG PO TABS: 1.0000 | ORAL_TABLET | Freq: Two times a day (BID) | ORAL | 3 refills | Status: DC

## 2021-10-19 ENCOUNTER — Telehealth: Payer: Self-pay | Admitting: *Deleted

## 2021-10-19 NOTE — Telephone Encounter (Signed)
Assistance paperwork for Desiree Harrison and Desiree Harrison has been faxed and sent to be scanned.

## 2021-10-22 ENCOUNTER — Other Ambulatory Visit: Payer: Self-pay | Admitting: Cardiovascular Disease

## 2021-10-26 ENCOUNTER — Other Ambulatory Visit: Payer: Self-pay

## 2021-10-26 ENCOUNTER — Ambulatory Visit: Payer: Medicare Other

## 2021-10-26 DIAGNOSIS — I253 Aneurysm of heart: Secondary | ICD-10-CM

## 2021-10-26 DIAGNOSIS — Z7901 Long term (current) use of anticoagulants: Secondary | ICD-10-CM | POA: Diagnosis not present

## 2021-10-26 LAB — POCT INR: INR: 2.3 (ref 2.0–3.0)

## 2021-10-26 NOTE — Patient Instructions (Signed)
Continue taking 1 tablet by mouth daily except take 1.5 tablets Mon, Wed, Fri. Recheck INR in 6 weeks.

## 2021-11-27 ENCOUNTER — Ambulatory Visit (INDEPENDENT_AMBULATORY_CARE_PROVIDER_SITE_OTHER): Payer: Medicare Other

## 2021-11-27 DIAGNOSIS — Z95810 Presence of automatic (implantable) cardiac defibrillator: Secondary | ICD-10-CM

## 2021-11-27 NOTE — Telephone Encounter (Signed)
Jardiance assistance has been approved through 10/27/22 ? ?Delene Loll has been approved through 09/09/22 ?

## 2021-11-28 LAB — CUP PACEART REMOTE DEVICE CHECK
Battery Remaining Longevity: 132 mo
Battery Remaining Percentage: 100 %
Brady Statistic RV Percent Paced: 0 %
Date Time Interrogation Session: 20230316121500
HighPow Impedance: 67 Ohm
Implantable Pulse Generator Implant Date: 20170217
Lead Channel Impedance Value: 837 Ohm
Lead Channel Pacing Threshold Amplitude: 0.7 V
Lead Channel Pacing Threshold Pulse Width: 0.4 ms
Lead Channel Setting Pacing Amplitude: 2 V
Lead Channel Setting Pacing Pulse Width: 0.4 ms
Lead Channel Setting Sensing Sensitivity: 0.5 mV
Pulse Gen Serial Number: 213123

## 2021-11-30 ENCOUNTER — Other Ambulatory Visit: Payer: Self-pay | Admitting: Cardiovascular Disease

## 2021-11-30 DIAGNOSIS — I5042 Chronic combined systolic (congestive) and diastolic (congestive) heart failure: Secondary | ICD-10-CM

## 2021-12-05 NOTE — Progress Notes (Signed)
Remote ICD transmission.   

## 2021-12-07 ENCOUNTER — Ambulatory Visit (INDEPENDENT_AMBULATORY_CARE_PROVIDER_SITE_OTHER): Payer: Medicare Other

## 2021-12-07 DIAGNOSIS — I253 Aneurysm of heart: Secondary | ICD-10-CM | POA: Diagnosis not present

## 2021-12-07 DIAGNOSIS — Z7901 Long term (current) use of anticoagulants: Secondary | ICD-10-CM

## 2021-12-07 LAB — POCT INR: INR: 2.1 (ref 2.0–3.0)

## 2021-12-07 NOTE — Patient Instructions (Signed)
Continue taking 1 tablet by mouth daily except take 1.5 tablets Mon, Wed, Fri. Recheck INR in 6 weeks. ?

## 2022-01-03 ENCOUNTER — Telehealth: Payer: Self-pay | Admitting: Cardiovascular Disease

## 2022-01-03 NOTE — Telephone Encounter (Signed)
Patient states  primary Dr Reymundo Poll would like for her to take Fluconazole  one dose for yeast infection. She has used doxycycline. ? ? Per patient ,Dr Reymundo Poll recommended patient to take  3 mg  ( 1 tablet) of warfarin  everyday this week instead of taking  4.5 mg ( 1 and 1/2 tablets)  on Monday , Wednesday  or Friday of this week.  ? ? Patient wanted to know if this would be okay ,since  [facility] does her  warfarin dosing ? ?Patient aware will defer to [facility] ?

## 2022-01-03 NOTE — Telephone Encounter (Signed)
Pt c/o medication issue: ? ?1. Name of Medication:  ?warfarin (COUMADIN) 3 MG tablet ? ?2. How are you currently taking this medication (dosage and times per day)?  ? ?3. Are you having a reaction (difficulty breathing--STAT)?  ? ?4. What is your medication issue?  ? ?Patient states she will be starting on an antibiotic and she would like to know if she needs to adjust her Warfarin. Please advise. ?

## 2022-01-03 NOTE — Telephone Encounter (Signed)
I spoke to the patient and scheduled her for INR check 5/1.  She will reduce Warfarin to 1 tablet Daily until I see her. ?

## 2022-01-08 ENCOUNTER — Ambulatory Visit: Payer: Medicare Other

## 2022-01-08 DIAGNOSIS — Z7901 Long term (current) use of anticoagulants: Secondary | ICD-10-CM

## 2022-01-08 DIAGNOSIS — I253 Aneurysm of heart: Secondary | ICD-10-CM | POA: Diagnosis not present

## 2022-01-08 LAB — POCT INR: INR: 2.4 (ref 2.0–3.0)

## 2022-01-08 NOTE — Patient Instructions (Signed)
Continue taking 1 tablet by mouth daily except take 1.5 tablets Mon, Wed, Fri. Recheck INR in 6 weeks. ?

## 2022-01-11 ENCOUNTER — Other Ambulatory Visit: Payer: Self-pay | Admitting: Cardiovascular Disease

## 2022-02-19 ENCOUNTER — Ambulatory Visit (INDEPENDENT_AMBULATORY_CARE_PROVIDER_SITE_OTHER): Payer: Medicare Other

## 2022-02-19 DIAGNOSIS — I253 Aneurysm of heart: Secondary | ICD-10-CM

## 2022-02-19 DIAGNOSIS — Z7901 Long term (current) use of anticoagulants: Secondary | ICD-10-CM

## 2022-02-19 LAB — POCT INR: INR: 2.5 (ref 2.0–3.0)

## 2022-02-19 NOTE — Patient Instructions (Signed)
Description   Continue taking 1 tablet by mouth daily except take 1.5 tablets Mondays, Wednesdays, and Fridays. Recheck INR in 6 weeks.

## 2022-02-26 ENCOUNTER — Ambulatory Visit (INDEPENDENT_AMBULATORY_CARE_PROVIDER_SITE_OTHER): Payer: Medicare Other

## 2022-02-26 DIAGNOSIS — Z95810 Presence of automatic (implantable) cardiac defibrillator: Secondary | ICD-10-CM

## 2022-02-26 LAB — CUP PACEART REMOTE DEVICE CHECK
Battery Remaining Longevity: 132 mo
Battery Remaining Percentage: 100 %
Brady Statistic RV Percent Paced: 0 %
Date Time Interrogation Session: 20230619021100
HighPow Impedance: 71 Ohm
Implantable Pulse Generator Implant Date: 20170217
Lead Channel Impedance Value: 945 Ohm
Lead Channel Pacing Threshold Amplitude: 0.7 V
Lead Channel Pacing Threshold Pulse Width: 0.4 ms
Lead Channel Setting Pacing Amplitude: 2 V
Lead Channel Setting Pacing Pulse Width: 0.4 ms
Lead Channel Setting Sensing Sensitivity: 0.5 mV
Pulse Gen Serial Number: 213123

## 2022-03-15 NOTE — Progress Notes (Signed)
Remote ICD transmission.   

## 2022-03-27 ENCOUNTER — Other Ambulatory Visit: Payer: Self-pay | Admitting: Internal Medicine

## 2022-03-27 DIAGNOSIS — Z1231 Encounter for screening mammogram for malignant neoplasm of breast: Secondary | ICD-10-CM

## 2022-04-02 ENCOUNTER — Ambulatory Visit: Payer: Medicare Other | Admitting: *Deleted

## 2022-04-02 DIAGNOSIS — Z7901 Long term (current) use of anticoagulants: Secondary | ICD-10-CM | POA: Diagnosis not present

## 2022-04-02 DIAGNOSIS — I253 Aneurysm of heart: Secondary | ICD-10-CM | POA: Diagnosis not present

## 2022-04-02 DIAGNOSIS — Z5181 Encounter for therapeutic drug level monitoring: Secondary | ICD-10-CM

## 2022-04-02 LAB — POCT INR: INR: 1.9 — AB (ref 2.0–3.0)

## 2022-04-02 NOTE — Patient Instructions (Signed)
Description   Take 2 tablets of warfarin today and then continue taking 1 tablet by mouth daily except take 1.5 tablets Mondays, Wednesdays, and Fridays. Recheck INR in 4 weeks.

## 2022-04-07 ENCOUNTER — Other Ambulatory Visit: Payer: Self-pay | Admitting: Cardiovascular Disease

## 2022-04-07 DIAGNOSIS — Z5181 Encounter for therapeutic drug level monitoring: Secondary | ICD-10-CM

## 2022-04-12 ENCOUNTER — Ambulatory Visit
Admission: RE | Admit: 2022-04-12 | Discharge: 2022-04-12 | Disposition: A | Discharge status: Home / Self Care | Payer: Medicare Other | Source: Ambulatory Visit | Attending: Internal Medicine | Admitting: Internal Medicine

## 2022-04-12 DIAGNOSIS — Z1231 Encounter for screening mammogram for malignant neoplasm of breast: Secondary | ICD-10-CM

## 2022-04-19 ENCOUNTER — Telehealth: Payer: Self-pay | Admitting: Cardiovascular Disease

## 2022-04-19 NOTE — Telephone Encounter (Signed)
Patient made aware and verbalized her understanding. 

## 2022-04-19 NOTE — Telephone Encounter (Signed)
Returned the call to the patient. She stated that she has been prescribed Molnupiravir for Covid and wants to make sure this does not interact with Coumadin.

## 2022-04-19 NOTE — Telephone Encounter (Signed)
  Pt c/o medication issue:  1. Name of Medication: covid medication   2. How are you currently taking this medication (dosage and times per day)?   3. Are you having a reaction (difficulty breathing--STAT)?   4. What is your medication issue? Pt said, she tested positive for covid and prescribed a medication. She wanted to know if she can take it with her coumadin

## 2022-04-19 NOTE — Telephone Encounter (Signed)
No interaction.  I hope she gets better quickly.

## 2022-04-30 ENCOUNTER — Ambulatory Visit: Payer: Medicare Other | Admitting: *Deleted

## 2022-04-30 DIAGNOSIS — I253 Aneurysm of heart: Secondary | ICD-10-CM

## 2022-04-30 DIAGNOSIS — Z7901 Long term (current) use of anticoagulants: Secondary | ICD-10-CM

## 2022-04-30 LAB — POCT INR: INR: 4 — AB (ref 2.0–3.0)

## 2022-04-30 NOTE — Patient Instructions (Signed)
Description   Hold your dose of warfarin today then continue taking 1 tablet by mouth daily except take 1.5 tablets Mondays, Wednesdays, and Fridays. Recheck INR in 3 weeks. [facility]in Clinic [phone]

## 2022-05-22 ENCOUNTER — Ambulatory Visit: Payer: Medicare Other | Attending: Cardiology | Admitting: *Deleted

## 2022-05-22 DIAGNOSIS — I253 Aneurysm of heart: Secondary | ICD-10-CM | POA: Diagnosis not present

## 2022-05-22 DIAGNOSIS — Z7901 Long term (current) use of anticoagulants: Secondary | ICD-10-CM | POA: Diagnosis not present

## 2022-05-22 LAB — POCT INR: INR: 2.4 (ref 2.0–3.0)

## 2022-05-22 NOTE — Patient Instructions (Signed)
Description   Continue taking 1 tablet by mouth daily except take 1.5 tablets Mondays, Wednesdays, and Fridays.  Recheck INR in 4 weeks. [facility]in Clinic [phone]      

## 2022-05-26 ENCOUNTER — Other Ambulatory Visit: Payer: Self-pay | Admitting: Cardiovascular Disease

## 2022-05-26 DIAGNOSIS — I5042 Chronic combined systolic (congestive) and diastolic (congestive) heart failure: Secondary | ICD-10-CM

## 2022-05-28 ENCOUNTER — Ambulatory Visit (INDEPENDENT_AMBULATORY_CARE_PROVIDER_SITE_OTHER): Payer: Medicare Other

## 2022-05-28 DIAGNOSIS — I5042 Chronic combined systolic (congestive) and diastolic (congestive) heart failure: Secondary | ICD-10-CM | POA: Diagnosis not present

## 2022-05-31 LAB — CUP PACEART REMOTE DEVICE CHECK
Battery Remaining Longevity: 126 mo
Battery Remaining Percentage: 100 %
Brady Statistic RV Percent Paced: 0 %
Date Time Interrogation Session: 20230921021100
HighPow Impedance: 73 Ohm
Implantable Pulse Generator Implant Date: 20170217
Lead Channel Impedance Value: 915 Ohm
Lead Channel Pacing Threshold Amplitude: 0.7 V
Lead Channel Pacing Threshold Pulse Width: 0.4 ms
Lead Channel Setting Pacing Amplitude: 2 V
Lead Channel Setting Pacing Pulse Width: 0.4 ms
Lead Channel Setting Sensing Sensitivity: 0.5 mV
Pulse Gen Serial Number: 213123

## 2022-06-08 NOTE — Progress Notes (Signed)
Remote ICD transmission.   

## 2022-06-18 ENCOUNTER — Ambulatory Visit (INDEPENDENT_AMBULATORY_CARE_PROVIDER_SITE_OTHER): Payer: Medicare Other

## 2022-06-18 ENCOUNTER — Encounter: Payer: Self-pay | Admitting: Cardiovascular Disease

## 2022-06-18 ENCOUNTER — Ambulatory Visit: Payer: Medicare Other | Attending: Cardiovascular Disease | Admitting: Cardiovascular Disease

## 2022-06-18 VITALS — BP 118/76 | HR 69 | Ht 67.0 in | Wt 211.0 lb

## 2022-06-18 DIAGNOSIS — I2510 Atherosclerotic heart disease of native coronary artery without angina pectoris: Secondary | ICD-10-CM

## 2022-06-18 DIAGNOSIS — D6869 Other thrombophilia: Secondary | ICD-10-CM | POA: Diagnosis not present

## 2022-06-18 DIAGNOSIS — I10 Essential (primary) hypertension: Secondary | ICD-10-CM

## 2022-06-18 DIAGNOSIS — E1159 Type 2 diabetes mellitus with other circulatory complications: Secondary | ICD-10-CM

## 2022-06-18 DIAGNOSIS — Z7901 Long term (current) use of anticoagulants: Secondary | ICD-10-CM | POA: Diagnosis not present

## 2022-06-18 DIAGNOSIS — E785 Hyperlipidemia, unspecified: Secondary | ICD-10-CM

## 2022-06-18 DIAGNOSIS — I253 Aneurysm of heart: Secondary | ICD-10-CM

## 2022-06-18 DIAGNOSIS — Z95810 Presence of automatic (implantable) cardiac defibrillator: Secondary | ICD-10-CM

## 2022-06-18 DIAGNOSIS — I5042 Chronic combined systolic (congestive) and diastolic (congestive) heart failure: Secondary | ICD-10-CM

## 2022-06-18 DIAGNOSIS — Z794 Long term (current) use of insulin: Secondary | ICD-10-CM

## 2022-06-18 DIAGNOSIS — E669 Obesity, unspecified: Secondary | ICD-10-CM

## 2022-06-18 LAB — POCT INR: INR: 2.7 (ref 2.0–3.0)

## 2022-06-18 MED ORDER — SPIRONOLACTONE 25 MG PO TABS: 12.5000 mg | ORAL_TABLET | Freq: Every day | ORAL | 3 refills | Status: DC

## 2022-06-18 NOTE — Progress Notes (Signed)
Cardiology Office Note:    Date:  06/18/2022   ID:  Desiree Harrison, DOB February 12, 1946, MRN [id number]  PCP:  Michael Boston, MD  Cardiologist:  Sanda Klein, MD  Electrophysiologist:  None   Referring MD: Michael Boston, MD   Chief Complaint  Patient presents with   Congestive Heart Failure   Coronary Artery Disease        icd check    History of Present Illness:    Desiree Harrison is a 76 y.o. 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  She is on chronic warfarin anticoagulation.  She also has diabetes mellitus requiring insulin, complicated by neuropathy, hypertension and hypercholesterolemia, history of gout.  To date, she has not received therapy from her defibrillator.    She had previously seen Dr. Sabino Donovan at [facility], most recent visit in August 2019.  There have been no cardiac events during the last year.  She had shingles in the posterior right thorax but it did not really hurt, maybe since she was already on gabapentin.  She had COVID and during that time had a transient increase her INR to 4.0 for just one check.  Prior to that had had 1 transient reading slightly subtherapeutic at 1.9, but all other readings since the beginning of 2023 have been in therapeutic range (77%).  She has not had any bleeding problems.  She denies problems with shortness of breath at rest or with activity, orthopnea, PND, lower extremity edema, chest pain at rest or with activity, palpitations, dizziness, syncope, bleeding, falls or injuries, defibrillator discharges.  Device function is normal.  She never requires ventricular pacing.   She has not had any episodes of VT either sustained or nonsustained.  All lead parameters are excellent.  Estimated general longevity is 10.5 years. Her last episode of nonsustained VT was in December 2020.  Programmed as a "shock box" with therapy beginning only at 200 bpm, but left a monitor zone at 170 bpm.  She has a history of cough with lisinopril.  She was intolerant to [place], but is doing well with Jardiance.  She was started on first day but did not tolerate it due to GI side effects.  Her initial myocardial infarction was in November 2016.  She did not have angina pectoris, but her myocardial infarction manifested as nausea and vomiting shortness of breath.  She presented late and she was found to have a persistently occluded LAD that was not amenable to PCI.  There were no other meaningful coronary stenoses.  She had an aneurysmal apex.  Roughly 3 months later she underwent implantation of a defibrillator(Dr. Remus Blake, single-chamber [place] serial [id number], right ventricular lead [id number], DFT testing was not performed).  She has not required hospitalizations for heart failure as far as I can tell has never had documented LV apical thrombus or embolic events, but there was considerable concern about this possibility because of the aneurysmal apex.  She has noticed that the majority of her left arm is larger than the right.  She has not had any lumps or tenderness in her breast and has never had breast cancer surgery.  There is no lymphadenopathy in her axilla.    Past Medical History:  Diagnosis Date   Acute systolic heart failure (HCC)    CAD in native artery    Cardiomyopathy ([place])    Gout    Hypertension    Long term (current) use of anticoagulants    S/P internal cardiac defibrillator procedure    placed 10/28/15   STEMI (ST elevation myocardial infarction) ([place])    Thyroid disease    Type 2 diabetes mellitus, with long-term current use of insulin  (HCC)    Ventricular aneurysm     Past Surgical History:  Procedure Laterality Date   ABDOMINAL HYSTERECTOMY     APPENDECTOMY     BREAST BIOPSY Left    CARDIAC CATHETERIZATION     CARDIAC DEFIBRILLATOR PLACEMENT  2017   CHOLECYSTECTOMY     COLONOSCOPY     VIDEO BRONCHOSCOPY WITH ENDOBRONCHIAL ULTRASOUND N/A 01/20/2019   Procedure: VIDEO BRONCHOSCOPY WITH BIOPSY;  Surgeon: Marshell Garfinkel, MD;  Location: [facility];  Service: Pulmonary;  Laterality: N/A;    Current Medications: Current Meds  Medication Sig   aspirin EC 81 MG tablet Take 81 mg by mouth daily.   atorvastatin (LIPITOR) 80 MG tablet TAKE 1 TABLET BY MOUTH EVERY DAY   Blood Glucose Monitoring Suppl (ONETOUCH VERIO) w/Device KIT Use to check FSBS three times a day. Dx: E11.59, Z79.4   carvedilol (COREG) 25 MG tablet Take 1 tablet (25 mg total) by mouth 2 (two) times daily with a meal.   empagliflozin (JARDIANCE) 10 MG TABS tablet 1 tablet   gabapentin (NEURONTIN) 100 MG capsule Take 2 capsules (200 mg total) by mouth 2 (two) times daily.   glucose blood (ONETOUCH VERIO) test strip Use to check FSBS three times a day. Dx: E11.59, Z79.4   HUMALOG MIX 50/50 KWIKPEN (50-50) 100 UNIT/ML Kwikpen Inject 26 Units into the skin 2 (two) times daily. Dx: E11.59, Z79.4   Insulin Pen Needle (BD PEN NEEDLE MICRO U/F) 32G X 6 MM MISC Use twice a day with Humalog Mix Kwikpen. Dx: E11.59, Z79.4   Lancet Devices (ONE TOUCH DELICA LANCING DEV) MISC Use to check FSBS three times a day. Dx: E11.59, Z79.4   levothyroxine (SYNTHROID) 50 MCG tablet TAKE 1 TABLET BY MOUTH EVERY DAY   omeprazole (PRILOSEC) 20 MG capsule Take 30- 60 min before your first and last meals of the day   sacubitril-valsartan (ENTRESTO) 49-51 MG Take 1 tablet by mouth 2 (two) times daily.   warfarin (COUMADIN) 3 MG tablet TAKE 1-2 TABLETS BY MOUTH DAILY OR AS DIRECTED BY COUMADIN CLINIC   [DISCONTINUED] spironolactone (ALDACTONE) 25 MG tablet Take 0.5 tablets (12.5 mg total) by  mouth daily. Schedule an appointment for further refills   [DISCONTINUED] TRULICITY 1.5 BT/5.9RC SOPN Inject 1.5 mg into the skin once a week.     Allergies:   Lisinopril, Metformin, and Sitagliptin   Social History   Socioeconomic History   Marital status: Widowed    Spouse name: Not on file   Number of children: Not on file   Years of education: Not on file   Highest education level: Not on file  Occupational History   Not on file  Tobacco Use   Smoking status: Former    Packs/day: 0.25    Types: Cigarettes    Quit date: 12/30/1963    Years since quitting: 58.5   Smokeless tobacco: Never   Tobacco comments:    pt smoked 5-6/cigs daily 12/30/2018  Substance  and Sexual Activity   Alcohol use: Not Currently   Drug use: Never   Sexual activity: Never    Birth control/protection: Surgical  Other Topics Concern   Not on file  Social History Narrative   Not on file   Social Determinants of Health   Financial Resource Strain: Not on file  Food Insecurity: Not on file  Transportation Needs: Not on file  Physical Activity: Not on file  Stress: Not on file  Social Connections: Not on file     Family History: The patient's family history includes Breast cancer in her mother; Coronary artery disease in her mother; Diabetes in her mother; Stroke in her father. There is no history of Colon cancer, Rectal cancer, or Stomach cancer.  The patient's mother had bypass surgery at age 17 but lived to be 75 years old.  Patient's grandmother died with a myocardial infarction at age 69.  ROS:   Please see the history of present illness.     All other systems are reviewed and are negative.   EKGs/Labs/Other Studies Reviewed:     EKG:  EKG is  ordered today.  It is very similar to previous tracings.  Shows normal sinus rhythm and Q waves V1-V3, nonspecific T wave changes in V3-V6, QTc 377 ms   Recent Labs: 07/18/2021: Hemoglobin 14.8; Platelets 185.0   11/18/2020 Hemoglobin A1c  7.7%, potassium 5.2, normal liver function test, TSH 3.79, INR 2.50  04/25/2022 Labs from PCP Hemoglobin A1c 6.4% Potassium 4.3, TSH 4.25, ALT 12, creatinine 1.19  Recent Lipid Panel    Component Value Date/Time   CHOL 104 03/10/2020 0958   TRIG 125 03/10/2020 0958   HDL 31 (L) 03/10/2020 0958   CHOLHDL 3.4 03/10/2020 0958   LDLCALC 50 03/10/2020 0958  11/18/2020 Cholesterol 109, HDL 29, LDL 56, triglycerides 118  04/25/2022 Cholesterol 91, HDL 17, LDL 55, triglycerides 95   Physical Exam:    VS:  BP 118/76   Pulse 69   Ht '5\' 7"'  (1.702 m)   Wt 211 lb (95.7 kg)   SpO2 98%   BMI 33.05 kg/m   Wt Readings from Last 3 Encounters:  06/18/22 211 lb (95.7 kg)  08/25/21 218 lb (98.9 kg)  07/18/21 218 lb (98.9 kg)    General: Alert, oriented x3, no distress, severely obese.  Healthy subclavian defibrillator site Head: no evidence of trauma, PERRL, EOMI, no exophtalmos or lid lag, no myxedema, no xanthelasma; normal ears, nose and oropharynx Neck: normal jugular venous pulsations and no hepatojugular reflux; brisk carotid pulses without delay and no carotid bruits Chest: clear to auscultation, no signs of consolidation by percussion or palpation, normal fremitus, symmetrical and full respiratory excursions Cardiovascular: normal position and quality of the apical impulse, regular rhythm, normal first and second heart sounds, no murmurs, rubs or gallops Abdomen: no tenderness or distention, no masses by palpation, no abnormal pulsatility or arterial bruits, normal bowel sounds, no hepatosplenomegaly Extremities: no clubbing, cyanosis or edema; 2+ radial, ulnar and brachial pulses bilaterally; 2+ right femoral, posterior tibial and dorsalis pedis pulses; 2+ left femoral, posterior tibial and dorsalis pedis pulses; no subclavian or femoral bruits Neurological: grossly nonfocal Psych: Normal mood and affect    ASSESSMENT:    1. Chronic combined systolic and diastolic heart failure  ([place])   2. Coronary artery disease involving native coronary artery of native heart without angina pectoris   3. Left ventricular aneurysm   4. Acquired thrombophilia ([place])   5. ICD (implantable cardioverter-defibrillator) in  place   6. Dyslipidemia (high LDL; low HDL)   7. Type 2 diabetes mellitus with other circulatory complication, with long-term current use of insulin ([place])   8. Essential hypertension   9. Mild obesity     PLAN:    In order of problems listed above:  CHF: Very well compensated, NYHA functional class I.  Clinically euvolemic.  On full guideline directed medical therapy with carvedilol at maximum dose, Entresto, Aldactone and Jardiance.  Does not require loop diuretics. CAD: Does not have chest pain but never had angina before her myocardial infarction (presented with nausea vomiting and dyspnea).  On aspirin and statin and beta-blocker. LV aneurysm: Without actual thromboembolic events. Warfarin: Denies bleeding complication.  In therapeutic range 77% of the time (had transiently elevated INR during episode of COVID).  Offered option to switch to Eliquis if warfarin becomes a burden or control becomes erratic. ICD: No evidence of either sustained or nonsustained VT in the last 3 years.  Normal device function.  Continue remote downloads every 3 months. HLP: Labs were checked at [place] recently.  Excellent LDL at 55, but very low HDL at 17. DM: On Jardiance and insulin.  Trulicity has been stopped.  Recent hemoglobin A1c excellent at 6.4%. HTN: Controlled. Severe obesity: Has lost 12 pounds in the last year.  Congratulated.   Medication Adjustments/Labs and Tests Ordered: Current medicines are reviewed at length with the patient today.  Concerns regarding medicines are outlined above.  Orders Placed This Encounter  Procedures   EKG 12-Lead   Meds ordered this encounter  Medications   spironolactone (ALDACTONE) 25 MG tablet    Sig: Take 0.5 tablets (12.5  mg total) by mouth daily.    Dispense:  45 tablet    Refill:  3    Patient Instructions  Medication Instructions:  No changes *If you need a refill on your cardiac medications before your next appointment, please call your pharmacy*   Lab Work: None ordered If you have labs (blood work) drawn today and your tests are completely normal, you will receive your results only by: [place] (if you have MyChart) OR A paper copy in the mail If you have any lab test that is abnormal or we need to change your treatment, we will call you to review the results.   Testing/Procedures: None ordered   Follow-Up: At [facility], you and your health needs are our priority.  As part of our continuing mission to provide you with exceptional heart care, we have created designated Provider Care Teams.  These Care Teams include your primary Cardiologist (physician) and Advanced Practice Providers (APPs -  Physician Assistants and Nurse Practitioners) who all work together to provide you with the care you need, when you need it.  We recommend signing up for the patient portal called "MyChart".  Sign up information is provided on this After Visit Summary.  MyChart is used to connect with patients for Virtual Visits (Telemedicine).  Patients are able to view lab/test results, encounter notes, upcoming appointments, etc.  Non-urgent messages can be sent to your provider as well.   To learn more about what you can do with MyChart, go to [email or web address].    Your next appointment:   12 month(s)  The format for your next appointment:   In Person  Provider:   Sanda Klein, MD      Important Information About Sugar         Signed, Sanda Klein, MD  06/18/2022  11:18 AM    [facility]

## 2022-06-18 NOTE — Patient Instructions (Signed)
Medication Instructions:  No changes *If you need a refill on your cardiac medications before your next appointment, please call your pharmacy*   Lab Work: None ordered If you have labs (blood work) drawn today and your tests are completely normal, you will receive your results only by: MyChart Message (if you have MyChart) OR A paper copy in the mail If you have any lab test that is abnormal or we need to change your treatment, we will call you to review the results.   Testing/Procedures: None ordered   Follow-Up: At [facility], you and your health needs are our priority.  As part of our continuing mission to provide you with exceptional heart care, we have created designated Provider Care Teams.  These Care Teams include your primary Cardiologist (physician) and Advanced Practice Providers (APPs -  Physician Assistants and Nurse Practitioners) who all work together to provide you with the care you need, when you need it.  We recommend signing up for the patient portal called "MyChart".  Sign up information is provided on this After Visit Summary.  MyChart is used to connect with patients for Virtual Visits (Telemedicine).  Patients are able to view lab/test results, encounter notes, upcoming appointments, etc.  Non-urgent messages can be sent to your provider as well.   To learn more about what you can do with MyChart, go to [email or web address].    Your next appointment:   12 month(s)  The format for your next appointment:   In Person  Provider:   Mihai Croitoru, MD      Important Information About Sugar       

## 2022-06-18 NOTE — Patient Instructions (Signed)
Description   Continue taking 1 tablet by mouth daily except take 1.5 tablets Mondays, Wednesdays, and Fridays.  Recheck INR in 5 weeks. Coumadin Clinic [phone]      

## 2022-06-19 ENCOUNTER — Ambulatory Visit: Payer: Medicare Other

## 2022-07-06 ENCOUNTER — Other Ambulatory Visit: Payer: Self-pay | Admitting: Cardiovascular Disease

## 2022-07-06 DIAGNOSIS — Z5181 Encounter for therapeutic drug level monitoring: Secondary | ICD-10-CM

## 2022-07-06 NOTE — Telephone Encounter (Signed)
Prescription refill request received for warfarin Lov: croitoru 06/18/2022 Next INR check: 11/14 Warfarin tablet strength: '3mg'$ 

## 2022-07-24 ENCOUNTER — Ambulatory Visit: Payer: Medicare Other | Attending: Internal Medicine

## 2022-07-24 ENCOUNTER — Telehealth: Payer: Self-pay | Admitting: Cardiovascular Disease

## 2022-07-24 DIAGNOSIS — I253 Aneurysm of heart: Secondary | ICD-10-CM | POA: Diagnosis not present

## 2022-07-24 DIAGNOSIS — Z7901 Long term (current) use of anticoagulants: Secondary | ICD-10-CM | POA: Diagnosis not present

## 2022-07-24 LAB — POCT INR: INR: 3.7 — AB (ref 2.0–3.0)

## 2022-07-24 NOTE — Telephone Encounter (Signed)
Patient is calling about some paper she left today at the front desk.

## 2022-07-24 NOTE — Telephone Encounter (Signed)
Returned call to patient who states that she wanted to make sure that patient assistance forms were received today at the office. Advised patient that I do have forms and will have them signed tomorrow by Dr. Loletha Grayer and will fax once completed. Advised patient to call back to office with any issues, questions, or concerns. Patient verbalized understanding.

## 2022-07-24 NOTE — Patient Instructions (Signed)
HOLD TONIGHT ONLY then Continue taking 1 tablet by mouth daily except take 1.5 tablets Mondays, Wednesdays, and Fridays. Recheck INR in 3 weeks. Coumadin Clinic [phone]

## 2022-07-25 NOTE — Telephone Encounter (Signed)
Completed PA for patient assistance application. Key: [id number]. Medication is covered.  Printed insurance cards and have faxed application to [employer].

## 2022-07-25 NOTE — Telephone Encounter (Signed)
Returned call to patient and made her aware that all needed information was faxed to the company. Patient aware and verbalized understanding.

## 2022-07-25 NOTE — Telephone Encounter (Signed)
Patient is calling stating she is brining a copy of her insurance information to the office today to be faxed in with this paperwork.

## 2022-08-15 ENCOUNTER — Ambulatory Visit: Payer: Medicare Other | Attending: Cardiology

## 2022-08-15 DIAGNOSIS — Z5181 Encounter for therapeutic drug level monitoring: Secondary | ICD-10-CM | POA: Diagnosis not present

## 2022-08-15 DIAGNOSIS — Z7901 Long term (current) use of anticoagulants: Secondary | ICD-10-CM

## 2022-08-15 DIAGNOSIS — I253 Aneurysm of heart: Secondary | ICD-10-CM | POA: Diagnosis not present

## 2022-08-15 LAB — POCT INR: INR: 3 (ref 2.0–3.0)

## 2022-08-15 NOTE — Patient Instructions (Signed)
Description   Continue taking 1 tablet by mouth daily except take 1.5 tablets Mondays, Wednesdays, and Fridays.  Recheck INR in 4 weeks. Coumadin Clinic [phone]

## 2022-08-27 ENCOUNTER — Ambulatory Visit (INDEPENDENT_AMBULATORY_CARE_PROVIDER_SITE_OTHER): Payer: Medicare Other

## 2022-08-27 DIAGNOSIS — I5042 Chronic combined systolic (congestive) and diastolic (congestive) heart failure: Secondary | ICD-10-CM | POA: Diagnosis not present

## 2022-08-27 LAB — CUP PACEART REMOTE DEVICE CHECK
Battery Remaining Longevity: 126 mo
Battery Remaining Percentage: 100 %
Brady Statistic RV Percent Paced: 0 %
Date Time Interrogation Session: 20231218021200
HighPow Impedance: 69 Ohm
Implantable Pulse Generator Implant Date: 20170217
Lead Channel Impedance Value: 907 Ohm
Lead Channel Pacing Threshold Amplitude: 0.7 V
Lead Channel Pacing Threshold Pulse Width: 0.4 ms
Lead Channel Setting Pacing Amplitude: 2 V
Lead Channel Setting Pacing Pulse Width: 0.4 ms
Lead Channel Setting Sensing Sensitivity: 0.5 mV
Pulse Gen Serial Number: 213123
Zone Setting Status: 755011

## 2022-08-28 ENCOUNTER — Other Ambulatory Visit: Payer: Self-pay | Admitting: Cardiovascular Disease

## 2022-08-28 DIAGNOSIS — Z5181 Encounter for therapeutic drug level monitoring: Secondary | ICD-10-CM

## 2022-08-29 NOTE — Telephone Encounter (Signed)
Last INR 08/15/2022 Last OV 06/18/2022 Warfarin Refill Sent

## 2022-09-12 ENCOUNTER — Ambulatory Visit: Payer: Medicare Other | Attending: Cardiovascular Disease

## 2022-09-12 DIAGNOSIS — Z5181 Encounter for therapeutic drug level monitoring: Secondary | ICD-10-CM | POA: Diagnosis not present

## 2022-09-12 DIAGNOSIS — Z7901 Long term (current) use of anticoagulants: Secondary | ICD-10-CM

## 2022-09-12 DIAGNOSIS — I253 Aneurysm of heart: Secondary | ICD-10-CM | POA: Diagnosis not present

## 2022-09-12 LAB — POCT INR: INR: 2.6 (ref 2.0–3.0)

## 2022-09-12 NOTE — Patient Instructions (Signed)
Description   Continue taking 1 tablet by mouth daily except take 1.5 tablets Mondays, Wednesdays, and Fridays.  Recheck INR in 5 weeks. Coumadin Clinic [phone]

## 2022-10-01 NOTE — Progress Notes (Signed)
Remote ICD transmission.   

## 2022-10-17 ENCOUNTER — Ambulatory Visit: Payer: Medicare Other | Attending: Cardiovascular Disease | Admitting: *Deleted

## 2022-10-17 DIAGNOSIS — I253 Aneurysm of heart: Secondary | ICD-10-CM | POA: Diagnosis not present

## 2022-10-17 DIAGNOSIS — Z7901 Long term (current) use of anticoagulants: Secondary | ICD-10-CM

## 2022-10-17 LAB — POCT INR: INR: 2 (ref 2.0–3.0)

## 2022-10-17 NOTE — Patient Instructions (Signed)
Description   Today take 2 tablets then continue taking 1 tablet by mouth daily except take 1.5 tablets Mondays, Wednesdays, and Fridays.  Recheck INR in 6 weeks. Coumadin Clinic [phone]

## 2022-11-23 ENCOUNTER — Telehealth: Payer: Self-pay

## 2022-11-23 NOTE — Telephone Encounter (Signed)
Paper Work Dropped Off:  Patient Asst Program  Date:11-23-22  Location of paper: Dr. Lurline Del Mailbox in back

## 2022-11-26 ENCOUNTER — Telehealth: Payer: Self-pay | Admitting: Emergency Medicine

## 2022-11-26 NOTE — Telephone Encounter (Signed)
Patient Assistance paperwork faxed for [facility]

## 2022-11-28 ENCOUNTER — Ambulatory Visit: Payer: Medicare Other | Attending: Cardiology

## 2022-11-28 DIAGNOSIS — Z7901 Long term (current) use of anticoagulants: Secondary | ICD-10-CM | POA: Diagnosis not present

## 2022-11-28 DIAGNOSIS — I253 Aneurysm of heart: Secondary | ICD-10-CM

## 2022-11-28 LAB — POCT INR: INR: 1.9 — AB (ref 2.0–3.0)

## 2022-11-28 NOTE — Patient Instructions (Signed)
Description   Today take 2 tablets then continue taking 1 tablet by mouth daily except take 1.5 tablets Mondays, Wednesdays, and Fridays.  Recheck INR in 6 weeks. Coumadin Clinic [phone]     

## 2022-11-29 ENCOUNTER — Ambulatory Visit (INDEPENDENT_AMBULATORY_CARE_PROVIDER_SITE_OTHER): Payer: Medicare Other

## 2022-11-29 DIAGNOSIS — I5042 Chronic combined systolic (congestive) and diastolic (congestive) heart failure: Secondary | ICD-10-CM | POA: Diagnosis not present

## 2022-11-29 LAB — CUP PACEART REMOTE DEVICE CHECK
Battery Remaining Longevity: 120 mo
Battery Remaining Percentage: 100 %
Brady Statistic RV Percent Paced: 0 %
Date Time Interrogation Session: 20240321021200
HighPow Impedance: 69 Ohm
Implantable Pulse Generator Implant Date: 20170217
Lead Channel Impedance Value: 808 Ohm
Lead Channel Pacing Threshold Amplitude: 0.7 V
Lead Channel Pacing Threshold Pulse Width: 0.4 ms
Lead Channel Setting Pacing Amplitude: 2 V
Lead Channel Setting Pacing Pulse Width: 0.4 ms
Lead Channel Setting Sensing Sensitivity: 0.5 mV
Pulse Gen Serial Number: 213123
Zone Setting Status: 755011

## 2022-12-17 ENCOUNTER — Telehealth: Payer: Self-pay | Admitting: Cardiovascular Disease

## 2022-12-17 NOTE — Telephone Encounter (Signed)
   1. Name of Medication:  empagliflozin (JARDIANCE) 10 MG TABS tablet     2. How are you currently taking this medication (dosage and times per day)? 1 tablet   3. Are you having a reaction (difficulty breathing--STAT)? No  4. What is your medication issue?  Pt stated that the form for patient assistance was filled out wrong. Pt stated that the form is missing a clear signature and initial from the doctor. Pt stated that it could be faxed to [phone]

## 2022-12-17 NOTE — Telephone Encounter (Signed)
Re-faxed page- Jardiance patient assistance form with providers signature, date, and initials [phone]

## 2023-01-01 NOTE — Progress Notes (Signed)
Remote ICD transmission.   

## 2023-01-09 ENCOUNTER — Ambulatory Visit: Payer: Medicare Other | Attending: Cardiovascular Disease

## 2023-01-09 DIAGNOSIS — I253 Aneurysm of heart: Secondary | ICD-10-CM | POA: Diagnosis not present

## 2023-01-09 DIAGNOSIS — Z7901 Long term (current) use of anticoagulants: Secondary | ICD-10-CM

## 2023-01-09 LAB — POCT INR: INR: 1.8 — AB (ref 2.0–3.0)

## 2023-01-09 NOTE — Patient Instructions (Signed)
Description   Today take 2 tablets then START taking 1 tablet by mouth daily except take 1.5 tablets Sunday, Mondays, Wednesdays, and Fridays.  Recheck INR in 4 weeks.  Coumadin Clinic [phone]

## 2023-01-12 ENCOUNTER — Other Ambulatory Visit: Payer: Self-pay | Admitting: Cardiovascular Disease

## 2023-01-12 DIAGNOSIS — Z5181 Encounter for therapeutic drug level monitoring: Secondary | ICD-10-CM

## 2023-02-06 ENCOUNTER — Ambulatory Visit: Payer: Medicare Other | Attending: Cardiovascular Disease

## 2023-02-06 DIAGNOSIS — I253 Aneurysm of heart: Secondary | ICD-10-CM | POA: Diagnosis not present

## 2023-02-06 DIAGNOSIS — Z7901 Long term (current) use of anticoagulants: Secondary | ICD-10-CM | POA: Diagnosis not present

## 2023-02-06 LAB — POCT INR: INR: 2.4 (ref 2.0–3.0)

## 2023-02-06 NOTE — Patient Instructions (Signed)
Description   Continue taking 1 tablet by mouth daily except take 1.5 tablets Sunday, Mondays, Wednesdays, and Fridays.  Recheck INR in 5 weeks.  Coumadin Clinic [phone]

## 2023-02-28 ENCOUNTER — Ambulatory Visit: Payer: Medicare Other

## 2023-02-28 DIAGNOSIS — I5042 Chronic combined systolic (congestive) and diastolic (congestive) heart failure: Secondary | ICD-10-CM

## 2023-02-28 LAB — CUP PACEART REMOTE DEVICE CHECK
Battery Remaining Longevity: 120 mo
Battery Remaining Percentage: 100 %
Brady Statistic RV Percent Paced: 0 %
Date Time Interrogation Session: 20240620021100
HighPow Impedance: 72 Ohm
Implantable Pulse Generator Implant Date: 20170217
Lead Channel Impedance Value: 870 Ohm
Lead Channel Pacing Threshold Amplitude: 0.7 V
Lead Channel Pacing Threshold Pulse Width: 0.4 ms
Lead Channel Setting Pacing Amplitude: 2 V
Lead Channel Setting Pacing Pulse Width: 0.4 ms
Lead Channel Setting Sensing Sensitivity: 0.5 mV
Pulse Gen Serial Number: 213123
Zone Setting Status: 755011

## 2023-03-13 ENCOUNTER — Ambulatory Visit: Payer: Medicare Other | Attending: Cardiovascular Disease | Admitting: *Deleted

## 2023-03-13 DIAGNOSIS — Z7901 Long term (current) use of anticoagulants: Secondary | ICD-10-CM

## 2023-03-13 DIAGNOSIS — I253 Aneurysm of heart: Secondary | ICD-10-CM | POA: Diagnosis not present

## 2023-03-13 LAB — POCT INR: INR: 3.1 — AB (ref 2.0–3.0)

## 2023-03-13 NOTE — Patient Instructions (Signed)
Description   Today take 12 tablet today then continue taking 1 tablet by mouth daily except take 1.5 tablets Sunday, Mondays, Wednesdays, and Fridays.  Recheck INR in 5 weeks.  Coumadin Clinic [phone]

## 2023-03-20 NOTE — Progress Notes (Signed)
Remote ICD transmission.   

## 2023-04-05 NOTE — Telephone Encounter (Signed)
error 

## 2023-04-11 ENCOUNTER — Telehealth: Payer: Self-pay

## 2023-04-11 NOTE — Telephone Encounter (Signed)
Pt called and stated she was prescribed Levofloxacin 500mg  daily and will start medication today. Advised pt to only take Warfarin 1 tablet daily until Monday and scheduled coumadin clinic appt for Monday, 04/15/23 at 10:30am. Pt verbalized understanding.

## 2023-04-13 ENCOUNTER — Emergency Department (HOSPITAL_COMMUNITY)
Admission: EM | Admit: 2023-04-13 | Discharge: 2023-04-13 | Disposition: A | Discharge status: Home / Self Care | Payer: Medicare Other | Attending: Emergency Medicine | Admitting: Emergency Medicine

## 2023-04-13 ENCOUNTER — Other Ambulatory Visit: Payer: Self-pay

## 2023-04-13 ENCOUNTER — Emergency Department (HOSPITAL_COMMUNITY): Payer: Medicare Other

## 2023-04-13 ENCOUNTER — Encounter (HOSPITAL_COMMUNITY): Payer: Self-pay | Admitting: *Deleted

## 2023-04-13 DIAGNOSIS — Z79899 Other long term (current) drug therapy: Secondary | ICD-10-CM | POA: Insufficient documentation

## 2023-04-13 DIAGNOSIS — E119 Type 2 diabetes mellitus without complications: Secondary | ICD-10-CM | POA: Insufficient documentation

## 2023-04-13 DIAGNOSIS — Z7982 Long term (current) use of aspirin: Secondary | ICD-10-CM | POA: Diagnosis not present

## 2023-04-13 DIAGNOSIS — J189 Pneumonia, unspecified organism: Secondary | ICD-10-CM | POA: Diagnosis not present

## 2023-04-13 DIAGNOSIS — I509 Heart failure, unspecified: Secondary | ICD-10-CM | POA: Insufficient documentation

## 2023-04-13 DIAGNOSIS — R059 Cough, unspecified: Secondary | ICD-10-CM | POA: Diagnosis present

## 2023-04-13 DIAGNOSIS — I110 Hypertensive heart disease with heart failure: Secondary | ICD-10-CM | POA: Diagnosis not present

## 2023-04-13 DIAGNOSIS — I2510 Atherosclerotic heart disease of native coronary artery without angina pectoris: Secondary | ICD-10-CM | POA: Diagnosis not present

## 2023-04-13 DIAGNOSIS — Z794 Long term (current) use of insulin: Secondary | ICD-10-CM | POA: Diagnosis not present

## 2023-04-13 DIAGNOSIS — E039 Hypothyroidism, unspecified: Secondary | ICD-10-CM | POA: Insufficient documentation

## 2023-04-13 DIAGNOSIS — Z20822 Contact with and (suspected) exposure to covid-19: Secondary | ICD-10-CM | POA: Diagnosis not present

## 2023-04-13 LAB — PROTIME-INR
INR: 2.2 — ABNORMAL HIGH (ref 0.8–1.2)
Prothrombin Time: 24.8 seconds — ABNORMAL HIGH (ref 11.4–15.2)

## 2023-04-13 LAB — BASIC METABOLIC PANEL
Anion gap: 8 (ref 5–15)
BUN: 18 mg/dL (ref 8–23)
CO2: 26 mmol/L (ref 22–32)
Calcium: 9 mg/dL (ref 8.9–10.3)
Chloride: 109 mmol/L (ref 98–111)
Creatinine, Ser: 1.37 mg/dL — ABNORMAL HIGH (ref 0.44–1.00)
GFR, Estimated: 40 mL/min — ABNORMAL LOW (ref >60)
Glucose, Bld: 78 mg/dL (ref 70–99)
Potassium: 4.5 mmol/L (ref 3.5–5.1)
Sodium: 143 mmol/L (ref 135–145)

## 2023-04-13 LAB — CBC WITH DIFFERENTIAL/PLATELET
Abs Immature Granulocytes: 0.01 10*3/uL (ref 0.00–0.07)
Basophils Absolute: 0 10*3/uL (ref 0.0–0.1)
Basophils Relative: 1 %
Eosinophils Absolute: 0.1 10*3/uL (ref 0.0–0.5)
Eosinophils Relative: 2 %
HCT: 49.2 % — ABNORMAL HIGH (ref 36.0–46.0)
Hemoglobin: 15.2 g/dL — ABNORMAL HIGH (ref 12.0–15.0)
Immature Granulocytes: 0 %
Lymphocytes Relative: 30 %
Lymphs Abs: 1.9 10*3/uL (ref 0.7–4.0)
MCH: 26.8 pg (ref 26.0–34.0)
MCHC: 30.9 g/dL (ref 30.0–36.0)
MCV: 86.6 fL (ref 80.0–100.0)
Monocytes Absolute: 0.6 10*3/uL (ref 0.1–1.0)
Monocytes Relative: 9 %
Neutro Abs: 3.6 10*3/uL (ref 1.7–7.7)
Neutrophils Relative %: 58 %
Platelets: 196 10*3/uL (ref 150–400)
RBC: 5.68 MIL/uL — ABNORMAL HIGH (ref 3.87–5.11)
RDW: 15.5 % (ref 11.5–15.5)
WBC: 6.2 10*3/uL (ref 4.0–10.5)
nRBC: 0 % (ref 0.0–0.2)

## 2023-04-13 LAB — TROPONIN I (HIGH SENSITIVITY)
Troponin I (High Sensitivity): 6 ng/L (ref <18)
Troponin I (High Sensitivity): 5 ng/L (ref <18)

## 2023-04-13 LAB — I-STAT CG4 LACTIC ACID, ED: Lactic Acid, Venous: 0.8 mmol/L (ref 0.5–1.9)

## 2023-04-13 LAB — SARS CORONAVIRUS 2 BY RT PCR: SARS Coronavirus 2 by RT PCR: NEGATIVE

## 2023-04-13 MED ORDER — SODIUM CHLORIDE 0.9 % IV BOLUS: 1000.0000 mL | Freq: Once | INTRAVENOUS | Status: DC

## 2023-04-13 MED ORDER — SODIUM CHLORIDE 0.9 % IV SOLN
1.0000 g | Freq: Once | INTRAVENOUS | Status: AC
  Administered 2023-04-13: 1 g via INTRAVENOUS
  Filled 2023-04-13: qty 10

## 2023-04-13 MED ORDER — SODIUM CHLORIDE 0.9 % IV BOLUS
500.0000 mL | Freq: Once | INTRAVENOUS | Status: AC
  Administered 2023-04-13: 500 mL via INTRAVENOUS

## 2023-04-13 MED ORDER — IOHEXOL 350 MG/ML SOLN
75.0000 mL | Freq: Once | INTRAVENOUS | Status: AC | PRN
  Administered 2023-04-13: 75 mL via INTRAVENOUS

## 2023-04-13 MED ORDER — DOXYCYCLINE HYCLATE 100 MG PO TABS
100.0000 mg | ORAL_TABLET | Freq: Once | ORAL | Status: AC
  Administered 2023-04-13: 100 mg via ORAL
  Filled 2023-04-13: qty 1

## 2023-04-13 MED ORDER — DOXYCYCLINE HYCLATE 100 MG PO CAPS: 100.0000 mg | ORAL_CAPSULE | Freq: Two times a day (BID) | ORAL | 0 refills | Status: DC

## 2023-04-13 NOTE — Discharge Instructions (Addendum)
Stop Levaquin and start Doxycycline tomorrow.  You will need a repeat CT of the chest in 1-2 months for follow up from your pneumonia.

## 2023-04-13 NOTE — ED Notes (Signed)
Patient verbalizes understanding of discharge instructions. Opportunity for questioning and answers were provided. Armband removed by staff, pt discharged from ED. Pt ambulatory to ED waiting room with steady gait.  

## 2023-04-13 NOTE — ED Triage Notes (Signed)
Patient presents to ed the with c/o low blood pressure , states she takes it everyday, was dx. With pneumonia on Wed and is still taking antibiotics. C/o still feeling weak. No pain

## 2023-04-13 NOTE — ED Provider Notes (Signed)
[place] EMERGENCY DEPARTMENT AT [facility] Provider Note   CSN: [id number] Arrival date & time: 04/13/23  1408     History  Chief Complaint  Patient presents with   Hypotension    Desiree Harrison is a 77 y.o. female.  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  Pt has had a cough for the past few days.  She was seen by her PCP on 7/31 and diagnosed with pna.  Pt has been on Levaquin since then.  She does not feel much better.  She is very weak.  Bp has been low.  She's been compliant with meds.  Pt does have a hx of CHF, but most recent ECHO showed EF of 50%.       Home Medications Prior to Admission medications   Medication Sig Start Date End Date Taking? Authorizing Provider  doxycycline (VIBRAMYCIN) 100 MG capsule Take 1 capsule (100 mg total) by mouth 2 (two) times daily. 04/13/23  Yes Jacalyn Lefevre, MD  aspirin EC 81 MG tablet Take 81 mg by mouth daily.    [provider]  atorvastatin (LIPITOR) 80 MG tablet TAKE 1 TABLET BY MOUTH EVERY DAY 02/11/20   Azalee Course, PA  Blood Glucose Monitoring Suppl (ONETOUCH VERIO) w/Device KIT Use to check FSBS three times a day. Dx: E11.59, Z79.4 12/31/19   Arvilla Market, MD  carvedilol (COREG) 25 MG tablet Take 1 tablet (25 mg total) by mouth 2 (two) times daily with a meal. 04/17/19   Croitoru, Mihai, MD  empagliflozin (JARDIANCE) 10 MG TABS tablet 1 tablet    [provider]  gabapentin (NEURONTIN) 100 MG capsule Take 2 capsules (200 mg total) by mouth 2 (two) times daily. 07/01/20   Arvilla Market, MD  glucose blood (ONETOUCH VERIO) test strip Use to check FSBS three times a day. Dx: E11.59, Z79.4 12/31/19   Arvilla Market, MD  HUMALOG MIX 50/50 KWIKPEN (50-50) 100 UNIT/ML Kwikpen Inject 26 Units into the skin 2 (two) times daily. Dx: E11.59, Z79.4 08/25/20   Arvilla Market, MD  Insulin Pen Needle (BD PEN NEEDLE MICRO U/F) 32G X 6 MM MISC Use twice a day with Humalog Mix Kwikpen. Dx: E11.59, Z79.4 08/25/20   Arvilla Market, MD  Lancet Devices (ONE TOUCH DELICA LANCING DEV) MISC Use to check FSBS three times a day. Dx: E11.59, Z79.4 12/31/19   Arvilla Market, MD  levothyroxine (SYNTHROID) 50 MCG tablet TAKE 1 TABLET BY MOUTH EVERY DAY 11/03/20   Arvilla Market, MD  omeprazole (PRILOSEC) 20 MG capsule Take 30- 60 min before your first and last meals of the day 12/30/18   Nyoka Cowden, MD  sacubitril-valsartan (ENTRESTO) 49-51 MG Take 1 tablet by mouth 2 (two) times daily. 10/16/21   Croitoru, Mihai, MD  spironolactone (ALDACTONE) 25 MG tablet Take 0.5 tablets (12.5 mg total) by mouth daily. 06/18/22   Croitoru, Mihai, MD  warfarin (COUMADIN) 3 MG tablet TAKE 1 TO 1&1/2 (1-1.5) TABLETS BY MOUTH DAILY OR AS DIRECTED BY COUMADIN CLINIC 01/14/23   Croitoru, Mihai, MD      Allergies    Lisinopril, Metformin, and Sitagliptin    Review of Systems   Review of Systems  Respiratory:  Positive for cough.   Neurological:  Positive for weakness.  All other systems reviewed and are negative.   Physical Exam Updated Vital  Signs BP 118/71   Pulse 60   Temp 98 F (36.7 C)   Resp (!) 21   Ht 5\' 7"  (1.702 m)   Wt 86.2 kg   SpO2 97%   BMI 29.76 kg/m  Physical Exam Vitals and nursing note reviewed.  Constitutional:      Appearance: Normal appearance. She is obese.  HENT:     Head: Normocephalic and atraumatic.     Right Ear: External ear normal.     Left Ear: External ear normal.     Nose: Nose normal.     Mouth/Throat:     Mouth: Mucous membranes are moist.     Pharynx: Oropharynx is clear.  Eyes:     Extraocular Movements: Extraocular movements intact.     Conjunctiva/sclera: Conjunctivae normal.     Pupils: Pupils are equal, round, and reactive to light.  Cardiovascular:     Rate and Rhythm: Normal rate and regular rhythm.      Pulses: Normal pulses.     Heart sounds: Normal heart sounds.  Pulmonary:     Effort: Pulmonary effort is normal.     Breath sounds: Normal breath sounds.  Abdominal:     General: Abdomen is flat. Bowel sounds are normal.     Palpations: Abdomen is soft.  Musculoskeletal:        General: Normal range of motion.     Cervical back: Normal range of motion and neck supple.  Skin:    General: Skin is warm.     Capillary Refill: Capillary refill takes less than 2 seconds.  Neurological:     General: No focal deficit present.     Mental Status: She is alert and oriented to person, place, and time.  Psychiatric:        Mood and Affect: Mood normal.        Behavior: Behavior normal.     ED Results / Procedures / Treatments   Labs (all labs ordered are listed, but only abnormal results are displayed) Labs Reviewed  CBC WITH DIFFERENTIAL/PLATELET - Abnormal; Notable for the following components:      Result Value   RBC 5.68 (*)    Hemoglobin 15.2 (*)    HCT 49.2 (*)    All other components within normal limits  BASIC METABOLIC PANEL - Abnormal; Notable for the following components:   Creatinine, Ser 1.37 (*)    GFR, Estimated 40 (*)    All other components within normal limits  PROTIME-INR - Abnormal; Notable for the following components:   Prothrombin Time 24.8 (*)    INR 2.2 (*)    All other components within normal limits  SARS CORONAVIRUS 2 BY RT PCR  CULTURE, BLOOD (ROUTINE X 2)  CULTURE, BLOOD (ROUTINE X 2)  I-STAT CG4 LACTIC ACID, ED  TROPONIN I (HIGH SENSITIVITY)  TROPONIN I (HIGH SENSITIVITY)    EKG EKG Interpretation Date/Time:  Saturday April 13 2023 14:32:11 EDT Ventricular Rate:  74 PR Interval:  170 QRS Duration:  82 QT Interval:  544 QTC Calculation: 603 R Axis:   93  Text Interpretation: Normal sinus rhythm Rightward axis Septal infarct , age undetermined Abnormal ECG When compared with ECG of 19-Jan-2019 06:47, PREVIOUS ECG IS PRESENT No  significant change since last tracing Confirmed by Jacalyn Lefevre [phone]) on 04/13/2023 3:23:37 PM  Radiology CT Angio Chest PE W and/or Wo Contrast  Result Date: 04/13/2023 CLINICAL DATA:  High clinical suspicion for PE EXAM: CT ANGIOGRAPHY CHEST WITH CONTRAST TECHNIQUE: Multidetector  CT imaging of the chest was performed using the standard protocol during bolus administration of intravenous contrast. Multiplanar CT image reconstructions and MIPs were obtained to evaluate the vascular anatomy. RADIATION DOSE REDUCTION: This exam was performed according to the departmental dose-optimization program which includes automated exposure control, adjustment of the mA and/or kV according to patient size and/or use of iterative reconstruction technique. CONTRAST:  75mL OMNIPAQUE IOHEXOL 350 MG/ML SOLN COMPARISON:  Previous studies including the chest radiograph done today FINDINGS: Cardiovascular: There are no intraluminal filling defects in central pulmonary artery branches. Evaluation of small peripheral pulmonary artery branches is limited by infiltrates and motion artifacts. Coronary artery calcifications are seen. Calcifications are seen in thoracic aorta. Contrast enhancement in the thoracic aorta is less than adequate to evaluate the lumen. Pacemaker battery is seen in the left infraclavicular region. Mediastinum/Nodes: There are subcentimeter nodes in mediastinum and hilar regions, possibly suggesting reactive hyperplasia. Lungs/Pleura: There are patchy nodular infiltrates in right upper lobe. In image 62 of series 6, there is 1.8 x 0.8 cm smooth marginated nodule in right middle lobe. There are patchy ground-glass infiltrates in mid and lower lung fields on both sides. There is no pleural effusion or pneumothorax. Upper Abdomen: There is moderate sized hiatal hernia. Surgical clips are seen in gallbladder fossa. Musculoskeletal: Degenerative changes are noted with encroachment of neural foramina in the  visualized lower cervical spine. Review of the MIP images confirms the above findings. IMPRESSION: There is no evidence of central pulmonary artery embolism. Coronary artery calcifications are seen. Aortic arteriosclerosis. Patchy nodular infiltrates are seen in the right upper lobe suggesting pneumonia. There are patchy ground-glass infiltrates in both mid and both lower lung fields suggesting interstitial pneumonia. There is 1.8 x 0.8 cm noncalcified smooth marginated nodule in right middle lobe. This may be part of inflammatory process or benign or neoplastic process.Short-term follow-up CT in 1-2 months after antibiotic treatment may be considered to re-evaluate this finding. Moderate-sized hiatal hernia. Cervical spondylosis with encroachment of neural foramina by bony spurs in the visualized lower cervical spine. Electronically Signed   By: Ernie Avena M.D.   On: 04/13/2023 17:35   DG Chest Portable 1 View  Result Date: 04/13/2023 CLINICAL DATA:  Hypotension. EXAM: PORTABLE CHEST 1 VIEW COMPARISON:  03/18/2019 FINDINGS: Left chest wall ICD noted with lead in the right ventricle. Normal cardiomediastinal contours. Chronic diffuse bronchial wall thickening is identified. No airspace consolidation. Within the right upper lobe there is a new nodular density measuring 1.2 cm. Visualized osseous structures are unremarkable. IMPRESSION: 1. Chronic bronchial wall thickening. 2. New 1.2 cm nodular density in the right upper lobe. Recommend further evaluation with chest CT. Electronically Signed   By: Signa Kell M.D.   On: 04/13/2023 15:50    Procedures Procedures    Medications Ordered in ED Medications  cefTRIAXone (ROCEPHIN) 1 g in sodium chloride 0.9 % 100 mL IVPB (has no administration in time range)  doxycycline (VIBRA-TABS) tablet 100 mg (has no administration in time range)  sodium chloride 0.9 % bolus 500 mL (0 mLs Intravenous Stopped 04/13/23 1712)  iohexol (OMNIPAQUE) 350 MG/ML  injection 75 mL (75 mLs Intravenous Contrast Given 04/13/23 1703)    ED Course/ Medical Decision Making/ A&P                                 Medical Decision Making Amount and/or Complexity of Data Reviewed Labs: ordered. Radiology: ordered.  Risk Prescription drug management.   This patient presents to the ED for concern of cough, this involves an extensive number of treatment options, and is a complaint that carries with it a high risk of complications and morbidity.  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  The pertinent results include:  cbc nl, bmp nl other than cr elevated at 1.37 (cr 1.19 on 05/25/21), inr 2.2; covid neg   Imaging Studies ordered:  I ordered imaging studies including cxr, ct chest I independently visualized and interpreted imaging which showed  CXR: Chronic bronchial wall thickening. 2. New 1.2 cm nodular density in the right upper lobe. Recommend further evaluation with chest CT.  Chest CT: There is no evidence of central pulmonary artery embolism. Coronary  artery calcifications are seen. Aortic arteriosclerosis.    Patchy nodular infiltrates are seen in the right upper lobe  suggesting pneumonia. There are patchy ground-glass infiltrates in  both mid and both lower lung fields suggesting interstitial  pneumonia.    There is 1.8 x 0.8 cm noncalcified smooth marginated nodule in right  middle lobe. This may be part of inflammatory process or benign or  neoplastic process.Short-term follow-up CT in 1-2 months after  antibiotic  treatment may be considered to re-evaluate this finding.    Moderate-sized hiatal hernia. Cervical spondylosis with encroachment  of neural foramina by bony spurs in the visualized lower cervical  spine.   I agree with the radiologist interpretation   Cardiac Monitoring:  The patient was maintained on a cardiac monitor.  I personally viewed and interpreted the cardiac monitored which showed an underlying rhythm of: nsr   Medicines ordered and prescription drug management:  I ordered medication including ns bolus  for sx  Reevaluation of the patient after these medicines showed that the patient improved I have reviewed the patients home medicines and have made adjustments as needed   Test Considered:  ct   Critical Interventions:  abx  Problem List / ED Course:  CAP:  pt has been on levaquin since 7/31.  Pna is not gone, so I will give her a dose of rocephin + doxy in the ED and have her start doxy.  Pt is able to ambulate with good pulse ox and she feels better.  Pt also told that she needs a repeat CT chest in 1-2 months.   Reevaluation:  After the interventions noted above, I reevaluated the patient and found that they have :improved   Social Determinants of Health:  Lives at home   Dispostion:  After consideration of the diagnostic results and the patients response to treatment, I feel that the patent would benefit from discharge with outpatient f/u.          Final Clinical Impression(s) / ED Diagnoses Final diagnoses:  Community acquired pneumonia, unspecified laterality    Rx / DC Orders ED Discharge Orders          Ordered    doxycycline (VIBRAMYCIN) 100 MG capsule  2 times daily        04/13/23 1816              Jacalyn Lefevre,  MD 04/13/23 1819

## 2023-04-13 NOTE — ED Notes (Signed)
Walked patient, Patients O2 sat stayed 93-94% and heart rate was 72-74bpm

## 2023-04-15 ENCOUNTER — Ambulatory Visit: Payer: Medicare Other | Attending: Cardiology | Admitting: *Deleted

## 2023-04-15 DIAGNOSIS — Z7901 Long term (current) use of anticoagulants: Secondary | ICD-10-CM

## 2023-04-15 DIAGNOSIS — I253 Aneurysm of heart: Secondary | ICD-10-CM | POA: Diagnosis not present

## 2023-04-15 LAB — POCT INR: INR: 2.6 (ref 2.0–3.0)

## 2023-04-15 NOTE — Patient Instructions (Addendum)
Description   Today take 1 tablet then continue taking 1 tablet by mouth daily except take 1.5 tablets Sunday, Mondays, Wednesdays, and Fridays. Recheck INR in 4 weeks.  [facility] [phone]

## 2023-04-17 ENCOUNTER — Ambulatory Visit: Payer: Medicare Other

## 2023-04-18 LAB — CULTURE, BLOOD (ROUTINE X 2)
Culture: NO GROWTH
Culture: NO GROWTH
Special Requests: ADEQUATE
Special Requests: ADEQUATE

## 2023-05-15 ENCOUNTER — Ambulatory Visit: Payer: Medicare Other | Attending: Cardiovascular Disease

## 2023-05-15 ENCOUNTER — Other Ambulatory Visit: Payer: Self-pay | Admitting: Internal Medicine

## 2023-05-15 DIAGNOSIS — I253 Aneurysm of heart: Secondary | ICD-10-CM | POA: Diagnosis not present

## 2023-05-15 DIAGNOSIS — Z7901 Long term (current) use of anticoagulants: Secondary | ICD-10-CM

## 2023-05-15 DIAGNOSIS — Z1231 Encounter for screening mammogram for malignant neoplasm of breast: Secondary | ICD-10-CM

## 2023-05-15 LAB — POCT INR: INR: 2.5 (ref 2.0–3.0)

## 2023-05-15 NOTE — Patient Instructions (Signed)
Description   Continue taking 1 tablet by mouth daily except take 1.5 tablets Sunday, Mondays, Wednesdays, and Fridays.  Recheck INR in 5 weeks.  Coumadin Clinic [phone]

## 2023-05-16 ENCOUNTER — Ambulatory Visit
Admission: RE | Admit: 2023-05-16 | Discharge: 2023-05-16 | Disposition: A | Discharge status: Home / Self Care | Payer: Medicare Other | Source: Ambulatory Visit | Attending: Internal Medicine | Admitting: Internal Medicine

## 2023-05-16 DIAGNOSIS — Z1231 Encounter for screening mammogram for malignant neoplasm of breast: Secondary | ICD-10-CM

## 2023-05-22 ENCOUNTER — Other Ambulatory Visit: Payer: Self-pay | Admitting: Internal Medicine

## 2023-05-22 DIAGNOSIS — R911 Solitary pulmonary nodule: Secondary | ICD-10-CM

## 2023-05-27 ENCOUNTER — Ambulatory Visit
Admission: RE | Admit: 2023-05-27 | Discharge: 2023-05-27 | Disposition: A | Discharge status: Home / Self Care | Payer: Medicare Other | Source: Ambulatory Visit | Attending: Internal Medicine | Admitting: Internal Medicine

## 2023-05-27 DIAGNOSIS — R911 Solitary pulmonary nodule: Secondary | ICD-10-CM

## 2023-05-30 ENCOUNTER — Ambulatory Visit (INDEPENDENT_AMBULATORY_CARE_PROVIDER_SITE_OTHER): Payer: Medicare Other

## 2023-05-30 DIAGNOSIS — I5042 Chronic combined systolic (congestive) and diastolic (congestive) heart failure: Secondary | ICD-10-CM

## 2023-05-30 LAB — CUP PACEART REMOTE DEVICE CHECK
Battery Remaining Longevity: 114 mo
Battery Remaining Percentage: 100 %
Brady Statistic RV Percent Paced: 0 %
Date Time Interrogation Session: 20240919021000
HighPow Impedance: 71 Ohm
Implantable Pulse Generator Implant Date: 20170217
Lead Channel Impedance Value: 814 Ohm
Lead Channel Pacing Threshold Amplitude: 0.7 V
Lead Channel Pacing Threshold Pulse Width: 0.4 ms
Lead Channel Setting Pacing Amplitude: 2 V
Lead Channel Setting Pacing Pulse Width: 0.4 ms
Lead Channel Setting Sensing Sensitivity: 0.5 mV
Pulse Gen Serial Number: 213123
Zone Setting Status: 755011

## 2023-06-10 NOTE — Progress Notes (Signed)
Remote ICD transmission.   

## 2023-06-19 ENCOUNTER — Ambulatory Visit: Payer: Medicare Other | Attending: Cardiovascular Disease

## 2023-06-19 DIAGNOSIS — Z7901 Long term (current) use of anticoagulants: Secondary | ICD-10-CM | POA: Diagnosis not present

## 2023-06-19 DIAGNOSIS — I253 Aneurysm of heart: Secondary | ICD-10-CM

## 2023-06-19 LAB — POCT INR: INR: 2.6 (ref 2.0–3.0)

## 2023-06-19 NOTE — Patient Instructions (Signed)
Description   Continue taking 1 tablet by mouth daily except take 1.5 tablets Sunday, Mondays, Wednesdays, and Fridays.  Recheck INR in 6 weeks.  [facility] [phone]

## 2023-06-26 ENCOUNTER — Other Ambulatory Visit: Payer: Self-pay | Admitting: Cardiovascular Disease

## 2023-06-26 DIAGNOSIS — I5042 Chronic combined systolic (congestive) and diastolic (congestive) heart failure: Secondary | ICD-10-CM

## 2023-06-27 ENCOUNTER — Ambulatory Visit: Payer: Medicare Other | Admitting: Pulmonary Disease

## 2023-06-27 ENCOUNTER — Encounter: Payer: Self-pay | Admitting: Pulmonary Disease

## 2023-06-27 VITALS — BP 124/68 | HR 91 | Temp 97.8°F | Ht 67.0 in | Wt 191.4 lb

## 2023-06-27 DIAGNOSIS — R918 Other nonspecific abnormal finding of lung field: Secondary | ICD-10-CM | POA: Diagnosis not present

## 2023-06-27 DIAGNOSIS — T17908A Unspecified foreign body in respiratory tract, part unspecified causing other injury, initial encounter: Secondary | ICD-10-CM | POA: Diagnosis not present

## 2023-06-27 DIAGNOSIS — R053 Chronic cough: Secondary | ICD-10-CM

## 2023-06-27 DIAGNOSIS — K449 Diaphragmatic hernia without obstruction or gangrene: Secondary | ICD-10-CM

## 2023-06-27 NOTE — Progress Notes (Signed)
Synopsis: Referred in October 2024 for pulmonary nodule by Melida Quitter, MD  Subjective:   PATIENT ID: Desiree Harrison GENDER: female DOB: 1946/05/28, MRN: [id number]  Chief Complaint  Patient presents with   Consult    Pulmonary nodules found on CT    This is a 77 year old female, past medical history of gout hypertension, history of STEMI type 2 diabetes.  She had a significant pneumonia was admitted in 2020 with a right upper lobe consolidation.  She had follow-up CT imaging in August 2024 that showed right upper lobe patchy infiltrates and a stable pulmonary nodule in the right middle lobe.  She had a repeat CT in September 2024 that demonstrated persistent patchy infiltrates and small varying size nodules on the right upper lobe from September.  Talking to the patient's daughter she states that the patient occasionally chokes on food and aspirates and gets strangled easily.  She does take a PPI.  She has had a colonoscopy in the past by Dr. Leonides Schanz but never had an EGD.    Past Medical History:  Diagnosis Date   Acute systolic heart failure (HCC)    CAD in native artery    Cardiomyopathy (HCC)    Gout    Hypertension    Long term (current) use of anticoagulants    S/P internal cardiac defibrillator procedure    placed 10/28/15   STEMI (ST elevation myocardial infarction) (HCC)    Thyroid disease    Type 2 diabetes mellitus, with long-term current use of insulin (HCC)    Ventricular aneurysm      Family History  Problem Relation Age of Onset   Breast cancer Mother    Coronary artery disease Mother    Diabetes Mother    Stroke Father    Colon cancer Neg Hx    Rectal cancer Neg Hx    Stomach cancer Neg Hx      Past Surgical History:  Procedure Laterality Date   ABDOMINAL HYSTERECTOMY     APPENDECTOMY     BREAST BIOPSY Left    CARDIAC CATHETERIZATION     CARDIAC DEFIBRILLATOR PLACEMENT  2017   CHOLECYSTECTOMY     COLONOSCOPY     VIDEO BRONCHOSCOPY WITH  ENDOBRONCHIAL ULTRASOUND N/A 01/20/2019   Procedure: VIDEO BRONCHOSCOPY WITH BIOPSY;  Surgeon: Chilton Greathouse, MD;  Location: [facility] OR;  Service: Pulmonary;  Laterality: N/A;    Social History   Socioeconomic History   Marital status: Widowed    Spouse name: Not on file   Number of children: Not on file   Years of education: Not on file   Highest education level: Not on file  Occupational History   Not on file  Tobacco Use   Smoking status: Former    Current packs/day: 0.00    Types: Cigarettes    Quit date: 12/30/1963    Years since quitting: 59.5   Smokeless tobacco: Never   Tobacco comments:    pt smoked 5-6/cigs daily 12/30/2018  Substance and Sexual Activity   Alcohol use: Not Currently   Drug use: Never   Sexual activity: Never    Birth control/protection: Surgical  Other Topics Concern   Not on file  Social History Narrative   Not on file   Social Determinants of Health   Financial Resource Strain: Not on file  Food Insecurity: Not on file  Transportation Needs: Not on file  Physical Activity: Not on file  Stress: Not on file  Social Connections: Not on file  Intimate Partner Violence: Not on file     Allergies  Allergen Reactions   Lisinopril Cough   Metformin     Other reaction(s): nausea   Sitagliptin     Other reaction(s): nausea     Outpatient Medications Prior to Visit  Medication Sig Dispense Refill   aspirin EC 81 MG tablet Take 81 mg by mouth daily.     atorvastatin (LIPITOR) 80 MG tablet TAKE 1 TABLET BY MOUTH EVERY DAY 90 tablet 3   Blood Glucose Monitoring Suppl (ONETOUCH VERIO) w/Device KIT Use to check FSBS three times a day. Dx: E11.59, Z79.4 1 kit 0   carvedilol (COREG) 25 MG tablet Take 1 tablet (25 mg total) by mouth 2 (two) times daily with a meal. 180 tablet 1   doxycycline (VIBRAMYCIN) 100 MG capsule Take 1 capsule (100 mg total) by mouth 2 (two) times daily. 14 capsule 0   empagliflozin (JARDIANCE) 10 MG TABS tablet 1 tablet      gabapentin (NEURONTIN) 100 MG capsule Take 2 capsules (200 mg total) by mouth 2 (two) times daily. 120 capsule 5   glucose blood (ONETOUCH VERIO) test strip Use to check FSBS three times a day. Dx: E11.59, Z79.4 100 each 2   HUMALOG MIX 50/50 KWIKPEN (50-50) 100 UNIT/ML Kwikpen Inject 26 Units into the skin 2 (two) times daily. Dx: E11.59, Z79.4 15 mL 5   Insulin Pen Needle (BD PEN NEEDLE MICRO U/F) 32G X 6 MM MISC Use twice a day with Humalog Mix Kwikpen. Dx: E11.59, Z79.4 100 each 5   Lancet Devices (ONE TOUCH DELICA LANCING DEV) MISC Use to check FSBS three times a day. Dx: E11.59, Z79.4 1 each 0   levothyroxine (SYNTHROID) 50 MCG tablet TAKE 1 TABLET BY MOUTH EVERY DAY 30 tablet 1   omeprazole (PRILOSEC) 20 MG capsule Take 30- 60 min before your first and last meals of the day     sacubitril-valsartan (ENTRESTO) 49-51 MG Take 1 tablet by mouth 2 (two) times daily. 180 tablet 3   spironolactone (ALDACTONE) 25 MG tablet TAKE 1/2 TABLET BY MOUTH EVERY DAY 45 tablet 3   warfarin (COUMADIN) 3 MG tablet TAKE 1 TO 1&1/2 (1-1.5) TABLETS BY MOUTH DAILY OR AS DIRECTED BY COUMADIN CLINIC 120 tablet 1   No facility-administered medications prior to visit.    Review of Systems  Constitutional:  Negative for chills, fever, malaise/fatigue and weight loss.  HENT:  Negative for hearing loss, sore throat and tinnitus.   Eyes:  Negative for blurred vision and double vision.  Respiratory:  Positive for cough and sputum production. Negative for hemoptysis, shortness of breath, wheezing and stridor.   Cardiovascular:  Negative for chest pain, palpitations, orthopnea, leg swelling and PND.  Gastrointestinal:  Negative for abdominal pain, constipation, diarrhea, heartburn, nausea and vomiting.  Genitourinary:  Negative for dysuria, hematuria and urgency.  Musculoskeletal:  Negative for joint pain and myalgias.  Skin:  Negative for itching and rash.  Neurological:  Negative for dizziness, tingling, weakness and  headaches.  Endo/Heme/Allergies:  Negative for environmental allergies. Does not bruise/bleed easily.  Psychiatric/Behavioral:  Negative for depression. The patient is not nervous/anxious and does not have insomnia.   All other systems reviewed and are negative.    Objective:  Physical Exam Vitals reviewed.  Constitutional:      General: She is not in acute distress.    Appearance: She is well-developed.  HENT:     Head: Normocephalic and atraumatic.  Eyes:  General: No scleral icterus.    Conjunctiva/sclera: Conjunctivae normal.     Pupils: Pupils are equal, round, and reactive to light.  Neck:     Vascular: No JVD.     Trachea: No tracheal deviation.  Cardiovascular:     Rate and Rhythm: Normal rate and regular rhythm.     Heart sounds: Normal heart sounds. No murmur heard. Pulmonary:     Effort: Pulmonary effort is normal. No tachypnea, accessory muscle usage or respiratory distress.     Breath sounds: No stridor. No wheezing, rhonchi or rales.  Abdominal:     General: There is no distension.     Palpations: Abdomen is soft.     Tenderness: There is no abdominal tenderness.  Musculoskeletal:        General: No tenderness.     Cervical back: Neck supple.  Lymphadenopathy:     Cervical: No cervical adenopathy.  Skin:    General: Skin is warm and dry.     Capillary Refill: Capillary refill takes less than 2 seconds.     Findings: No rash.  Neurological:     Mental Status: She is alert and oriented to person, place, and time.  Psychiatric:        Behavior: Behavior normal.      Vitals:   06/27/23 0957  BP: 124/68  Pulse: 91  Temp: 97.8 F (36.6 C)  TempSrc: Temporal  SpO2: 98%  Weight: 191 lb 6.4 oz (86.8 kg)  Height: 5\' 7"  (1.702 m)   98% on RA BMI Readings from Last 3 Encounters:  06/27/23 29.98 kg/m  04/13/23 29.76 kg/m  06/18/22 33.05 kg/m   Wt Readings from Last 3 Encounters:  06/27/23 191 lb 6.4 oz (86.8 kg)  04/13/23 190 lb (86.2 kg)   06/18/22 211 lb (95.7 kg)     CBC    Component Value Date/Time   WBC 6.2 04/13/2023 1446   RBC 5.68 (H) 04/13/2023 1446   HGB 15.2 (H) 04/13/2023 1446   HGB 14.3 06/13/2020 1004   HCT 49.2 (H) 04/13/2023 1446   HCT 43.7 06/13/2020 1004   PLT 196 04/13/2023 1446   PLT 214 06/13/2020 1004   MCV 86.6 04/13/2023 1446   MCV 81 06/13/2020 1004   MCH 26.8 04/13/2023 1446   MCHC 30.9 04/13/2023 1446   RDW 15.5 04/13/2023 1446   RDW 14.5 06/13/2020 1004   LYMPHSABS 1.9 04/13/2023 1446   LYMPHSABS 2.8 06/13/2020 1004   MONOABS 0.6 04/13/2023 1446   EOSABS 0.1 04/13/2023 1446   EOSABS 0.1 06/13/2020 1004   BASOSABS 0.0 04/13/2023 1446   BASOSABS 0.0 06/13/2020 1004     Chest Imaging:  05/27/2023 CT chest: Patient has right upper lobe groundglass nodular opacities within not much significant change from prior exam may be a little bit worse actually.  She has a new solid component of a nodule in that area.  And a stable 1.7 cm nodule in the right middle lobe.  Small hiatal hernia. The patient's images have been independently reviewed by me.    Pulmonary Functions Testing Results:     No data to display          FeNO:   Pathology:   Echocardiogram:   Heart Catheterization:     Assessment & Plan:     ICD-10-CM   1. Aspiration into airway, initial encounter  T17.908A SLP modified barium swallow    DG Swallowing Func-Speech Pathology    DG ESOPHAGUS W SINGLE CM (SOL OR  THIN BA)    CT Chest Wo Contrast    2. Chronic cough  R05.3 SLP modified barium swallow    DG Swallowing Func-Speech Pathology    DG ESOPHAGUS W SINGLE CM (SOL OR THIN BA)    CT Chest Wo Contrast    3. Hiatal hernia  K44.9     4. Pulmonary nodules  R91.8       Discussion:  This is a 77 year old female history of chronic cough has a hiatal hernia has trouble getting food down with esophageal dysphagia symptoms.  Found to have multiple pulmonary nodules.  Plan: I think the abnormalities seen  on her CT are likely related to recurrent aspiration into the airway.  She has a moderate size hiatal hernia that will need to be evaluated. Going to start with an esophageal imaging to include an esophagram. We will also have an MBSS. I sent a message over to Dr. Leonides Schanz to see the patient after the studies are done and consideration for EGD. Patient is agreeable to this plan. As for the abnormalities in the lung show repeat noncontrasted CT chest in December 2024.  She can follow-up with [place] after this.     Current Outpatient Medications:    aspirin EC 81 MG tablet, Take 81 mg by mouth daily., Disp: , Rfl:    atorvastatin (LIPITOR) 80 MG tablet, TAKE 1 TABLET BY MOUTH EVERY DAY, Disp: 90 tablet, Rfl: 3   Blood Glucose Monitoring Suppl (ONETOUCH VERIO) w/Device KIT, Use to check FSBS three times a day. Dx: E11.59, Z79.4, Disp: 1 kit, Rfl: 0   carvedilol (COREG) 25 MG tablet, Take 1 tablet (25 mg total) by mouth 2 (two) times daily with a meal., Disp: 180 tablet, Rfl: 1   doxycycline (VIBRAMYCIN) 100 MG capsule, Take 1 capsule (100 mg total) by mouth 2 (two) times daily., Disp: 14 capsule, Rfl: 0   empagliflozin (JARDIANCE) 10 MG TABS tablet, 1 tablet, Disp: , Rfl:    gabapentin (NEURONTIN) 100 MG capsule, Take 2 capsules (200 mg total) by mouth 2 (two) times daily., Disp: 120 capsule, Rfl: 5   glucose blood (ONETOUCH VERIO) test strip, Use to check FSBS three times a day. Dx: E11.59, Z79.4, Disp: 100 each, Rfl: 2   HUMALOG MIX 50/50 KWIKPEN (50-50) 100 UNIT/ML Kwikpen, Inject 26 Units into the skin 2 (two) times daily. Dx: E11.59, Z79.4, Disp: 15 mL, Rfl: 5   Insulin Pen Needle (BD PEN NEEDLE MICRO U/F) 32G X 6 MM MISC, Use twice a day with Humalog Mix Kwikpen. Dx: E11.59, Z79.4, Disp: 100 each, Rfl: 5   Lancet Devices (ONE TOUCH DELICA LANCING DEV) MISC, Use to check FSBS three times a day. Dx: E11.59, Z79.4, Disp: 1 each, Rfl: 0   levothyroxine (SYNTHROID) 50 MCG tablet, TAKE 1 TABLET BY MOUTH  EVERY DAY, Disp: 30 tablet, Rfl: 1   omeprazole (PRILOSEC) 20 MG capsule, Take 30- 60 min before your first and last meals of the day, Disp: , Rfl:    sacubitril-valsartan (ENTRESTO) 49-51 MG, Take 1 tablet by mouth 2 (two) times daily., Disp: 180 tablet, Rfl: 3   spironolactone (ALDACTONE) 25 MG tablet, TAKE 1/2 TABLET BY MOUTH EVERY DAY, Disp: 45 tablet, Rfl: 3   warfarin (COUMADIN) 3 MG tablet, TAKE 1 TO 1&1/2 (1-1.5) TABLETS BY MOUTH DAILY OR AS DIRECTED BY COUMADIN CLINIC, Disp: 120 tablet, Rfl: 1    Josephine Igo, DO [facility] 06/27/2023 10:47 AM

## 2023-06-27 NOTE — Patient Instructions (Signed)
Thank you for visiting Dr. Tonia Brooms at [facility]. Today we recommend the following:  Orders Placed This Encounter  Procedures   DG Swallowing Func-Speech Pathology   DG ESOPHAGUS W SINGLE CM (SOL OR THIN BA)   CT Chest Wo Contrast   SLP modified barium swallow   Return in about 2 months (around 08/27/2023) for with APP, after CT Chest.    Please do your part to reduce the spread of COVID-19.

## 2023-07-05 ENCOUNTER — Ambulatory Visit (HOSPITAL_COMMUNITY)
Admission: RE | Admit: 2023-07-05 | Discharge: 2023-07-05 | Disposition: A | Discharge status: Home / Self Care | Payer: Medicare Other | Source: Ambulatory Visit | Attending: Pulmonary Disease

## 2023-07-05 ENCOUNTER — Other Ambulatory Visit: Payer: Self-pay | Admitting: Pulmonary Disease

## 2023-07-05 ENCOUNTER — Ambulatory Visit (HOSPITAL_COMMUNITY)
Admission: RE | Admit: 2023-07-05 | Discharge: 2023-07-05 | Disposition: A | Discharge status: Home / Self Care | Payer: Medicare Other | Source: Ambulatory Visit | Attending: Internal Medicine | Admitting: Internal Medicine

## 2023-07-05 DIAGNOSIS — R053 Chronic cough: Secondary | ICD-10-CM

## 2023-07-05 DIAGNOSIS — T17908A Unspecified foreign body in respiratory tract, part unspecified causing other injury, initial encounter: Secondary | ICD-10-CM

## 2023-07-05 DIAGNOSIS — K449 Diaphragmatic hernia without obstruction or gangrene: Secondary | ICD-10-CM

## 2023-07-05 DIAGNOSIS — K219 Gastro-esophageal reflux disease without esophagitis: Secondary | ICD-10-CM | POA: Diagnosis not present

## 2023-07-05 DIAGNOSIS — R059 Cough, unspecified: Secondary | ICD-10-CM | POA: Diagnosis present

## 2023-07-05 DIAGNOSIS — R1312 Dysphagia, oropharyngeal phase: Secondary | ICD-10-CM | POA: Insufficient documentation

## 2023-07-05 DIAGNOSIS — R918 Other nonspecific abnormal finding of lung field: Secondary | ICD-10-CM

## 2023-07-06 NOTE — Progress Notes (Signed)
Desiree Harrison, the patient is aspirating. Please refer to outpatient speech therapy management and GI   Thanks,  BLI  Josephine Igo, DO [facility] 07/06/2023 9:08 AM

## 2023-07-08 ENCOUNTER — Telehealth: Payer: Self-pay

## 2023-07-08 ENCOUNTER — Ambulatory Visit: Payer: Medicare Other | Attending: Cardiovascular Disease | Admitting: Cardiovascular Disease

## 2023-07-08 ENCOUNTER — Encounter: Payer: Self-pay | Admitting: Cardiovascular Disease

## 2023-07-08 VITALS — BP 110/68 | HR 67 | Ht 67.0 in | Wt 190.6 lb

## 2023-07-08 DIAGNOSIS — I5042 Chronic combined systolic (congestive) and diastolic (congestive) heart failure: Secondary | ICD-10-CM | POA: Diagnosis not present

## 2023-07-08 DIAGNOSIS — D6869 Other thrombophilia: Secondary | ICD-10-CM | POA: Diagnosis not present

## 2023-07-08 DIAGNOSIS — E663 Overweight: Secondary | ICD-10-CM

## 2023-07-08 DIAGNOSIS — Z5181 Encounter for therapeutic drug level monitoring: Secondary | ICD-10-CM

## 2023-07-08 DIAGNOSIS — I2510 Atherosclerotic heart disease of native coronary artery without angina pectoris: Secondary | ICD-10-CM | POA: Diagnosis not present

## 2023-07-08 DIAGNOSIS — E785 Hyperlipidemia, unspecified: Secondary | ICD-10-CM

## 2023-07-08 DIAGNOSIS — I10 Essential (primary) hypertension: Secondary | ICD-10-CM

## 2023-07-08 DIAGNOSIS — Z794 Long term (current) use of insulin: Secondary | ICD-10-CM

## 2023-07-08 DIAGNOSIS — N1831 Chronic kidney disease, stage 3a: Secondary | ICD-10-CM

## 2023-07-08 DIAGNOSIS — Z95810 Presence of automatic (implantable) cardiac defibrillator: Secondary | ICD-10-CM

## 2023-07-08 DIAGNOSIS — I253 Aneurysm of heart: Secondary | ICD-10-CM

## 2023-07-08 DIAGNOSIS — E1159 Type 2 diabetes mellitus with other circulatory complications: Secondary | ICD-10-CM

## 2023-07-08 MED ORDER — WARFARIN SODIUM 3 MG PO TABS: ORAL_TABLET | ORAL | 3 refills | Status: DC

## 2023-07-08 NOTE — Progress Notes (Unsigned)
Cardiology Office Note:    Date:  07/11/2023   ID:  [name], DOB 1946-03-02, MRN [id number]  PCP:  Melida Quitter, MD  Cardiologist:  Thurmon Fair, MD  Electrophysiologist:  None   Referring MD: Melida Quitter, MD   Chief Complaint  Patient presents with   ICD check    History of Present Illness:    Desiree Harrison is a 77 y.o. 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  She is on chronic warfarin anticoagulation.  She also has diabetes mellitus requiring insulin, complicated by neuropathy, hypertension and hypercholesterolemia, history of gout.  To date, she has not received therapy from her defibrillator.    She had previously seen Dr. Ellamae Sia at [facility], most recent visit in August 2019.    She has had a good year from a cardiovascular point of view.  She has not required hospitalization for heart failure diuretic dose adjustment.  She denies any problems with chest pain at rest or with activity.  She does not have orthopnea, PND or lower extremity edema.  She has not had any defibrillator discharges, problems with palpitations or syncope.  She has not had any bleeding problems on warfarin.  She denies any recent falls or injuries.  She was seen in the emergency room in August when she had pneumonia.  She still has a residual cough, that it does not appear to be associated with laying down.  Device interrogation shows normal function.  Estimated battery longevity is 9 years and lead parameters are excellent.  She does not require ventricular pacing and has not had any episodes of high ventricular rates.  The  heart rate histogram distribution is normal.  From a metabolic point of view control is reasonably good.  Her LDL cholesterol is only 62 on atorvastatin 80 mg daily and her hemoglobin A1c is well-controlled at 6.1% on a combination of Jardiance and insulin.  Her HDL remains chronically low at 26.  Her INR is almost always in therapeutic range, most recently 2.6 earlier this month.  She has mild-moderate chronic kidney disease and the most recent creatinine was 1.37.  Her nephrologist is Dr. Marisue Humble.  She has a history of cough with lisinopril.  She was intolerant to [place], due to GI side effects, but is doing well with Jardiance.  Her initial myocardial infarction was in November 2016.  She did not have angina pectoris, but her myocardial infarction manifested as nausea and vomiting shortness of breath.  She presented late and she was found to have a persistently occluded LAD that was not amenable to PCI.  There were no other meaningful coronary stenoses.  She had an aneurysmal apex.  Roughly 3 months later she underwent implantation of a defibrillator(Dr. Gary Fleet, single-chamber [employer] Inogen EL ICD VR D140 serial [id number], right ventricular lead [id number], DFT testing was not performed).  She has not required hospitalizations for heart failure as far as I can tell has never had documented LV apical thrombus or embolic events, but there was considerable concern about this possibility because of the aneurysmal apex.  She has noticed that the majority of her left arm is larger than the right.  She has not had any lumps or tenderness in her breast and has never had breast cancer surgery.  There is no lymphadenopathy in her axilla.    Past Medical History:  Diagnosis Date   Acute systolic heart failure (HCC)    CAD in native artery    Cardiomyopathy (HCC)    Gout    Hypertension    Long term (current) use of anticoagulants    S/P internal cardiac defibrillator procedure    placed  10/28/15   STEMI (ST elevation myocardial infarction) (HCC)    Thyroid disease    Type 2 diabetes mellitus, with long-term current use of insulin (HCC)    Ventricular aneurysm     Past Surgical History:  Procedure Laterality Date   ABDOMINAL HYSTERECTOMY     APPENDECTOMY     BREAST BIOPSY Left    CARDIAC CATHETERIZATION     CARDIAC DEFIBRILLATOR PLACEMENT  2017   CHOLECYSTECTOMY     COLONOSCOPY     VIDEO BRONCHOSCOPY WITH ENDOBRONCHIAL ULTRASOUND N/A 01/20/2019   Procedure: VIDEO BRONCHOSCOPY WITH BIOPSY;  Surgeon: Chilton Greathouse, MD;  Location: [facility] OR;  Service: Pulmonary;  Laterality: N/A;    Current Medications: Current Meds  Medication Sig   aspirin EC 81 MG tablet Take 81 mg by mouth daily.   atorvastatin (LIPITOR) 80 MG tablet TAKE 1 TABLET BY MOUTH EVERY DAY   Blood Glucose Monitoring Suppl (ONETOUCH VERIO) w/Device KIT Use to check FSBS three times a day. Dx: E11.59, Z79.4   carvedilol (COREG) 25 MG tablet Take 1 tablet (25 mg total) by mouth 2 (two) times daily with a meal.   empagliflozin (JARDIANCE) 10 MG TABS tablet 1 tablet   gabapentin (NEURONTIN) 100 MG capsule Take 2 capsules (200 mg total) by mouth 2 (two) times daily.   glucose blood (ONETOUCH VERIO) test strip Use to check FSBS three times a day. Dx: E11.59, Z79.4   HUMALOG MIX 50/50 KWIKPEN (50-50) 100 UNIT/ML Kwikpen Inject 26 Units into the skin 2 (two) times daily. Dx: E11.59, Z79.4   Insulin Pen Needle (BD PEN NEEDLE MICRO U/F) 32G X 6 MM MISC Use twice a day with Humalog Mix Kwikpen. Dx: E11.59, Z79.4   Lancet Devices (ONE TOUCH DELICA LANCING DEV) MISC Use to check FSBS three times a day. Dx: E11.59, Z79.4   levothyroxine (SYNTHROID) 50 MCG tablet TAKE 1 TABLET BY MOUTH EVERY DAY   omeprazole (PRILOSEC) 20 MG capsule Take 30- 60 min before your first and last meals of the day   sacubitril-valsartan (ENTRESTO) 49-51 MG Take 1 tablet by mouth 2 (two) times daily.   spironolactone (ALDACTONE) 25 MG tablet  TAKE 1/2 TABLET BY MOUTH EVERY DAY   [DISCONTINUED] warfarin (COUMADIN) 3 MG tablet TAKE 1 TO 1&1/2 (1-1.5) TABLETS BY MOUTH DAILY OR AS DIRECTED BY COUMADIN CLINIC     Allergies:   Lisinopril, Metformin, and Sitagliptin   Social History   Socioeconomic History   Marital status: Widowed    Spouse name: Not on file   Number of children: Not on file   Years of education: Not on file   Highest education level: Not on file  Occupational History   Not on file  Tobacco Use   Smoking status: Former    Current packs/day: 0.00    Types: Cigarettes    Quit date: 12/30/1963    Years since quitting: 59.5   Smokeless tobacco: Never   Tobacco comments:    pt smoked 5-6/cigs daily 12/30/2018  Substance and  Sexual Activity   Alcohol use: Not Currently   Drug use: Never   Sexual activity: Never    Birth control/protection: Surgical  Other Topics Concern   Not on file  Social History Narrative   Not on file   Social Determinants of Health   Financial Resource Strain: Not on file  Food Insecurity: Not on file  Transportation Needs: Not on file  Physical Activity: Not on file  Stress: Not on file  Social Connections: Not on file     Family History: The patient's family history includes Breast cancer in her mother; Coronary artery disease in her mother; Diabetes in her mother; Stroke in her father. There is no history of Colon cancer, Rectal cancer, or Stomach cancer.  The patient's mother had bypass surgery at age 67 but lived to be 76 years old.  Patient's grandmother died with a myocardial infarction at age 61.  ROS:   Please see the history of present illness.     All other systems are reviewed and are negative.   EKGs/Labs/Other Studies Reviewed:     EKG:  EKG is not ordered today.  Personally reviewed the tracing from 04/13/2023 which shows sinus rhythm and Q waves in leads V1-V2, very similar to previous tracings.  She has very flat T waves across the precordial leads and it  is hard to accurately measure her QTc, but it appears to be prolonged (she was on Levaquin at the time for pneumonia).    Recent Labs: 04/13/2023: BUN 18; Creatinine, Ser 1.37; Hemoglobin 15.2; Platelets 196; Potassium 4.5; Sodium 143   11/18/2020 Hemoglobin A1c 7.7%, potassium 5.2, normal liver function test, TSH 3.79, INR 2.50  04/25/2022 Labs from PCP Hemoglobin A1c 6.4% Potassium 4.3, TSH 4.25, ALT 12, creatinine 1.19  05/07/2023 Hemoglobin 15.2, hemoglobin A1c 6.1%, potassium 4.7, creatinine 1.37, TSH 2.52  Recent Lipid Panel    Component Value Date/Time   CHOL 104 03/10/2020 0958   TRIG 125 03/10/2020 0958   HDL 31 (L) 03/10/2020 0958   CHOLHDL 3.4 03/10/2020 0958   LDLCALC 50 03/10/2020 0958  11/18/2020 Cholesterol 109, HDL 29, LDL 56, triglycerides 118  04/25/2022 Cholesterol 91, HDL 17, LDL 55, triglycerides 95  16/06/9603 Cholesterol 104, HDL 26, LDL 62, triglycerides 82  Physical Exam:    VS:  BP 110/68 (BP Location: Left Arm, Patient Position: Sitting, Cuff Size: Large)   Pulse 67   Ht 5\' 7"  (1.702 m)   Wt 190 lb 9.6 oz (86.5 kg)   SpO2 97%   BMI 29.85 kg/m   Wt Readings from Last 3 Encounters:  07/08/23 190 lb 9.6 oz (86.5 kg)  06/27/23 191 lb 6.4 oz (86.8 kg)  04/13/23 190 lb (86.2 kg)    General: Alert, oriented x3, no distress, severely obese.  Healthy subclavian defibrillator site Head: no evidence of trauma, PERRL, EOMI, no exophtalmos or lid lag, no myxedema, no xanthelasma; normal ears, nose and oropharynx Neck: normal jugular venous pulsations and no hepatojugular reflux; brisk carotid pulses without delay and no carotid bruits Chest: clear to auscultation, no signs of consolidation by percussion or palpation, normal fremitus, symmetrical and full respiratory excursions Cardiovascular: normal position and quality of the apical impulse, regular rhythm, normal first and second heart sounds, no murmurs, rubs or gallops Abdomen: no tenderness or  distention, no masses by palpation, no abnormal pulsatility or arterial bruits, normal bowel sounds, no hepatosplenomegaly Extremities: no clubbing, cyanosis or edema; 2+ radial, ulnar and brachial pulses bilaterally; 2+ right femoral, posterior  tibial and dorsalis pedis pulses; 2+ left femoral, posterior tibial and dorsalis pedis pulses; no subclavian or femoral bruits Neurological: grossly nonfocal Psych: Normal mood and affect    ASSESSMENT:    1. Chronic combined systolic and diastolic heart failure (HCC)   2. Coronary artery disease involving native coronary artery of native heart without angina pectoris   3. Left ventricular aneurysm   4. Acquired thrombophilia (HCC)   5. ICD (implantable cardioverter-defibrillator) in place   6. Dyslipidemia (high LDL; low HDL)   7. Type 2 diabetes mellitus with other circulatory complication, with long-term current use of insulin (HCC)   8. Essential hypertension   9. Stage 3a chronic kidney disease (HCC)   10. Overweight (BMI 25.0-29.9)   11. Encounter for therapeutic drug monitoring      PLAN:    In order of problems listed above:  CHF: Clinically euvolemic NYHA functional class I without the need for loop diuretics, almost full recovery of LVEF per last echo.  On full guideline directed medical therapy with carvedilol at maximum dose, Entresto, Aldactone and Jardiance.   CAD: Asymptomatic.  Does not have chest pain but never had angina before her myocardial infarction (presented with nausea vomiting and dyspnea).  On aspirin and statin and beta-blocker. LV aneurysm: Without actual thromboembolic events.  On chronic anticoagulation. Warfarin: Denies bleeding complication.  INR is almost always in therapeutic range.  Offered option to switch to Eliquis if warfarin becomes a burden or control becomes erratic. ICD:.  No evidence of any high ventricular rates.  Normal device function.  Continue remote downloads every 3 months. HLP: Excellent LDL  cholesterol, chronically low HDL DM: Well controlled HTN: Well-controlled on current medications CKD 3: Followed by Dr. Marisue Humble.  Stable creatinine. Overweight: She has done a great job with weight loss since she used to be severely obese.    Medication Adjustments/Labs and Tests Ordered: Current medicines are reviewed at length with the patient today.  Concerns regarding medicines are outlined above.  No orders of the defined types were placed in this encounter.  Meds ordered this encounter  Medications   warfarin (COUMADIN) 3 MG tablet    Sig: TAKE 1 TO 1&1/2 (1-1.5) TABLETS BY MOUTH DAILY OR AS DIRECTED BY COUMADIN CLINIC    Dispense:  135 tablet    Refill:  3    Patient Instructions  Medication Instructions:  No changes *If you need a refill on your cardiac medications before your next appointment, please call your pharmacy*  Follow-Up: At [facility], you and your health needs are our priority.  As part of our continuing mission to provide you with exceptional heart care, we have created designated Provider Care Teams.  These Care Teams include your primary Cardiologist (physician) and Advanced Practice Providers (APPs -  Physician Assistants and Nurse Practitioners) who all work together to provide you with the care you need, when you need it.  We recommend signing up for the patient portal called "MyChart".  Sign up information is provided on this After Visit Summary.  MyChart is used to connect with patients for Virtual Visits (Telemedicine).  Patients are able to view lab/test results, encounter notes, upcoming appointments, etc.  Non-urgent messages can be sent to your provider as well.   To learn more about what you can do with MyChart, go to [email or web address].    Your next appointment:   1 year(s)  Provider:   Thurmon Fair, MD        Signed, [facility] Jalani Rominger,  MD  07/11/2023 5:48 PM    [facility]

## 2023-07-08 NOTE — Patient Instructions (Signed)

## 2023-07-08 NOTE — Telephone Encounter (Signed)
Confirmed the appointment Friday 07/12/23 with Dr Leonides Schanz.

## 2023-07-10 ENCOUNTER — Other Ambulatory Visit: Payer: Self-pay | Admitting: Emergency Medicine

## 2023-07-10 DIAGNOSIS — T17908A Unspecified foreign body in respiratory tract, part unspecified causing other injury, initial encounter: Secondary | ICD-10-CM

## 2023-07-11 ENCOUNTER — Encounter: Payer: Self-pay | Admitting: Cardiovascular Disease

## 2023-07-12 ENCOUNTER — Telehealth: Payer: Self-pay

## 2023-07-12 ENCOUNTER — Encounter: Payer: Self-pay | Admitting: Internal Medicine

## 2023-07-12 ENCOUNTER — Ambulatory Visit: Payer: Medicare Other | Admitting: Internal Medicine

## 2023-07-12 VITALS — BP 126/78 | HR 70 | Ht 67.0 in | Wt 191.4 lb

## 2023-07-12 DIAGNOSIS — K219 Gastro-esophageal reflux disease without esophagitis: Secondary | ICD-10-CM | POA: Diagnosis not present

## 2023-07-12 DIAGNOSIS — R1310 Dysphagia, unspecified: Secondary | ICD-10-CM | POA: Diagnosis not present

## 2023-07-12 DIAGNOSIS — K449 Diaphragmatic hernia without obstruction or gangrene: Secondary | ICD-10-CM

## 2023-07-12 DIAGNOSIS — T17908A Unspecified foreign body in respiratory tract, part unspecified causing other injury, initial encounter: Secondary | ICD-10-CM

## 2023-07-12 NOTE — Telephone Encounter (Signed)
Well compensated heart failure.  Remote history of LV thrombus related to apical LV aneurysm, which has not been seen on subsequent echocardiograms.   Okay to hold Coumadin (I would not bridge with Lovenox).  Low risk for EGD.

## 2023-07-12 NOTE — Progress Notes (Signed)
Chief Complaint: Concern for aspiration  HPI : 89 year with history of CAD, HFrEF (TTE 50%) s/p ICD, left ventricular aneurysm on warfarin, GERD, and obesity presents with concern for aspiration  Interval History: Patient presents with her daughter to clinic today.  Patient was sent to [place] by pulmonology due to concern about aspiration based upon her most recent barium swallow test.  Her famliy has noted that she has been choking.  Patient states that her Prilosec has helped with this choking sensation. Denies chest burning or regurgitation. Denies overt dysphagia. She did have dysphagia before she Prilosec. Speech therapy has seen her and recommended that she uses chin tuck and eat with small bites.  There were no clear signs of oropharyngeal dysphagia.  She is having some coughing and clearing her throat. Denies prior EGD. She has been on Ozempic for about a year  Past Medical History:  Diagnosis Date   Acute systolic heart failure (HCC)    CAD in native artery    Cardiomyopathy (HCC)    Gout    Hypertension    Long term (current) use of anticoagulants    S/P internal cardiac defibrillator procedure    placed 10/28/15   STEMI (ST elevation myocardial infarction) (HCC)    Thyroid disease    Type 2 diabetes mellitus, with long-term current use of insulin (HCC)    Ventricular aneurysm      Past Surgical History:  Procedure Laterality Date   ABDOMINAL HYSTERECTOMY     APPENDECTOMY     BREAST BIOPSY Left    CARDIAC CATHETERIZATION     CARDIAC DEFIBRILLATOR PLACEMENT  2017   CHOLECYSTECTOMY     COLONOSCOPY     VIDEO BRONCHOSCOPY WITH ENDOBRONCHIAL ULTRASOUND N/A 01/20/2019   Procedure: VIDEO BRONCHOSCOPY WITH BIOPSY;  Surgeon: Chilton Greathouse, MD;  Location: [facility] OR;  Service: Pulmonary;  Laterality: N/A;   Family History  Problem Relation Age of Onset   Breast cancer Mother    Coronary artery disease Mother    Diabetes Mother    Stroke Father    Colon cancer Neg Hx    Rectal  cancer Neg Hx    Stomach cancer Neg Hx    Social History   Tobacco Use   Smoking status: Former    Current packs/day: 0.00    Types: Cigarettes    Quit date: 12/30/1963    Years since quitting: 59.5   Smokeless tobacco: Never   Tobacco comments:    pt smoked 5-6/cigs daily 12/30/2018  Substance Use Topics   Alcohol use: Not Currently   Drug use: Never   Current Outpatient Medications  Medication Sig Dispense Refill   aspirin EC 81 MG tablet Take 81 mg by mouth daily.     atorvastatin (LIPITOR) 80 MG tablet TAKE 1 TABLET BY MOUTH EVERY DAY 90 tablet 3   Blood Glucose Monitoring Suppl (ONETOUCH VERIO) w/Device KIT Use to check FSBS three times a day. Dx: E11.59, Z79.4 1 kit 0   carvedilol (COREG) 25 MG tablet Take 1 tablet (25 mg total) by mouth 2 (two) times daily with a meal. 180 tablet 1   empagliflozin (JARDIANCE) 10 MG TABS tablet 1 tablet     gabapentin (NEURONTIN) 100 MG capsule Take 2 capsules (200 mg total) by mouth 2 (two) times daily. 120 capsule 5   glucose blood (ONETOUCH VERIO) test strip Use to check FSBS three times a day. Dx: E11.59, Z79.4 100 each 2   HUMALOG MIX 50/50 KWIKPEN (50-50) 100  UNIT/ML Kwikpen Inject 26 Units into the skin 2 (two) times daily. Dx: E11.59, Z79.4 15 mL 5   Insulin Pen Needle (BD PEN NEEDLE MICRO U/F) 32G X 6 MM MISC Use twice a day with Humalog Mix Kwikpen. Dx: E11.59, Z79.4 100 each 5   Lancet Devices (ONE TOUCH DELICA LANCING DEV) MISC Use to check FSBS three times a day. Dx: E11.59, Z79.4 1 each 0   levothyroxine (SYNTHROID) 50 MCG tablet TAKE 1 TABLET BY MOUTH EVERY DAY 30 tablet 1   omeprazole (PRILOSEC) 20 MG capsule Take 30- 60 min before your first and last meals of the day     sacubitril-valsartan (ENTRESTO) 49-51 MG Take 1 tablet by mouth 2 (two) times daily. 180 tablet 3   spironolactone (ALDACTONE) 25 MG tablet TAKE 1/2 TABLET BY MOUTH EVERY DAY 45 tablet 3   warfarin (COUMADIN) 3 MG tablet TAKE 1 TO 1&1/2 (1-1.5) TABLETS BY MOUTH  DAILY OR AS DIRECTED BY COUMADIN CLINIC 135 tablet 3   No current facility-administered medications for this visit.   Allergies  Allergen Reactions   Lisinopril Cough   Metformin     Other reaction(s): nausea   Sitagliptin     Other reaction(s): nausea     Review of Systems: All systems reviewed and negative except where noted in HPI.   Physical Exam: BP 126/78 (BP Location: Left Arm, Patient Position: Sitting, Cuff Size: Normal)   Pulse 70   Ht 5\' 7"  (1.702 m)   Wt 191 lb 6 oz (86.8 kg)   SpO2 96%   BMI 29.97 kg/m  Constitutional: Pleasant,well-developed, female in no acute distress. HEENT: Normocephalic and atraumatic. Conjunctivae are normal. No scleral icterus. Cardiovascular: Normal rate, regular rhythm.  Pulmonary/chest: Effort normal and breath sounds normal. No wheezing, rales or rhonchi. Abdominal: Soft, nondistended, nontender. Bowel sounds active throughout. There are no masses palpable. No hepatomegaly. Extremities: No edema Neurological: Alert and oriented to person place and time. Skin: Skin is warm and dry. No rashes noted. Psychiatric: Normal mood and affect. Behavior is normal.  Labs 06/2020: Unremarkable CBC  Labs 05/2021: BMP with mildly elevated Cr of 1.19.  CT A/P w/o contrast 06/10/16: ABDOMEN:  .  Liver: Within normal limits.  .  Gallbladder/biliary: Cholecystectomy. Small calcification measuring 1 to 2 mm adjacent to the common hepatic duct. I suspect that this is extrinsic.  Marland Kitchen  Spleen: Within normal limits.  .  Pancreas: Within normal limits.  .  Adrenals: Within normal limits.  .  Kidneys: Bilateral 2 to 4 mm nonobstructing intrarenal calculi. No ureteral calculi.  Marland Kitchen  Peritoneum: Within normal limits.  .  Mesentery: Within normal limits.  .  Extraperitoneum: Retroperitoneal phleboliths on the left simulating ureteral calculi.  .  GI tract: Small hiatal hernia. Normal small intestine. Appendix not visualized. No appendiceal pathology. Mild  retained fecal material. Colonic diverticulosis no evidence of diverticulitis.  .  Vascular: Within normal limits.    TTE 02/20/17: SUMMARY  There is mild concentric left ventricular hypertrophy.  Left ventricular systolic function is normal.  LV ejection fraction = 50-55%.  Left ventricular filling pattern is impaired relaxation.  The left atrial size is normal.  There is no aortic stenosis.  There is trace mitral regurgitation.  There is mild tricuspid regurgitation.  There is no pericardial effusion.  Improvement in EF MUGA 18.5 % 09/15/15  Clinical correlation is recommended.   Chest CT w/o contrast 05/27/23: IMPRESSION: 1. Redemonstration of ground-glass and nodular opacities of the right upper  lobe, many solid nodules are new when compared with the recent prior exam, concerning for worsening infectious process, including atypical etiologies. Recommend short-term follow-up in 3 months to ensure resolution. 2. Solid nodule of the right middle lobe measuring up to 1.7 cm, unchanged in size when compared with Jan 17, 2019 prior and likely benign. 3. Small-to-moderate hiatal hernia. 4. Coronary artery calcifications and aortic Atherosclerosis (ICD10-I70.0).  MBS 07/05/23: IMPRESSION: *Moderate volume silent aspiration of high-density barium on the standing LPO position. *The examination was terminated after aspiration.  Colonoscopy 08/25/21: - Six 3 to 8 mm polyps in the sigmoid colon, in the transverse colon and in the ascending colon, removed with a cold snare. Resected and retrieved. - Diverticulosis in the sigmoid colon, in the descending colon and in the transverse colon. - Non- bleeding internal hemorrhoids. Path: Surgical [P], colon, ascending, transverse, sigmoid, polyp (6) - TUBULAR ADENOMA(S) - NEGATIVE FOR HIGH-GRADE DYSPLASIA OR MALIGNANCY  ASSESSMENT AND PLAN: GERD Concern for aspiration Possible dysphagia Hiatal hernia on prior imaging Patient presents with  concerns for aspiration based upon her most recent MBS that showed moderate volume silent aspiration of high density barium.  Her barium swallow study had to be aborted as a result of significant silent aspiration.  Speech therapy believes that she has intact oropharyngeal swallowing ability.  Patient does have a history of GERD, which historically has been well-controlled on Prilosec therapy.  However patient may have silent acid reflux that is leading to aspiration.  Alternatively she may be experiencing some esophageal dysphagia that she is not fully aware of.  Will plan for further evaluation with an EGD.  Patient's Ozempic would put her at increased risk for delayed gastric emptying and as a result acid reflux as well.  - GERD handout - Continue Prilosec - Continue to work with speech therapy - EGD [facility]. Will need warfarin held beforehand so will touch base with her cardiologist. Will have Ozempic held a week ago.  - Consider increasing PPI BID or stopping Ozempic in the future  Eulah Pont, MD  I spent 41 minutes of time, including in depth chart review, independent review of results as outlined above, communicating results with the patient directly, face-to-face time with the patient, coordinating care, ordering studies and medications as appropriate, and documentation.

## 2023-07-12 NOTE — Patient Instructions (Addendum)
You have been scheduled for an endoscopy. Please follow written instructions given to you at your visit today.  If you use inhalers (even only as needed), please bring them with you on the day of your procedure.  If you take any of the following medications, they will need to be adjusted prior to your procedure:   DO NOT TAKE 7 DAYS PRIOR TO TEST- Trulicity (dulaglutide) Ozempic, Wegovy (semaglutide) Mounjaro (tirzepatide) Bydureon Bcise (exanatide extended release)  DO NOT TAKE 1 DAY PRIOR TO YOUR TEST Rybelsus (semaglutide) Adlyxin (lixisenatide) Victoza (liraglutide) Byetta (exanatide)  You will be contaced by our office prior to your procedure for directions on holding your Coumadin/Warfarin.  If you do not hear from our office 1 week prior to your scheduled procedure, please call [phone] to discuss.   If your blood pressure at your visit was 140/90 or greater, please contact your primary care physician to follow up on this.  _______________________________________________________  If you are age 55 or older, your body mass index should be between 23-30. Your Body mass index is 29.97 kg/m. If this is out of the aforementioned range listed, please consider follow up with your Primary Care Provider.  If you are age 42 or younger, your body mass index should be between 19-25. Your Body mass index is 29.97 kg/m. If this is out of the aformentioned range listed, please consider follow up with your Primary Care Provider.   ________________________________________________________  The [facility] providers would like to encourage you to use [facility] to communicate with providers for non-urgent requests or questions.  Due to long hold times on the telephone, sending your provider a message by [facility] may be a faster and more efficient way to get a response.  Please allow 48 business hours for a response.  Please remember that this is for non-urgent requests.   _______________________________________________________  Due to recent changes in healthcare laws, you may see the results of your imaging and laboratory studies on MyChart before your provider has had a chance to review them.  We understand that in some cases there may be results that are confusing or concerning to you. Not all laboratory results come back in the same time frame and the provider may be waiting for multiple results in order to interpret others.  Please give [place] 48 hours in order for your provider to thoroughly review all the results before contacting the office for clarification of your results.   Thank you for entrusting me with your care and for choosing [facility], Dr. Eulah Pont

## 2023-07-12 NOTE — Telephone Encounter (Signed)
Dr. Royann Shivers,  You saw this patient on 07/08/2023. Per office protocol, will you please comment on medical clearance for EGD on 11/22?  Please route your response to [facility] Preop. I will communicate with requesting office once you have given recommendations.   Thank you!  Carlos Levering, NP

## 2023-07-12 NOTE — Telephone Encounter (Signed)
Please advise holding Coumadin prior to EGD.  Thank you!  DW

## 2023-07-12 NOTE — Telephone Encounter (Signed)
[facility] Pre-operative Risk Assessment     Request for surgical clearance:     Endoscopy Procedure  What type of surgery is being performed?     Endoscopy  When is this surgery scheduled?     08/02/23  What type of clearance is required ?   Pharmacy  Are there any medications that need to be held prior to surgery and how long? Coumadin 5 day's hold   Practice name and name of physician performing surgery?      [facility]  What is your office phone and fax number?      Phone- [phone]  Fax- [phone]  Anesthesia type (None, local, MAC, general) ?       MAC

## 2023-07-17 ENCOUNTER — Telehealth: Payer: Self-pay

## 2023-07-17 NOTE — Telephone Encounter (Signed)
Received message via Epic from cardiology advised patient okay to hold Warfarin 5 day's prior to procedure. Patient verbalized understanding

## 2023-07-17 NOTE — Telephone Encounter (Signed)
   Patient Name: Desiree Harrison  DOB: [birth year] MRN: [id number]  Primary Cardiologist: Thurmon Fair, MD  Chart reviewed as part of pre-operative protocol coverage. Given past medical history and time since last visit, based on ACC/AHA guidelines, [name] is at acceptable risk for the planned procedure without further cardiovascular testing.   The patient was advised that if she develops new symptoms prior to surgery to contact our office to arrange for a follow-up visit, and she verbalized understanding.  Patient can hold Coumadin 5 days prior to procedure and does not require bridging.  I will route this recommendation to the requesting party via Epic fax function and remove from pre-op pool.  Please call with questions.  Napoleon Form, Leodis Rains, NP 07/17/2023, 10:56 AM

## 2023-07-22 ENCOUNTER — Encounter: Payer: Self-pay | Admitting: Internal Medicine

## 2023-07-24 ENCOUNTER — Ambulatory Visit: Payer: Medicare Other | Attending: Pulmonary Disease | Admitting: Speech Pathology

## 2023-07-24 ENCOUNTER — Other Ambulatory Visit: Payer: Self-pay

## 2023-07-24 DIAGNOSIS — T17908A Unspecified foreign body in respiratory tract, part unspecified causing other injury, initial encounter: Secondary | ICD-10-CM | POA: Insufficient documentation

## 2023-07-24 DIAGNOSIS — R1310 Dysphagia, unspecified: Secondary | ICD-10-CM | POA: Diagnosis present

## 2023-07-24 DIAGNOSIS — R490 Dysphonia: Secondary | ICD-10-CM | POA: Diagnosis present

## 2023-07-24 NOTE — Therapy (Signed)
OUTPATIENT SPEECH LANGUAGE PATHOLOGY SWALLOW EVALUATION   Patient Name: Desiree Harrison MRN: [id number] DOB:[birth year], 77 y.o., female Today's Date: 07/24/2023  PCP: Melida Quitter, MD REFERRING PROVIDER: Josephine Igo, DO  END OF SESSION:  End of Session - 07/24/23 1458     Visit Number 1    Number of Visits --    Date for SLP Re-Evaluation --    SLP Start Time 1445    SLP Stop Time  1530    SLP Time Calculation (min) 45 min    Activity Tolerance Patient tolerated treatment well             Past Medical History:  Diagnosis Date   Acute systolic heart failure (HCC)    CAD in native artery    Cardiomyopathy (HCC)    Gout    Hard of hearing    Hypertension    Long term (current) use of anticoagulants    S/P internal cardiac defibrillator procedure    placed 10/28/15   STEMI (ST elevation myocardial infarction) (HCC)    Thyroid disease    Type 2 diabetes mellitus, with long-term current use of insulin (HCC)    Ventricular aneurysm    Past Surgical History:  Procedure Laterality Date   ABDOMINAL HYSTERECTOMY     APPENDECTOMY     BREAST BIOPSY Left    CARDIAC CATHETERIZATION     CARDIAC DEFIBRILLATOR PLACEMENT  2017   CHOLECYSTECTOMY     COLONOSCOPY     VIDEO BRONCHOSCOPY WITH ENDOBRONCHIAL ULTRASOUND N/A 01/20/2019   Procedure: VIDEO BRONCHOSCOPY WITH BIOPSY;  Surgeon: Chilton Greathouse, MD;  Location: [facility] OR;  Service: Pulmonary;  Laterality: N/A;   Patient Active Problem List   Diagnosis Date Noted   Diabetic renal disease (HCC) 05/03/2021   Body mass index (BMI) 39.0-39.9, adult 05/03/2021   History of myocardial infarction 05/03/2021   Hypertensive heart and chronic kidney disease with heart failure and stage 1 through stage 4 chronic kidney disease, or unspecified chronic kidney disease (HCC) 05/03/2021   Gastroesophageal reflux disease 05/03/2021   Nicotine dependence 05/03/2021   Polyneuropathy due to type 2 diabetes mellitus (HCC) 05/03/2021   Long  term (current) use of insulin (HCC) 05/03/2021   Aneurysm of heart 05/03/2021   Dupuytren's contracture of right hand 10/17/2020   Trigger finger, acquired 10/17/2020   Stage 3a chronic kidney disease (HCC) 12/10/2019   Pneumonia 01/18/2019   CAP (community acquired pneumonia) 01/17/2019   Sepsis due to pneumonia (HCC) 01/17/2019   DOE (dyspnea on exertion) 01/12/2019   Upper airway cough syndrome 12/30/2018   Pulmonary infiltrates on CXR 12/30/2018   Long term (current) use of anticoagulants 08/27/2018   Diabetes mellitus (HCC) 08/22/2018   Essential hypertension 08/22/2018   Ventricular aneurysm 08/22/2018   Long term current use of anticoagulant therapy 08/22/2018   Hypothyroidism 08/22/2018   Hyperlipidemia 08/22/2018   ICD (implantable cardioverter-defibrillator) in place 08/22/2018   Chronic combined systolic and diastolic heart failure (HCC) 08/21/2018   Coronary artery disease 08/25/2015    ONSET DATE: 07/10/2023(referral date)  REFERRING DIAG:  T17.908A (ICD-10-CM) - Aspiration into airway, initial encounter    THERAPY DIAG:  No diagnosis found.  Rationale for Evaluation and Treatment: Rehabilitation  SUBJECTIVE:   SUBJECTIVE STATEMENT: "I don't know why I'm here" Pt accompanied by: self  PERTINENT HISTORY: pt is a 77 yo female referred by Dr Tonia Brooms for MBS and esophagram. See results below. Pt has h/o CKD, pna, prior smoker stopped 1965, GERD, upper airway cough  syndrome, CAP, significant pna in 2020 requiring hospital stay. Chest imaging 05/27/2023 showed ground glass opacities of RUL - concerning for infectious process and small to moderate hiatal hernia - MD was concerned for chronic aspiration. OP MBS and esophagram ordered. Pt denies dysphagia nor coughing with po intake - however during the study she stated she doesn't use straws as straws cause her to choke easily.  PAIN:  Are you having pain? No  FALLS: Has patient fallen in last 6 months?  No  LIVING  ENVIRONMENT: Lives with: lives with their family Lives in: House/apartment  PLOF:  Level of assistance: Independent with ADLs, Independent with IADLs Employment: Retired  PATIENT GOALS: Unknown  OBJECTIVE:   DIAGNOSTIC FINDINGS: CT Chest 05/27/23: IMPRESSION: 1. Redemonstration of ground-glass and nodular opacities of the right upper lobe, many solid nodules are new when compared with the recent prior exam, concerning for worsening infectious process, including atypical etiologies. Recommend short-term follow-up in 3 months to ensure resolution. 2. Solid nodule of the right middle lobe measuring up to 1.7 cm, unchanged in size when compared with Jan 17, 2019 prior and likely benign. 3. Small-to-moderate hiatal hernia. 4. Coronary artery calcifications and aortic Atherosclerosis (ICD10-I70.0).  INSTRUMENTAL SWALLOW STUDY FINDINGS (MBSS) 07/05/23 Objective swallow impairments: Patient presents with functional oropharyngeal swallow ability without aspiration across all conistencies tested *thin, nectar, honey, pudding, cracker.  Her swallow is timely and strong without significant retention.  Trace penetration of thin observed and cleared with cued throat clearing. Chin tuck posture was effective to prevent penetration of thin liquids and advised pt conduct said strategy if finds helpful.      Of note, pt did aspirate on DG esophagram after MBS while standing upright and radiologist requested SLP to come to xray suite.  Testing with chin tuck again protected her airway again.  Highly recommend chin tuck posture with liquids for airway protection. Objective recommended compensations: chin tuck  COGNITION: Overall cognitive status: Within functional limits for tasks assessed Areas of impairment:  none Functional deficits:   SUBJECTIVE DYSPHAGIA REPORTS:   Reported symptoms: hoarseness  Current diet: regular and thin liquids  Co-morbid voice changes: No  FACTORS WHICH MAY INCREASE  RISK OF ADVERSE EVENT IN PRESENCE OF ASPIRATION:  General health: well appearing  Risk factors: none evident     ORAL MOTOR EXAMINATION: Overall status: WFL Comments:   CLINICAL SWALLOW ASSESSMENT:   Dentition: adequate natural dentition Vocal quality at baseline: hoarse Patient directly observed with POs: Yes: dysphagia 3 (soft) and thin liquids  Feeding: able to feed self Liquids provided by: cup [place] Swallow Protocol:  N/A Oral phase signs and symptoms:  none Pharyngeal phase signs and symptoms:  none   TODAY'S TREATMENT:  DATE:   07/24/23 (eval day): Reviewed results of MBSS and recommendation for chin tuck. Pt demonstrated appropriate chin tuck across all PO trials with mod I. She is employing chin tuck consistently since MBSS. Education re: LPR vs GERD and that PPI eliminates acid but non-acidic reflux can still affect voice and larynx. If hoarseness, throat clearing worsen or persist, I recommend she see Dr. Ashok Croon, laryngologist, to assess laryngeal function and r/o vocal fold pathology. She can consider an alginate  supplement, (Relfux Gourmet) to block reflux - ask your doctor about this. Provided information on reflux precautions   PATIENT EDUCATION: Education details: See Today's Treatment; See Patient Instructions, swallow precautions, diet modifications Person educated: Patient Education method: Explanation, Demonstration, Verbal cues, and Handouts Education comprehension: verbalized understanding and returned demonstration   ASSESSMENT:  CLINICAL IMPRESSION: Patient is a 77 y.o. female who was seen today for silent aspiration on esophagram. MBSS WNL. Desiree Harrison denies difficulty swallowing, denies difficulty with pills. She denies coughing and choking with meals and denies recurrent pna or illness. Desiree Harrison is mildly hoarse today  with occasional throat clears. PMH + GERD and hiatal hernia. She denies sx of reflux.  Hoarseness and throat clearing can indicate LPR. LPR can damage larynx even with PPI use. She is using chin tuck consistently. I do not recommend skilled ST at this time. If hoarseness and throat clearing persist or worsen, refer to Dr. Ashok Croon, laryngologist r/o laryngeal pathology.  Desiree Harrison was provided Dr. Leighton Roach information.   OBJECTIVE IMPAIRMENTS: include mild voice disorder and dysphagia. These impairments are limiting patient from  n/a . Factors affecting potential to achieve goals and functional outcome are  n/a . Patient will benefit from skilled SLP services to address above impairments and improve overall function.  REHAB POTENTIAL: Good    Desiree Harrison, Radene Journey, CCC-SLP 07/24/2023, 4:09 PM

## 2023-07-24 NOTE — Patient Instructions (Signed)
   Dr. Ashok Croon is a Scientist, forensic (specializes in your voice box) [phone]   If you continue to get pneumonia or have hoarseness - have her scope your voice box and throat. She is the only ENT doc in town who has the strobe to accurately assess and visualize your larynx  Chin down to swallow Small bites/sips Limit distractions with meals  Good oral hygiene is important if you are having chronic aspiration

## 2023-07-31 ENCOUNTER — Ambulatory Visit: Payer: Medicare Other | Attending: Cardiovascular Disease

## 2023-07-31 ENCOUNTER — Telehealth: Payer: Self-pay | Admitting: Cardiovascular Disease

## 2023-07-31 DIAGNOSIS — Z7901 Long term (current) use of anticoagulants: Secondary | ICD-10-CM

## 2023-07-31 DIAGNOSIS — I253 Aneurysm of heart: Secondary | ICD-10-CM | POA: Diagnosis not present

## 2023-07-31 LAB — POCT INR: INR: 1.5 — AB (ref 2.0–3.0)

## 2023-07-31 NOTE — Patient Instructions (Signed)
Description   Post procedure, restart Warfarin or as directed by your Dr. Warfarin 1 tablet by mouth daily except take 1.5 tablets Sunday, Mondays, Wednesdays, and Fridays.  Recheck INR 1 week post procedure  Coumadin Clinic [phone]

## 2023-07-31 NOTE — Telephone Encounter (Signed)
 Patient dropped off assistance forms In providers box

## 2023-08-01 NOTE — Telephone Encounter (Signed)
Faxed [employer] pt assistance to [employer] pt assistance for [employer] to [facility]

## 2023-08-02 ENCOUNTER — Ambulatory Visit: Payer: Medicare Other | Admitting: Internal Medicine

## 2023-08-02 ENCOUNTER — Encounter: Payer: Self-pay | Admitting: Internal Medicine

## 2023-08-02 VITALS — BP 133/82 | HR 95 | Temp 97.5°F | Resp 13 | Ht 67.0 in | Wt 191.0 lb

## 2023-08-02 DIAGNOSIS — K2289 Other specified disease of esophagus: Secondary | ICD-10-CM

## 2023-08-02 DIAGNOSIS — K222 Esophageal obstruction: Secondary | ICD-10-CM | POA: Diagnosis not present

## 2023-08-02 DIAGNOSIS — K219 Gastro-esophageal reflux disease without esophagitis: Secondary | ICD-10-CM

## 2023-08-02 DIAGNOSIS — K2950 Unspecified chronic gastritis without bleeding: Secondary | ICD-10-CM

## 2023-08-02 DIAGNOSIS — K449 Diaphragmatic hernia without obstruction or gangrene: Secondary | ICD-10-CM

## 2023-08-02 DIAGNOSIS — R1310 Dysphagia, unspecified: Secondary | ICD-10-CM

## 2023-08-02 DIAGNOSIS — K225 Diverticulum of esophagus, acquired: Secondary | ICD-10-CM

## 2023-08-02 DIAGNOSIS — K2100 Gastro-esophageal reflux disease with esophagitis, without bleeding: Secondary | ICD-10-CM | POA: Diagnosis not present

## 2023-08-02 DIAGNOSIS — K209 Esophagitis, unspecified without bleeding: Secondary | ICD-10-CM | POA: Diagnosis not present

## 2023-08-02 DIAGNOSIS — T17908A Unspecified foreign body in respiratory tract, part unspecified causing other injury, initial encounter: Secondary | ICD-10-CM

## 2023-08-02 MED ORDER — SODIUM CHLORIDE 0.9 % IV SOLN: 500.0000 mL | Freq: Once | INTRAVENOUS | Status: DC

## 2023-08-02 MED ORDER — OMEPRAZOLE 40 MG PO CPDR: 40.0000 mg | DELAYED_RELEASE_CAPSULE | Freq: Two times a day (BID) | ORAL | 3 refills | Status: DC

## 2023-08-02 NOTE — Progress Notes (Signed)
GASTROENTEROLOGY PROCEDURE H&P NOTE   Primary Care Physician: Melida Quitter, MD    Reason for Procedure:   GERD, concern for aspiration, possible dysphagia  Plan:    EGD  Patient is appropriate for endoscopic procedure(s) in the ambulatory ([facility]) setting.  The nature of the procedure, as well as the risks, benefits, and alternatives were carefully and thoroughly reviewed with the patient. Ample time for discussion and questions allowed. The patient understood, was satisfied, and agreed to proceed.     HPI: Desiree Harrison is a 77 y.o. female who presents for EGD for evaluation of GERD, concern for aspiration, possible dysphagia .  Patient was most recently seen in the Gastroenterology Clinic on 07/12/23.  No interval change in medical history since that appointment. Please refer to that note for full details regarding GI history and clinical presentation.   Past Medical History:  Diagnosis Date   Acute systolic heart failure (HCC)    CAD in native artery    Cardiomyopathy (HCC)    Gout    Hard of hearing    Hypertension    Long term (current) use of anticoagulants    S/P internal cardiac defibrillator procedure    placed 10/28/15   STEMI (ST elevation myocardial infarction) (HCC)    Thyroid disease    Type 2 diabetes mellitus, with long-term current use of insulin (HCC)    Ventricular aneurysm     Past Surgical History:  Procedure Laterality Date   ABDOMINAL HYSTERECTOMY     APPENDECTOMY     BREAST BIOPSY Left    CARDIAC CATHETERIZATION     CARDIAC DEFIBRILLATOR PLACEMENT  2017   CHOLECYSTECTOMY     COLONOSCOPY     VIDEO BRONCHOSCOPY WITH ENDOBRONCHIAL ULTRASOUND N/A 01/20/2019   Procedure: VIDEO BRONCHOSCOPY WITH BIOPSY;  Surgeon: Chilton Greathouse, MD;  Location: [facility] OR;  Service: Pulmonary;  Laterality: N/A;    Prior to Admission medications   Medication Sig Start Date End Date Taking? Authorizing Provider  aspirin EC 81 MG tablet Take 81 mg by mouth daily.     [provider]  atorvastatin (LIPITOR) 80 MG tablet TAKE 1 TABLET BY MOUTH EVERY DAY 02/11/20   Azalee Course, PA  Blood Glucose Monitoring Suppl (ONETOUCH VERIO) w/Device KIT Use to check FSBS three times a day. Dx: E11.59, Z79.4 12/31/19   Arvilla Market, MD  carvedilol (COREG) 25 MG tablet Take 1 tablet (25 mg total) by mouth 2 (two) times daily with a meal. 04/17/19   Croitoru, Mihai, MD  empagliflozin (JARDIANCE) 10 MG TABS tablet 1 tablet    [provider]  gabapentin (NEURONTIN) 100 MG capsule Take 2 capsules (200 mg total) by mouth 2 (two) times daily. 07/01/20   Arvilla Market, MD  glucose blood (ONETOUCH VERIO) test strip Use to check FSBS three times a day. Dx: E11.59, Z79.4 12/31/19   Arvilla Market, MD  HUMALOG MIX 50/50 KWIKPEN (50-50) 100 UNIT/ML Kwikpen Inject 26 Units into the skin 2 (two) times daily. Dx: E11.59, Z79.4 08/25/20   Arvilla Market, MD  Insulin Pen Needle (BD PEN NEEDLE MICRO U/F) 32G X 6 MM MISC Use twice a day with Humalog Mix Kwikpen. Dx: E11.59, Z79.4 08/25/20   Arvilla Market, MD  Lancet Devices (ONE TOUCH DELICA LANCING DEV) MISC Use to check FSBS three times a day. Dx: E11.59, Z79.4 12/31/19   Arvilla Market, MD  levothyroxine (SYNTHROID) 50 MCG tablet TAKE 1 TABLET BY MOUTH EVERY DAY 11/03/20  Arvilla Market, MD  omeprazole (PRILOSEC) 20 MG capsule Take 30- 60 min before your first and last meals of the day 12/30/18   Nyoka Cowden, MD  sacubitril-valsartan (ENTRESTO) 49-51 MG Take 1 tablet by mouth 2 (two) times daily. 10/16/21   Croitoru, Mihai, MD  Semaglutide-Weight Management 1 MG/0.5ML SOAJ Inject 1 mg into the skin.    [provider]  spironolactone (ALDACTONE) 25 MG tablet TAKE 1/2 TABLET BY MOUTH EVERY DAY 06/27/23   Croitoru, Mihai, MD  warfarin (COUMADIN) 3 MG tablet TAKE 1 TO 1&1/2 (1-1.5) TABLETS BY MOUTH DAILY OR AS DIRECTED BY COUMADIN CLINIC 07/08/23    Croitoru, Mihai, MD    Current Outpatient Medications  Medication Sig Dispense Refill   aspirin EC 81 MG tablet Take 81 mg by mouth daily.     atorvastatin (LIPITOR) 80 MG tablet TAKE 1 TABLET BY MOUTH EVERY DAY 90 tablet 3   Blood Glucose Monitoring Suppl (ONETOUCH VERIO) w/Device KIT Use to check FSBS three times a day. Dx: E11.59, Z79.4 1 kit 0   carvedilol (COREG) 25 MG tablet Take 1 tablet (25 mg total) by mouth 2 (two) times daily with a meal. 180 tablet 1   empagliflozin (JARDIANCE) 10 MG TABS tablet 1 tablet     gabapentin (NEURONTIN) 100 MG capsule Take 2 capsules (200 mg total) by mouth 2 (two) times daily. 120 capsule 5   glucose blood (ONETOUCH VERIO) test strip Use to check FSBS three times a day. Dx: E11.59, Z79.4 100 each 2   HUMALOG MIX 50/50 KWIKPEN (50-50) 100 UNIT/ML Kwikpen Inject 26 Units into the skin 2 (two) times daily. Dx: E11.59, Z79.4 15 mL 5   Insulin Pen Needle (BD PEN NEEDLE MICRO U/F) 32G X 6 MM MISC Use twice a day with Humalog Mix Kwikpen. Dx: E11.59, Z79.4 100 each 5   Lancet Devices (ONE TOUCH DELICA LANCING DEV) MISC Use to check FSBS three times a day. Dx: E11.59, Z79.4 1 each 0   levothyroxine (SYNTHROID) 50 MCG tablet TAKE 1 TABLET BY MOUTH EVERY DAY 30 tablet 1   omeprazole (PRILOSEC) 20 MG capsule Take 30- 60 min before your first and last meals of the day     sacubitril-valsartan (ENTRESTO) 49-51 MG Take 1 tablet by mouth 2 (two) times daily. 180 tablet 3   Semaglutide-Weight Management 1 MG/0.5ML SOAJ Inject 1 mg into the skin.     spironolactone (ALDACTONE) 25 MG tablet TAKE 1/2 TABLET BY MOUTH EVERY DAY 45 tablet 3   warfarin (COUMADIN) 3 MG tablet TAKE 1 TO 1&1/2 (1-1.5) TABLETS BY MOUTH DAILY OR AS DIRECTED BY COUMADIN CLINIC 135 tablet 3   Current Facility-Administered Medications  Medication Dose Route Frequency Provider Last Rate Last Admin   0.9 %  sodium chloride infusion  500 mL Intravenous Once Imogene Burn, MD        Allergies as of  08/02/2023 - Review Complete 08/02/2023  Allergen Reaction Noted   Lisinopril Cough 08/08/2015   Metformin  04/05/2021   Sitagliptin  04/05/2021    Family History  Problem Relation Age of Onset   Breast cancer Mother    Coronary artery disease Mother    Diabetes Mother    Stroke Father    Colon cancer Neg Hx    Rectal cancer Neg Hx    Stomach cancer Neg Hx     Social History   Socioeconomic History   Marital status: Widowed    Spouse name: Not on file  Number of children: Not on file   Years of education: Not on file   Highest education level: Not on file  Occupational History   Not on file  Tobacco Use   Smoking status: Former    Current packs/day: 0.00    Types: Cigarettes    Quit date: 12/30/1963    Years since quitting: 59.6   Smokeless tobacco: Never   Tobacco comments:    pt smoked 5-6/cigs daily 12/30/2018  Substance and Sexual Activity   Alcohol use: Not Currently   Drug use: Never   Sexual activity: Never    Birth control/protection: Surgical  Other Topics Concern   Not on file  Social History Narrative   Not on file   Social Determinants of Health   Financial Resource Strain: Not on file  Food Insecurity: Not on file  Transportation Needs: Not on file  Physical Activity: Not on file  Stress: Not on file  Social Connections: Not on file  Intimate Partner Violence: Not on file    Physical Exam: Vital signs in last 24 hours: BP (!) 148/86   Pulse 66   Temp (!) 97.5 F (36.4 C) (Temporal)   Ht 5\' 7"  (1.702 m)   Wt 191 lb (86.6 kg)   SpO2 97%   BMI 29.91 kg/m  GEN: NAD EYE: Sclerae anicteric ENT: MMM CV: Non-tachycardic Pulm: No increased WOB GI: Soft NEURO:  Alert & Oriented   Eulah Pont, MD [facility]   08/02/2023 1:48 PM

## 2023-08-02 NOTE — Op Note (Addendum)
[facility] Patient Name: Desiree Harrison Procedure Date: 08/02/2023 1:56 PM MRN: [id number] Endoscopist: Madelyn Brunner [place] , , [id number] Age: 77 Referring MD:  Date of Birth: 06/06/1946 Gender: Female Account #: [id number] Procedure:                Upper GI endoscopy Indications:              Dysphagia, Heartburn Medicines:                Monitored Anesthesia Care Procedure:                Pre-Anesthesia Assessment:                           - Prior to the procedure, a History and Physical                            was performed, and patient medications and                            allergies were reviewed. The patient's tolerance of                            previous anesthesia was also reviewed. The risks                            and benefits of the procedure and the sedation                            options and risks were discussed with the patient.                            All questions were answered, and informed consent                            was obtained. Prior Anticoagulants: The patient has                            taken Coumadin (warfarin), last dose was 5 days                            prior to procedure. ASA Grade Assessment: III - A                            patient with severe systemic disease. After                            reviewing the risks and benefits, the patient was                            deemed in satisfactory condition to undergo the                            procedure.  After obtaining informed consent, the endoscope was                            passed under direct vision. Throughout the                            procedure, the patient's blood pressure, pulse, and                            oxygen saturations were monitored continuously. The                            Olympus Scope SN [id number] was introduced through the                            mouth, and advanced to the second part of duodenum.                             The upper GI endoscopy was accomplished without                            difficulty. The patient tolerated the procedure                            well. Scope In: Scope Out: Findings:                 A non-bleeding Zenker's diverticulum with a small                            opening, no impacted food and no stigmata of recent                            bleeding was found.                           White nummular lesions were noted in the entire                            esophagus. Biopsies were taken with a cold forceps                            for histology.                           One benign-appearing, intrinsic moderate                            (circumferential scarring or stenosis; an endoscope                            may pass) stenosis was found at the                            gastroesophageal junction. This stenosis measured  less than one cm (in length). The stenosis was                            traversed. A TTS dilator was passed through the                            scope. Dilation with a 15-16.5-18 mm balloon                            dilator was performed to 18 mm. The dilation site                            was examined and showed mild mucosal disruption.                           A 3 cm hiatal hernia was present.                           Localized mild inflammation characterized by                            congestion (edema) and erythema was found in the                            gastric antrum. Biopsies were taken with a cold                            forceps for histology.                           The examined duodenum was normal. Complications:            No immediate complications. Estimated Blood Loss:     Estimated blood loss was minimal. Impression:               - Zenker's diverticulum.                           - White nummular lesions in esophageal mucosa.                             Biopsied.                           - Benign-appearing esophageal stenosis. Dilated.                           - 3 cm hiatal hernia.                           - Gastritis. Biopsied.                           - Normal examined duodenum. Recommendation:           - Discharge patient to home (with escort).                           -  Await pathology results.                           - Continue aspiration precautions (eat while fully                            upright, chew fully before swallowing, swallow                            fully before taking another bite)                           - Increase omeprazole to 40 mg BID.                           - Okay to restart warfarin tonight.                           - Follow up in GI clinic in 2-3 months.                           - The findings and recommendations were discussed                            with the patient. Dr Particia Lather "Alan Ripper" Leonides Schanz,  08/02/2023 2:22:57 PM

## 2023-08-02 NOTE — Progress Notes (Signed)
Sedate, gd SR, tolerated procedure well, VSS, report to RN 

## 2023-08-02 NOTE — Progress Notes (Signed)
Pt's states no medical or surgical changes since previsit or office visit. 

## 2023-08-02 NOTE — Patient Instructions (Addendum)
Handouts provided about gastritis, and hiatal hernia.  Await pathology results.  Continue aspiration precautions (eat fully upright, chew food fully before swallowing, swallow fully before taking another bite).  Increase omeprazole to 40 mg BID.  Follow up in GI clinic in 2-3 months.  Per verbal order, may start back on warfarin tonight.  YOU HAD AN ENDOSCOPIC PROCEDURE TODAY AT THE [facility]:   Refer to the procedure report that was given to you for any specific questions about what was found during the examination.  If the procedure report does not answer your questions, please call your gastroenterologist to clarify.  If you requested that your care partner not be given the details of your procedure findings, then the procedure report has been included in a sealed envelope for you to review at your convenience later.  YOU SHOULD EXPECT: Some feelings of bloating in the abdomen. Passage of more gas than usual.  Walking can help get rid of the air that was put into your GI tract during the procedure and reduce the bloating. If you had a lower endoscopy (such as a colonoscopy or flexible sigmoidoscopy) you may notice spotting of blood in your stool or on the toilet paper. If you underwent a bowel prep for your procedure, you may not have a normal bowel movement for a few days.  Please Note:  You might notice some irritation and congestion in your nose or some drainage.  This is from the oxygen used during your procedure.  There is no need for concern and it should clear up in a day or so.  SYMPTOMS TO REPORT IMMEDIATELY:  Following upper endoscopy (EGD)  Vomiting of blood or coffee ground material  New chest pain or pain under the shoulder blades  Painful or persistently difficult swallowing  New shortness of breath  Fever of 100F or higher  Black, tarry-looking stools  For urgent or emergent issues, a gastroenterologist can be reached at any hour by calling [phone]. Do not  use MyChart messaging for urgent concerns.    DIET:  We do recommend a small meal at first, but then you may proceed to your regular diet.  Drink plenty of fluids but you should avoid alcoholic beverages for 24 hours.  ACTIVITY:  You should plan to take it easy for the rest of today and you should NOT DRIVE or use heavy machinery until tomorrow (because of the sedation medicines used during the test).    FOLLOW UP: Our staff will call the number listed on your records the next business day following your procedure.  We will call around 7:15- 8:00 am to check on you and address any questions or concerns that you may have regarding the information given to you following your procedure. If we do not reach you, we will leave a message.     If any biopsies were taken you will be contacted by phone or by letter within the next 1-3 weeks.  Please call us at [phone] if you have not heard about the biopsies in 3 weeks.    SIGNATURES/CONFIDENTIALITY: You and/or your care partner have signed paperwork which will be entered into your electronic medical record.  These signatures attest to the fact that that the information above on your After Visit Summary has been reviewed and is understood.  Full responsibility of the confidentiality of this discharge information lies with you and/or your care-partner.

## 2023-08-05 ENCOUNTER — Telehealth: Payer: Self-pay | Admitting: *Deleted

## 2023-08-05 ENCOUNTER — Telehealth: Payer: Self-pay | Admitting: Cardiovascular Disease

## 2023-08-05 NOTE — Telephone Encounter (Signed)
Patient called stating she has been put on a higher dose of PRILOSEC, she wants to know if the is going it interfere with her warfarin.

## 2023-08-05 NOTE — Telephone Encounter (Signed)
I spoke to the patient and informed her that the increased dose of Prilosec will not affect Coumadin.  She verbalized understanding

## 2023-08-05 NOTE — Telephone Encounter (Signed)
  Follow up Call-     08/02/2023    1:35 PM 08/25/2021    1:52 PM  Call back number  Post procedure Call Back phone  # [phone]  Permission to leave phone message Yes Yes     Patient questions:  Do you have a fever, pain , or abdominal swelling? No. Pain Score  0 *  Have you tolerated food without any problems? Yes.    Have you been able to return to your normal activities? Yes.    Do you have any questions about your discharge instructions: Diet   No. Medications  No. Follow up visit  No.  Do you have questions or concerns about your Care? Yes.    Actions: * If pain score is 4 or above: No action needed, pain <4.

## 2023-08-07 LAB — SURGICAL PATHOLOGY

## 2023-08-09 ENCOUNTER — Encounter: Payer: Self-pay | Admitting: Internal Medicine

## 2023-08-11 ENCOUNTER — Ambulatory Visit (HOSPITAL_BASED_OUTPATIENT_CLINIC_OR_DEPARTMENT_OTHER)
Admission: RE | Admit: 2023-08-11 | Discharge: 2023-08-11 | Disposition: A | Discharge status: Home / Self Care | Payer: Medicare Other | Source: Ambulatory Visit | Attending: Pulmonary Disease | Admitting: Pulmonary Disease

## 2023-08-11 DIAGNOSIS — T17908A Unspecified foreign body in respiratory tract, part unspecified causing other injury, initial encounter: Secondary | ICD-10-CM | POA: Diagnosis present

## 2023-08-11 DIAGNOSIS — R053 Chronic cough: Secondary | ICD-10-CM | POA: Diagnosis present

## 2023-08-12 ENCOUNTER — Ambulatory Visit: Payer: Medicare Other | Attending: Cardiology

## 2023-08-12 DIAGNOSIS — I253 Aneurysm of heart: Secondary | ICD-10-CM | POA: Diagnosis not present

## 2023-08-12 DIAGNOSIS — Z7901 Long term (current) use of anticoagulants: Secondary | ICD-10-CM | POA: Diagnosis not present

## 2023-08-12 LAB — POCT INR: INR: 2.1 (ref 2.0–3.0)

## 2023-08-12 NOTE — Patient Instructions (Signed)
Continue 1 tablet by mouth daily except take 1.5 tablets Sunday, Mondays, Wednesdays, and Fridays.  Recheck INR 4 weeks [facility] [phone]

## 2023-08-20 ENCOUNTER — Telehealth: Payer: Self-pay

## 2023-08-20 NOTE — Telephone Encounter (Signed)
I tried calling radiology twice, and could not get anyone to answer. This pt had a CT done recently which has not been read yet, and I did not want to call the pt or mess with your schedule without your approval on things. Please advise.

## 2023-08-21 ENCOUNTER — Ambulatory Visit: Payer: Medicare Other | Admitting: Nurse Practitioner

## 2023-08-21 ENCOUNTER — Encounter: Payer: Self-pay | Admitting: Nurse Practitioner

## 2023-08-21 VITALS — BP 126/80 | HR 66 | Ht 67.0 in | Wt 193.2 lb

## 2023-08-21 DIAGNOSIS — K219 Gastro-esophageal reflux disease without esophagitis: Secondary | ICD-10-CM

## 2023-08-21 DIAGNOSIS — T17908D Unspecified foreign body in respiratory tract, part unspecified causing other injury, subsequent encounter: Secondary | ICD-10-CM | POA: Diagnosis not present

## 2023-08-21 DIAGNOSIS — T17908S Unspecified foreign body in respiratory tract, part unspecified causing other injury, sequela: Secondary | ICD-10-CM

## 2023-08-21 DIAGNOSIS — T17908A Unspecified foreign body in respiratory tract, part unspecified causing other injury, initial encounter: Secondary | ICD-10-CM | POA: Insufficient documentation

## 2023-08-21 DIAGNOSIS — R918 Other nonspecific abnormal finding of lung field: Secondary | ICD-10-CM | POA: Diagnosis not present

## 2023-08-21 NOTE — Patient Instructions (Signed)
Glad your swallowing is doing better after your procedure  Continue omeprazole as prescribed by Dr. Leonides Schanz  Stay upright after you eat for at least 2-3 hours. Use a wedge pillow at night to elevate your head   I will call you with your CT scan results and discuss timing of next scan. We will schedule your follow up when I call you  If symptoms do not improve or worsen, please contact office for sooner follow up or seek emergency care.

## 2023-08-21 NOTE — Assessment & Plan Note (Signed)
Scattered nodularity, improved when compared to imaging from September 2024. Felt to be related to recurrent aspiration. 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 Plan to repeat imaging in 6 months unless clinically indicated otherwise. No respiratory symptoms per her report. See above plan.

## 2023-08-21 NOTE — Assessment & Plan Note (Signed)
Recurrent aspiration. Recently underwent EGD with dilation and adjustment of PPI. Clinically improved. GERD/aspiration precautions reviewed. Follow up with GI as scheduled.   Patient Instructions  Glad your swallowing is doing better after your procedure  Continue omeprazole as prescribed by Dr. Leonides Schanz  Stay upright after you eat for at least 2-3 hours. Use a wedge pillow at night to elevate your head   I will call you with your CT scan results and discuss timing of next scan. We will schedule your follow up when I call you  If symptoms do not improve or worsen, please contact office for sooner follow up or seek emergency care.

## 2023-08-21 NOTE — Assessment & Plan Note (Signed)
With hiatal hernia. See above

## 2023-08-21 NOTE — Progress Notes (Signed)
@Patient  ID: Desiree Harrison, female    DOB: 1946/09/07, 77 y.o.   MRN: [id number]  Chief Complaint  Patient presents with   Follow-up    3 Month F/U visit.    Referring provider: Melida Quitter, MD  HPI: 77 year old female, former smoker followed for lung nodules, chronic cough.  She is a patient of Dr. Myrlene Broker and last seen in office 06/27/2023.  Past medical history significant for CHF, CAD, hypertension, CKD, GERD, insulin-dependent DM, hypothyroid, HLD  TEST/EVENTS:  05/27/2023 CT chest wo con: Atherosclerosis.  ICD in place.  Small to moderate hiatal hernia.  Groundglass opacities and numerous solid pulmonary nodules at the right upper lobe, many are new when compared to prior.  Largest new solid nodule of the right upper lobe measures 11 x 9 mm.  Stable solid nodule in the right middle lobe measuring 1.7 x 0.8 cm.  Prior cholecystectomy 08/11/2023 CT chest without contrast: Atherosclerosis.  Enlarged pulmonic trunk and heart.  Mild distal esophageal wall thickening.  Minimally improved peribronchovascular nodularity and nodular consolidation in the apical and posterior segments of the right upper lobe.  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   06/27/2023: OV with Dr. Tonia Brooms.  Admitted for significant pneumonia in 2020 with right upper lobe consolidation.  Had follow-up CT imaging in August 2024 that showed right upper lobe patchy infiltrates and a stable pulmonary nodule in the right middle lobe.  Had repeat CT imaging September 2024 that demonstrated persistent patchy infiltrates and small varying size nodules on the right upper lobe.  Reports that she occasionally chokes on food and aspirates.  Gets strangled easily.  Does take a PPI.  Never had EGD.  Changes on CT concerning for aspiration.  Will set her up with esophagram and modified barium swallow study.  Consider EGD for further evaluation.  Repeat CT in  December 2024.  08/21/2023: Today-follow-up Patient presents today for follow-up.  After her last visit, she had a swallow study that showed she was aspirating.  She then underwent EGD with Dr. Leonides Schanz with esophageal dilation. They also increased her omeprazole to 40 mg Twice daily. She tells me that she has noticed a difference in her swallowing since she had the procedure. She's not having any issues with choking now. She also feels like her reflux is better managed.  Her CT chest has not been formally read by radiology. On my interpretation, the nodularity/ground glass appears some improved. The nodule in the RML seems to be stable. She denies any issues with her breathing, cough, chest congestion. No fevers, chills, night sweats.   Allergies  Allergen Reactions   Lisinopril Cough   Metformin     Other reaction(s): nausea   Sitagliptin     Other reaction(s): nausea    Immunization History  Administered Date(s) Administered   Influenza,inj,Quad PF,6+ Mos 07/15/2018, 07/20/2019, 06/13/2020   Pneumococcal Conjugate-13 10/16/2018    Past Medical History:  Diagnosis Date   Acute systolic heart failure (HCC)    CAD in native artery    Cardiomyopathy (HCC)    Gout    Hard of hearing    Hypertension    Long term (current) use of anticoagulants    S/P internal cardiac defibrillator procedure    placed 10/28/15   STEMI (ST elevation myocardial infarction) (HCC)    Thyroid disease    Type 2 diabetes mellitus, with long-term current use of insulin (HCC)    Ventricular aneurysm  Tobacco History: Social History   Tobacco Use  Smoking Status Former   Current packs/day: 0.00   Types: Cigarettes   Quit date: 12/30/1963   Years since quitting: 59.6  Smokeless Tobacco Never  Tobacco Comments   pt smoked 5-6/cigs daily 12/30/2018   Counseling given: Not Answered Tobacco comments: pt smoked 5-6/cigs daily 12/30/2018   Outpatient Medications Prior to Visit  Medication Sig  Dispense Refill   aspirin EC 81 MG tablet Take 81 mg by mouth daily.     atorvastatin (LIPITOR) 80 MG tablet TAKE 1 TABLET BY MOUTH EVERY DAY 90 tablet 3   Blood Glucose Monitoring Suppl (ONETOUCH VERIO) w/Device KIT Use to check FSBS three times a day. Dx: E11.59, Z79.4 1 kit 0   carvedilol (COREG) 25 MG tablet Take 1 tablet (25 mg total) by mouth 2 (two) times daily with a meal. 180 tablet 1   empagliflozin (JARDIANCE) 10 MG TABS tablet 1 tablet     gabapentin (NEURONTIN) 100 MG capsule Take 2 capsules (200 mg total) by mouth 2 (two) times daily. 120 capsule 5   gabapentin (NEURONTIN) 300 MG capsule Take by mouth.     glucose blood (ONETOUCH VERIO) test strip Use to check FSBS three times a day. Dx: E11.59, Z79.4 100 each 2   HUMALOG MIX 50/50 KWIKPEN (50-50) 100 UNIT/ML Kwikpen Inject 26 Units into the skin 2 (two) times daily. Dx: E11.59, Z79.4 15 mL 5   Insulin Pen Needle (BD PEN NEEDLE MICRO U/F) 32G X 6 MM MISC Use twice a day with Humalog Mix Kwikpen. Dx: E11.59, Z79.4 100 each 5   Lancet Devices (ONE TOUCH DELICA LANCING DEV) MISC Use to check FSBS three times a day. Dx: E11.59, Z79.4 1 each 0   levothyroxine (SYNTHROID) 50 MCG tablet TAKE 1 TABLET BY MOUTH EVERY DAY 30 tablet 1   omeprazole (PRILOSEC) 40 MG capsule Take 1 capsule (40 mg total) by mouth 2 (two) times daily. 90 capsule 3   sacubitril-valsartan (ENTRESTO) 49-51 MG Take 1 tablet by mouth 2 (two) times daily. 180 tablet 3   Semaglutide-Weight Management 1 MG/0.5ML SOAJ Inject 1 mg into the skin.     spironolactone (ALDACTONE) 25 MG tablet TAKE 1/2 TABLET BY MOUTH EVERY DAY 45 tablet 3   warfarin (COUMADIN) 3 MG tablet TAKE 1 TO 1&1/2 (1-1.5) TABLETS BY MOUTH DAILY OR AS DIRECTED BY COUMADIN CLINIC 135 tablet 3   No facility-administered medications prior to visit.     Review of Systems:   Constitutional: No weight loss or gain, night sweats, fevers, chills, fatigue, or lassitude. HEENT: No headaches, tooth/dental  problems, or sore throat. No sneezing, itching, ear ache, nasal congestion, or post nasal drip. +difficulty swallowing (improved) CV:  No chest pain, orthopnea, PND, swelling in lower extremities, anasarca, dizziness, palpitations, syncope Resp: No shortness of breath with exertion or at rest. No excess mucus or change in color of mucus. No productive or non-productive. No hemoptysis. No wheezing.  No chest wall deformity GI:  +improved heartburn, indigestion. No abdominal pain, nausea, vomiting, diarrhea, change in bowel habits, loss of appetite, bloody stools.  GU: No dysuria, change in color of urine, urgency or frequency.   Skin: No rash, lesions, ulcerations MSK:  No joint pain or swelling.  Neuro: No dizziness or lightheadedness.  Psych: No depression or anxiety. Mood stable.     Physical Exam:  BP 126/80 (BP Location: Right Arm, Cuff Size: Normal)   Pulse 66   Ht 5\' 7"  (1.702  m)   Wt 193 lb 3.2 oz (87.6 kg)   SpO2 95%   BMI 30.26 kg/m   GEN: Pleasant, interactive, well-appearing; obese; in no acute distress HEENT:  Normocephalic and atraumatic. PERRLA. Sclera white. Nasal turbinates pink, moist and patent bilaterally. No rhinorrhea present. Oropharynx pink and moist, without exudate or edema. No lesions, ulcerations, or postnasal drip.  NECK:  Supple w/ fair ROM. No JVD present. Normal carotid impulses w/o bruits. Thyroid symmetrical with no goiter or nodules palpated. No lymphadenopathy.   CV: RRR, no m/r/g, no peripheral edema. Pulses intact, +2 bilaterally. No cyanosis, pallor or clubbing. PULMONARY:  Unlabored, regular breathing. Clear bilaterally A&P w/o wheezes/rales/rhonchi. No accessory muscle use.  GI: BS present and normoactive. Soft, non-tender to palpation. No organomegaly or masses detected.  MSK: No erythema, warmth or tenderness. Cap refil <2 sec all extrem. No deformities or joint swelling noted.  Neuro: A/Ox3. No focal deficits noted.   Skin: Warm, no lesions or  rashe Psych: Normal affect and behavior. Judgement and thought content appropriate.     Lab Results:  CBC    Component Value Date/Time   WBC 6.2 04/13/2023 1446   RBC 5.68 (H) 04/13/2023 1446   HGB 15.2 (H) 04/13/2023 1446   HGB 14.3 06/13/2020 1004   HCT 49.2 (H) 04/13/2023 1446   HCT 43.7 06/13/2020 1004   PLT 196 04/13/2023 1446   PLT 214 06/13/2020 1004   MCV 86.6 04/13/2023 1446   MCV 81 06/13/2020 1004   MCH 26.8 04/13/2023 1446   MCHC 30.9 04/13/2023 1446   RDW 15.5 04/13/2023 1446   RDW 14.5 06/13/2020 1004   LYMPHSABS 1.9 04/13/2023 1446   LYMPHSABS 2.8 06/13/2020 1004   MONOABS 0.6 04/13/2023 1446   EOSABS 0.1 04/13/2023 1446   EOSABS 0.1 06/13/2020 1004   BASOSABS 0.0 04/13/2023 1446   BASOSABS 0.0 06/13/2020 1004    BMET    Component Value Date/Time   NA 143 04/13/2023 1446   NA 144 05/25/2021 1158   K 4.5 04/13/2023 1446   CL 109 04/13/2023 1446   CO2 26 04/13/2023 1446   GLUCOSE 78 04/13/2023 1446   BUN 18 04/13/2023 1446   BUN 14 05/25/2021 1158   CREATININE 1.37 (H) 04/13/2023 1446   CREATININE 1.10 (H) 12/30/2018 1543   CALCIUM 9.0 04/13/2023 1446   GFRNONAA 40 (L) 04/13/2023 1446   GFRAA 60 06/13/2020 1004    BNP No results found for: "BNP"   Imaging:  CT Chest Wo Contrast  Result Date: 08/21/2023 CLINICAL DATA:  Lung nodule. EXAM: CT CHEST WITHOUT CONTRAST TECHNIQUE: Multidetector CT imaging of the chest was performed following the standard protocol without IV contrast. RADIATION DOSE REDUCTION: This exam was performed according to the departmental dose-optimization program which includes automated exposure control, adjustment of the Desiree and/or kV according to patient size and/or use of iterative reconstruction technique. COMPARISON:  05/27/2023, 04/13/2023, 01/17/2019. FINDINGS: Cardiovascular: Atherosclerotic calcification of the aorta, aortic valve and coronary arteries. Enlarged pulmonic trunk and heart. No pericardial effusion.  Mediastinum/Nodes: No pathologically enlarged mediastinal or axillary lymph nodes. Hilar regions are difficult to definitively evaluate without IV contrast. Mild distal esophageal wall thickening can be seen in the setting of gastroesophageal reflux disease. Lungs/Pleura: Minimally improved peribronchovascular nodularity and nodular consolidation in the apical and posterior segments of the right upper lobe. 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 No pleural fluid. Airway is unremarkable. Upper Abdomen: Cholecystectomy.  Small hiatal hernia. Visualized portions of the liver, right adrenal gland, right kidney, spleen, pancreas, stomach and bowel are otherwise grossly unremarkable. No upper abdominal adenopathy. Musculoskeletal: Degenerative changes in the spine. IMPRESSION: 1. Slight interval improvement in peribronchovascular nodularity, ground-glass and nodular consolidation in the posterior segment right upper lobe, likely due to a combination of postinfectious scarring and repeat/chronic atypical infection. 2. 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 3. Aortic atherosclerosis (ICD10-I70.0). Coronary artery calcification. 4. Enlarged pulmonic trunk, indicative of pulmonary arterial hypertension. Electronically Signed   By: Leanna Battles M.D.   On: 08/21/2023 11:15    Administration History     None           No data to display          No results found for: "NITRICOXIDE"      Assessment & Plan:   Aspiration into respiratory tract Recurrent aspiration. Recently underwent EGD with dilation and adjustment of PPI. Clinically improved. GERD/aspiration precautions reviewed. Follow up with GI as scheduled.   Patient Instructions  Glad your swallowing is doing better after your procedure  Continue omeprazole as prescribed by Dr.  Leonides Schanz  Stay upright after you eat for at least 2-3 hours. Use a wedge pillow at night to elevate your head   I will call you with your CT scan results and discuss timing of next scan. We will schedule your follow up when I call you  If symptoms do not improve or worsen, please contact office for sooner follow up or seek emergency care.    Gastroesophageal reflux disease With hiatal hernia. See above  Pulmonary nodules Scattered nodularity, improved when compared to imaging from September 2024. Felt to be related to recurrent aspiration. 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 Plan to repeat imaging in 6 months unless clinically indicated otherwise. No respiratory symptoms per her report. See above plan.    Advised if symptoms do not improve or worsen, to please contact office for sooner follow up or seek emergency care.   I spent 32 minutes of dedicated to the care of this patient on the date of this encounter to include pre-visit review of records, face-to-face time with the patient discussing conditions above, post visit ordering of testing, clinical documentation with the electronic health record, making appropriate referrals as documented, and communicating necessary findings to members of the patients care team.  Noemi Chapel, NP 08/21/2023  Pt aware and understands NP's role.

## 2023-08-22 NOTE — Progress Notes (Signed)
Post inflammatory area improved. No follow up ct imaging needed.   Thanks,  BLI  Josephine Igo, DO [facility] 08/22/2023 11:47 AM

## 2023-08-29 ENCOUNTER — Ambulatory Visit (INDEPENDENT_AMBULATORY_CARE_PROVIDER_SITE_OTHER): Payer: Medicare Other

## 2023-08-29 DIAGNOSIS — I253 Aneurysm of heart: Secondary | ICD-10-CM | POA: Diagnosis not present

## 2023-08-29 LAB — CUP PACEART REMOTE DEVICE CHECK
Battery Remaining Longevity: 114 mo
Battery Remaining Percentage: 100 %
Brady Statistic RV Percent Paced: 0 %
Date Time Interrogation Session: 20241219021100
HighPow Impedance: 66 Ohm
Implantable Pulse Generator Implant Date: 20170217
Lead Channel Impedance Value: 814 Ohm
Lead Channel Pacing Threshold Amplitude: 0.9 V
Lead Channel Pacing Threshold Pulse Width: 0.4 ms
Lead Channel Setting Pacing Amplitude: 2 V
Lead Channel Setting Pacing Pulse Width: 0.4 ms
Lead Channel Setting Sensing Sensitivity: 0.5 mV
Pulse Gen Serial Number: 213123
Zone Setting Status: 755011

## 2023-09-09 ENCOUNTER — Ambulatory Visit: Payer: Medicare Other | Attending: Cardiovascular Disease

## 2023-09-09 DIAGNOSIS — I253 Aneurysm of heart: Secondary | ICD-10-CM

## 2023-09-09 DIAGNOSIS — Z7901 Long term (current) use of anticoagulants: Secondary | ICD-10-CM | POA: Diagnosis not present

## 2023-09-09 LAB — POCT INR: INR: 2.2 (ref 2.0–3.0)

## 2023-09-09 NOTE — Patient Instructions (Signed)
Description   Continue 1 tablet by mouth daily except take 1.5 tablets Sundays, Mondays, Wednesdays, and Fridays.  Recheck INR 4 weeks [facility] [phone]

## 2023-10-01 NOTE — Progress Notes (Signed)
Remote ICD transmission.   

## 2023-10-07 ENCOUNTER — Ambulatory Visit: Payer: Medicare Other | Attending: Cardiovascular Disease

## 2023-10-07 DIAGNOSIS — I253 Aneurysm of heart: Secondary | ICD-10-CM

## 2023-10-07 DIAGNOSIS — Z7901 Long term (current) use of anticoagulants: Secondary | ICD-10-CM

## 2023-10-07 DIAGNOSIS — Z5181 Encounter for therapeutic drug level monitoring: Secondary | ICD-10-CM

## 2023-10-07 LAB — POCT INR: INR: 2.9 (ref 2.0–3.0)

## 2023-10-07 NOTE — Patient Instructions (Signed)
Continue 1 tablet by mouth daily except take 1.5 tablets Sundays, Mondays, Wednesdays, and Fridays.  Recheck INR 6 weeks [facility] [phone]

## 2023-11-18 ENCOUNTER — Ambulatory Visit: Payer: Medicare Other | Attending: Cardiovascular Disease

## 2023-11-18 DIAGNOSIS — I253 Aneurysm of heart: Secondary | ICD-10-CM | POA: Diagnosis not present

## 2023-11-18 DIAGNOSIS — Z7901 Long term (current) use of anticoagulants: Secondary | ICD-10-CM | POA: Diagnosis not present

## 2023-11-18 LAB — POCT INR: INR: 2.3 (ref 2.0–3.0)

## 2023-11-18 NOTE — Patient Instructions (Signed)
 Continue 1 tablet by mouth daily except take 1.5 tablets Sundays, Mondays, Wednesdays, and Fridays.  Recheck INR 6 weeks [facility] [phone]

## 2023-11-28 ENCOUNTER — Ambulatory Visit: Payer: Medicare Other

## 2023-11-28 DIAGNOSIS — I253 Aneurysm of heart: Secondary | ICD-10-CM

## 2023-11-29 LAB — CUP PACEART REMOTE DEVICE CHECK
Battery Remaining Longevity: 108 mo
Battery Remaining Percentage: 96 %
Brady Statistic RV Percent Paced: 0 %
Date Time Interrogation Session: 20250320021100
HighPow Impedance: 67 Ohm
Implantable Pulse Generator Implant Date: 20170217
Lead Channel Impedance Value: 832 Ohm
Lead Channel Pacing Threshold Amplitude: 0.9 V
Lead Channel Pacing Threshold Pulse Width: 0.4 ms
Lead Channel Setting Pacing Amplitude: 2 V
Lead Channel Setting Pacing Pulse Width: 0.4 ms
Lead Channel Setting Sensing Sensitivity: 0.5 mV
Pulse Gen Serial Number: 213123
Zone Setting Status: 755011

## 2023-12-02 ENCOUNTER — Encounter: Payer: Self-pay | Admitting: Cardiovascular Disease

## 2023-12-03 ENCOUNTER — Encounter: Payer: Self-pay | Admitting: Student-PharmD

## 2023-12-03 NOTE — Progress Notes (Signed)
 Patient has been approved for [employer] assistance through [employer] through the remainder of 2025. Pt reports she is doing well on Ozempic 2mg  weekly, A1C today during PCP appt is 6.5.  Jeanella Craze, PharmD Clinical Pharmacist [phone]

## 2023-12-30 ENCOUNTER — Ambulatory Visit: Attending: Cardiovascular Disease

## 2023-12-30 DIAGNOSIS — Z7901 Long term (current) use of anticoagulants: Secondary | ICD-10-CM

## 2023-12-30 DIAGNOSIS — I253 Aneurysm of heart: Secondary | ICD-10-CM

## 2023-12-30 LAB — POCT INR: INR: 2 (ref 2.0–3.0)

## 2023-12-30 NOTE — Patient Instructions (Signed)
 Continue 1 tablet by mouth daily except take 1.5 tablets Sundays, Mondays, Wednesdays, and Fridays.  Recheck INR 6 weeks [facility] [phone]

## 2024-01-08 NOTE — Progress Notes (Signed)
 Remote ICD transmission.

## 2024-01-08 NOTE — Addendum Note (Signed)
 Addended by: Lott Rouleau A on: 01/08/2024 10:09 AM   Modules accepted: Orders

## 2024-02-04 ENCOUNTER — Other Ambulatory Visit: Payer: Self-pay | Admitting: Internal Medicine

## 2024-02-10 ENCOUNTER — Ambulatory Visit: Attending: Cardiovascular Disease

## 2024-02-10 DIAGNOSIS — I253 Aneurysm of heart: Secondary | ICD-10-CM

## 2024-02-10 DIAGNOSIS — Z7901 Long term (current) use of anticoagulants: Secondary | ICD-10-CM | POA: Diagnosis not present

## 2024-02-10 LAB — POCT INR: INR: 3.2 — AB (ref 2.0–3.0)

## 2024-02-10 NOTE — Patient Instructions (Signed)
 Continue 1 tablet daily except take 1.5 tablets Sundays, Mondays, Wednesdays, and Fridays. Greens tonight Recheck INR 6 weeks Coumadin  Clinic [phone]

## 2024-02-19 ENCOUNTER — Ambulatory Visit
Admission: RE | Admit: 2024-02-19 | Discharge: 2024-02-19 | Disposition: A | Discharge status: Home / Self Care | Source: Ambulatory Visit | Attending: Nurse Practitioner | Admitting: Nurse Practitioner

## 2024-02-19 DIAGNOSIS — R918 Other nonspecific abnormal finding of lung field: Secondary | ICD-10-CM

## 2024-02-27 ENCOUNTER — Ambulatory Visit (INDEPENDENT_AMBULATORY_CARE_PROVIDER_SITE_OTHER): Payer: Medicare Other

## 2024-02-27 DIAGNOSIS — I5042 Chronic combined systolic (congestive) and diastolic (congestive) heart failure: Secondary | ICD-10-CM

## 2024-03-03 ENCOUNTER — Ambulatory Visit: Payer: Self-pay | Admitting: Nurse Practitioner

## 2024-03-03 LAB — CUP PACEART REMOTE DEVICE CHECK
Battery Remaining Longevity: 108 mo
Battery Remaining Percentage: 97 %
Brady Statistic RV Percent Paced: 0 %
Date Time Interrogation Session: 20250621192800
HighPow Impedance: 74 Ohm
Implantable Pulse Generator Implant Date: 20170217
Lead Channel Impedance Value: 893 Ohm
Lead Channel Pacing Threshold Amplitude: 0.9 V
Lead Channel Pacing Threshold Pulse Width: 0.4 ms
Lead Channel Setting Pacing Amplitude: 2 V
Lead Channel Setting Pacing Pulse Width: 0.4 ms
Lead Channel Setting Sensing Sensitivity: 0.5 mV
Pulse Gen Serial Number: 213123
Zone Setting Status: 755011

## 2024-03-03 NOTE — Progress Notes (Signed)
 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 Also mention of ABPA. She has a stable nodule in her endobronchial area, unclear what this is but no significant change from 2020, which is good. 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 Please schedule her with Dr. Kara 6/26 at 4 pm in held slot if ok with him, to review CT/next steps and establish care. (Former [facility] pt) Thanks!

## 2024-03-03 NOTE — Progress Notes (Signed)
 I called pt and there was no answer- LMTCB. I went ahead and scheduled ov with JD.

## 2024-03-05 ENCOUNTER — Ambulatory Visit: Admitting: Pulmonary Disease

## 2024-03-05 ENCOUNTER — Encounter: Payer: Self-pay | Admitting: Pulmonary Disease

## 2024-03-05 VITALS — BP 125/79 | HR 80 | Ht 67.0 in | Wt 187.0 lb

## 2024-03-05 DIAGNOSIS — Z87891 Personal history of nicotine dependence: Secondary | ICD-10-CM

## 2024-03-05 DIAGNOSIS — J479 Bronchiectasis, uncomplicated: Secondary | ICD-10-CM | POA: Diagnosis not present

## 2024-03-05 DIAGNOSIS — T17908S Unspecified foreign body in respiratory tract, part unspecified causing other injury, sequela: Secondary | ICD-10-CM

## 2024-03-05 DIAGNOSIS — K219 Gastro-esophageal reflux disease without esophagitis: Secondary | ICD-10-CM

## 2024-03-05 NOTE — Patient Instructions (Addendum)
 Your CT Chest scan shows on going mucous and inflammation in the right upper lung. We will repeat a CT Chest scan in 1 year to monitor these findings.  If you develop worsening cough, mucous production, wheezing or shortness of breath please let us  know  We will check pulmonary function tests at follow up visit in 6 months

## 2024-03-05 NOTE — Progress Notes (Signed)
 Synopsis: Hx of Bronchiectasis  Subjective:   PATIENT ID: Desiree Harrison GENDER: female DOB: 1946/08/08, MRN: [id number]   HPI  Chief Complaint  Patient presents with   Follow-up   Desiree Harrison is a 78 year old woman, former smoker with history of CAD, CHF, hypertension, GERD and DMII who returns to pulmonary clinic for follow up of bronchiectasis.   CT scans from August, December, and earlier this month show stable lung nodules, particularly in the right upper lung. She experiences no significant respiratory symptoms such as cough, mucus production, or shortness of breath. Her breathing problems have improved following treatment for swallowing and esophageal issues. She engages in daily physical activity by walking her dog and has no significant exposure to dust or chemicals. Her weight has decreased by about 60 pounds due to the use of Ozempic, and she feels better as a result. She has not smoked in the past 50 years.  Past Medical History:  Diagnosis Date   Acute systolic heart failure (HCC)    CAD in native artery    Cardiomyopathy (HCC)    Gout    Hard of hearing    Hypertension    Long term (current) use of anticoagulants    S/P internal cardiac defibrillator procedure    placed 10/28/15   STEMI (ST elevation myocardial infarction) (HCC)    Thyroid  disease    Type 2 diabetes mellitus, with long-term current use of insulin  (HCC)    Ventricular aneurysm      Family History  Problem Relation Age of Onset   Breast cancer Mother    Coronary artery disease Mother    Diabetes Mother    Stroke Father    Colon cancer Neg Hx    Rectal cancer Neg Hx    Stomach cancer Neg Hx      Social History   Socioeconomic History   Marital status: Widowed    Spouse name: Not on file   Number of children: Not on file   Years of education: Not on file   Highest education level: Not on file  Occupational History   Not on file  Tobacco Use   Smoking status: Former    Current  packs/day: 0.00    Types: Cigarettes    Quit date: 12/30/1963    Years since quitting: 60.2   Smokeless tobacco: Never   Tobacco comments:    pt smoked 5-6/cigs daily 12/30/2018  Vaping Use   Vaping status: Never Used  Substance and Sexual Activity   Alcohol use: Not Currently   Drug use: Never   Sexual activity: Not Currently    Birth control/protection: Surgical, Post-menopausal  Other Topics Concern   Not on file  Social History Narrative   Not on file   Social Drivers of Health   Financial Resource Strain: Not on file  Food Insecurity: Not on file  Transportation Needs: Not on file  Physical Activity: Not on file  Stress: Not on file  Social Connections: Not on file  Intimate Partner Violence: Not on file     Allergies  Allergen Reactions   Lisinopril Cough   Metformin     Other reaction(s): nausea   Sitagliptin     Other reaction(s): nausea     Outpatient Medications Prior to Visit  Medication Sig Dispense Refill   aspirin  EC 81 MG tablet Take 81 mg by mouth daily.     atorvastatin  (LIPITOR ) 80 MG tablet TAKE 1 TABLET BY MOUTH EVERY DAY 90 tablet 3  Blood Glucose Monitoring Suppl (ONETOUCH VERIO) w/Device KIT Use to check FSBS three times a day. Dx: E11.59, Z79.4 1 kit 0   carvedilol  (COREG ) 25 MG tablet Take 1 tablet (25 mg total) by mouth 2 (two) times daily with a meal. 180 tablet 1   empagliflozin (JARDIANCE) 10 MG TABS tablet 1 tablet     gabapentin  (NEURONTIN ) 100 MG capsule Take 2 capsules (200 mg total) by mouth 2 (two) times daily. 120 capsule 5   gabapentin  (NEURONTIN ) 300 MG capsule Take by mouth.     glucose blood (ONETOUCH VERIO) test strip Use to check FSBS three times a day. Dx: E11.59, Z79.4 100 each 2   HUMALOG  MIX 50/50 KWIKPEN (50-50) 100 UNIT/ML Kwikpen Inject 26 Units into the skin 2 (two) times daily. Dx: E11.59, Z79.4 15 mL 5   Insulin  Pen Needle (BD PEN NEEDLE MICRO U/F) 32G X 6 MM MISC Use twice a day with Humalog  Mix Kwikpen. Dx:  E11.59, Z79.4 100 each 5   Lancet Devices (ONE TOUCH DELICA LANCING DEV) MISC Use to check FSBS three times a day. Dx: E11.59, Z79.4 1 each 0   levothyroxine  (SYNTHROID ) 50 MCG tablet TAKE 1 TABLET BY MOUTH EVERY DAY 30 tablet 1   omeprazole  (PRILOSEC) 40 MG capsule TAKE 1 CAPSULE BY MOUTH TWICE A DAY 90 capsule 3   sacubitril -valsartan  (ENTRESTO ) 49-51 MG Take 1 tablet by mouth 2 (two) times daily. 180 tablet 3   Semaglutide-Weight Management 1 MG/0.5ML SOAJ Inject 1 mg into the skin.     spironolactone  (ALDACTONE ) 25 MG tablet TAKE 1/2 TABLET BY MOUTH EVERY DAY 45 tablet 3   warfarin (COUMADIN ) 3 MG tablet TAKE 1 TO 1&1/2 (1-1.5) TABLETS BY MOUTH DAILY OR AS DIRECTED BY COUMADIN  CLINIC 135 tablet 3   No facility-administered medications prior to visit.    Review of Systems  Constitutional:  Negative for chills, fever, malaise/fatigue and weight loss.  HENT:  Negative for congestion, sinus pain and sore throat.   Eyes: Negative.   Respiratory:  Negative for cough, hemoptysis, sputum production, shortness of breath and wheezing.   Cardiovascular:  Negative for chest pain, palpitations, orthopnea, claudication and leg swelling.  Gastrointestinal:  Negative for abdominal pain, heartburn, nausea and vomiting.  Genitourinary: Negative.   Musculoskeletal:  Negative for joint pain and myalgias.  Skin:  Negative for rash.  Neurological:  Negative for weakness.  Endo/Heme/Allergies: Negative.   Psychiatric/Behavioral: Negative.     Objective:   Vitals:   03/05/24 1558  BP: 125/79  Pulse: 80  SpO2: 95%  Weight: 187 lb (84.8 kg)  Height: 5' 7 (1.702 m)   Physical Exam Constitutional:      General: She is not in acute distress.    Appearance: Normal appearance.   Eyes:     General: No scleral icterus.    Conjunctiva/sclera: Conjunctivae normal.    Cardiovascular:     Rate and Rhythm: Normal rate and regular rhythm.  Pulmonary:     Breath sounds: No wheezing, rhonchi or rales.    Musculoskeletal:     Right lower leg: No edema.     Left lower leg: No edema.   Skin:    General: Skin is warm and dry.   Neurological:     General: No focal deficit present.    CBC    Component Value Date/Time   WBC 6.2 04/13/2023 1446   RBC 5.68 (H) 04/13/2023 1446   HGB 15.2 (H) 04/13/2023 1446   HGB 14.3 06/13/2020  1004   HCT 49.2 (H) 04/13/2023 1446   HCT 43.7 06/13/2020 1004   PLT 196 04/13/2023 1446   PLT 214 06/13/2020 1004   MCV 86.6 04/13/2023 1446   MCV 81 06/13/2020 1004   MCH 26.8 04/13/2023 1446   MCHC 30.9 04/13/2023 1446   RDW 15.5 04/13/2023 1446   RDW 14.5 06/13/2020 1004   LYMPHSABS 1.9 04/13/2023 1446   LYMPHSABS 2.8 06/13/2020 1004   MONOABS 0.6 04/13/2023 1446   EOSABS 0.1 04/13/2023 1446   EOSABS 0.1 06/13/2020 1004   BASOSABS 0.0 04/13/2023 1446   BASOSABS 0.0 06/13/2020 1004      Latest Ref Rng & Units 04/13/2023    2:46 PM 05/25/2021   11:58 AM 05/10/2021    3:32 PM  BMP  Glucose 70 - 99 mg/dL 78  75  79   BUN 8 - 23 mg/dL 18  14  14    Creatinine 0.44 - 1.00 mg/dL 8.62  8.80  8.73   BUN/Creat Ratio 12 - 28  12  11    Sodium 135 - 145 mmol/L 143  144  143   Potassium 3.5 - 5.1 mmol/L 4.5  4.7  4.5   Chloride 98 - 111 mmol/L 109  109  107   CO2 22 - 32 mmol/L 26  23  23    Calcium  8.9 - 10.3 mg/dL 9.0  9.1  9.2    Chest imaging: CT Chest 02/19/24 IMPRESSION: 1. Progressive airway impaction, bronchiectasis, and peripheral tree-in-bud nodularity and consolidation within the posterior segment of the right upper lobe. The findings may relate to changes of recurrent infectious bronchiolitis or a superimposed inflammatory process such as allergic bronchopulmonary aspergillosis (ABPA). 2. Stable ovoid endobronchial nodule within the right middle lobe measuring 8 x 16 mm which may reflect a chronic mucocele or a endobronchial mass such as a bronchial carcinoid given its stability since remote prior examination 01/17/2019. 3. Extensive  multi-vessel coronary artery calcification. 4. Mural calcification within the left ventricular apex and adjacent pericardial calcifications in keeping with prior myocardial infarction in this location. 5. Small hiatal hernia.   PFT:     No data to display          Labs:  Path:  Echo:  Heart Catheterization:    Assessment & Plan:   Bronchiectasis without complication (HCC) - Plan: Pulmonary Function Test  Aspiration into respiratory tract, sequela  Gastroesophageal reflux disease, unspecified whether esophagitis present  Discussion: Desiree Harrison is a 78 year old woman, former smoker with history of CAD, CHF, hypertension, GERD and DMII who returns to pulmonary clinic for follow up of bronchiectasis.  Bronchiectasis Right Upper Lobe Ground Glass Opacities CT Chest shows on going abnormal CT Chest findings which are stable from year before. - continue CT monitoring at this time - patient to let us  know if she has any increase in symptoms such as coughing, wheezing or mucous production. - Follow up in 6 months with PFTs  Dorn Chill, MD [facility] Office: [phone]    Current Outpatient Medications:    aspirin  EC 81 MG tablet, Take 81 mg by mouth daily., Disp: , Rfl:    atorvastatin  (LIPITOR ) 80 MG tablet, TAKE 1 TABLET BY MOUTH EVERY DAY, Disp: 90 tablet, Rfl: 3   Blood Glucose Monitoring Suppl (ONETOUCH VERIO) w/Device KIT, Use to check FSBS three times a day. Dx: E11.59, Z79.4, Disp: 1 kit, Rfl: 0   carvedilol  (COREG ) 25 MG tablet, Take 1 tablet (25 mg total)  by mouth 2 (two) times daily with a meal., Disp: 180 tablet, Rfl: 1   empagliflozin (JARDIANCE) 10 MG TABS tablet, 1 tablet, Disp: , Rfl:    gabapentin  (NEURONTIN ) 100 MG capsule, Take 2 capsules (200 mg total) by mouth 2 (two) times daily., Disp: 120 capsule, Rfl: 5   gabapentin  (NEURONTIN ) 300 MG capsule, Take by mouth., Disp: , Rfl:    glucose blood (ONETOUCH VERIO) test  strip, Use to check FSBS three times a day. Dx: E11.59, Z79.4, Disp: 100 each, Rfl: 2   HUMALOG  MIX 50/50 KWIKPEN (50-50) 100 UNIT/ML Kwikpen, Inject 26 Units into the skin 2 (two) times daily. Dx: E11.59, Z79.4, Disp: 15 mL, Rfl: 5   Insulin  Pen Needle (BD PEN NEEDLE MICRO U/F) 32G X 6 MM MISC, Use twice a day with Humalog  Mix Kwikpen. Dx: E11.59, Z79.4, Disp: 100 each, Rfl: 5   Lancet Devices (ONE TOUCH DELICA LANCING DEV) MISC, Use to check FSBS three times a day. Dx: E11.59, Z79.4, Disp: 1 each, Rfl: 0   levothyroxine  (SYNTHROID ) 50 MCG tablet, TAKE 1 TABLET BY MOUTH EVERY DAY, Disp: 30 tablet, Rfl: 1   omeprazole  (PRILOSEC) 40 MG capsule, TAKE 1 CAPSULE BY MOUTH TWICE A DAY, Disp: 90 capsule, Rfl: 3   sacubitril -valsartan  (ENTRESTO ) 49-51 MG, Take 1 tablet by mouth 2 (two) times daily., Disp: 180 tablet, Rfl: 3   Semaglutide-Weight Management 1 MG/0.5ML SOAJ, Inject 1 mg into the skin., Disp: , Rfl:    spironolactone  (ALDACTONE ) 25 MG tablet, TAKE 1/2 TABLET BY MOUTH EVERY DAY, Disp: 45 tablet, Rfl: 3   warfarin (COUMADIN ) 3 MG tablet, TAKE 1 TO 1&1/2 (1-1.5) TABLETS BY MOUTH DAILY OR AS DIRECTED BY COUMADIN  CLINIC, Disp: 135 tablet, Rfl: 3

## 2024-03-06 ENCOUNTER — Encounter: Payer: Self-pay | Admitting: Pulmonary Disease

## 2024-03-09 ENCOUNTER — Ambulatory Visit: Payer: Self-pay | Admitting: Cardiovascular Disease

## 2024-03-23 ENCOUNTER — Ambulatory Visit: Attending: Cardiovascular Disease

## 2024-03-23 DIAGNOSIS — I253 Aneurysm of heart: Secondary | ICD-10-CM | POA: Diagnosis not present

## 2024-03-23 DIAGNOSIS — Z7901 Long term (current) use of anticoagulants: Secondary | ICD-10-CM

## 2024-03-23 LAB — POCT INR: INR: 3.6 — AB (ref 2.0–3.0)

## 2024-03-23 NOTE — Patient Instructions (Signed)
 Hold tonight only then Continue 1 tablet daily except take 1.5 tablets Sundays, Mondays, Wednesdays, and Fridays.  Recheck INR 3 weeks Coumadin  Clinic [phone]

## 2024-03-23 NOTE — Progress Notes (Signed)
Please see anticoagulation encounter.

## 2024-04-13 ENCOUNTER — Ambulatory Visit: Attending: Cardiovascular Disease | Admitting: *Deleted

## 2024-04-13 DIAGNOSIS — I253 Aneurysm of heart: Secondary | ICD-10-CM | POA: Diagnosis not present

## 2024-04-13 DIAGNOSIS — Z7901 Long term (current) use of anticoagulants: Secondary | ICD-10-CM

## 2024-04-13 LAB — POCT INR: INR: 3 (ref 2.0–3.0)

## 2024-04-13 NOTE — Patient Instructions (Signed)
 Description   Continue taking 1.5 tablets daily except take 1 tablet Tuesday, Thursday, and Saturday. Recheck INR 4 weeks. Coumadin  Clinic [phone]

## 2024-04-13 NOTE — Progress Notes (Signed)
 INR 3.0; Please see anticoagulation encounter

## 2024-04-14 ENCOUNTER — Other Ambulatory Visit: Payer: Self-pay | Admitting: Cardiovascular Disease

## 2024-04-29 NOTE — Addendum Note (Signed)
 Addended by: VICCI SELLER A on: 04/29/2024 11:07 AM   Modules accepted: Orders

## 2024-04-29 NOTE — Progress Notes (Signed)
 Remote ICD transmission.

## 2024-05-13 ENCOUNTER — Ambulatory Visit

## 2024-05-14 ENCOUNTER — Other Ambulatory Visit: Payer: Self-pay | Admitting: Cardiovascular Disease

## 2024-05-14 DIAGNOSIS — I5042 Chronic combined systolic (congestive) and diastolic (congestive) heart failure: Secondary | ICD-10-CM

## 2024-05-20 ENCOUNTER — Ambulatory Visit: Attending: Cardiovascular Disease | Admitting: Pharmacist

## 2024-05-20 DIAGNOSIS — I253 Aneurysm of heart: Secondary | ICD-10-CM | POA: Diagnosis not present

## 2024-05-20 DIAGNOSIS — Z7901 Long term (current) use of anticoagulants: Secondary | ICD-10-CM | POA: Diagnosis not present

## 2024-05-20 LAB — POCT INR: INR: 4.3 — AB (ref 2.0–3.0)

## 2024-05-20 NOTE — Progress Notes (Signed)
 INR 4.3; Please see anticoagulation encounter   Description   Hold dose today and take 1/2 tablet tomorrow.  Then continue taking 1.5 tablets daily except take 1 tablet Tuesday, Thursday, and Saturday. Recheck INR 2 weeks. Coumadin  Clinic [phone]

## 2024-05-20 NOTE — Patient Instructions (Signed)
 Description   Hold dose today and take 1/2 tablet tomorrow.  Then continue taking 1.5 tablets daily except take 1 tablet Tuesday, Thursday, and Saturday. Recheck INR 2 weeks. Coumadin  Clinic [phone]

## 2024-05-27 ENCOUNTER — Other Ambulatory Visit: Payer: Self-pay | Admitting: Cardiovascular Disease

## 2024-05-27 DIAGNOSIS — Z5181 Encounter for therapeutic drug level monitoring: Secondary | ICD-10-CM

## 2024-05-28 ENCOUNTER — Telehealth: Payer: Self-pay

## 2024-05-28 NOTE — Telephone Encounter (Signed)
 Patient started Doxycycline  9/18 so I recommended reducing Warfarin until we see her 9/22, 0.5 tablet 9/18, 9/20 and only 1 tablet 9/19 and 9/21.  She verbalized understanding.

## 2024-06-01 ENCOUNTER — Ambulatory Visit: Attending: Cardiovascular Disease

## 2024-06-01 DIAGNOSIS — Z7901 Long term (current) use of anticoagulants: Secondary | ICD-10-CM | POA: Diagnosis not present

## 2024-06-01 DIAGNOSIS — I253 Aneurysm of heart: Secondary | ICD-10-CM

## 2024-06-01 LAB — POCT INR: INR: 2.6 (ref 2.0–3.0)

## 2024-06-01 NOTE — Progress Notes (Signed)
 INR 2.6 Please see anticoagulation encounter continue taking 1.5 tablets daily except take 1 tablet Tuesday, Thursday, and Saturday. Eat greens every other day while taking Doxycycline  through 9/25. Recheck INR 4 weeks. Coumadin  Clinic [phone]

## 2024-06-01 NOTE — Patient Instructions (Signed)
 continue taking 1.5 tablets daily except take 1 tablet Tuesday, Thursday, and Saturday. Eat greens every other day while taking Doxycycline  through 9/25. Recheck INR 4 weeks. Coumadin  Clinic [phone]

## 2024-06-04 ENCOUNTER — Ambulatory Visit (INDEPENDENT_AMBULATORY_CARE_PROVIDER_SITE_OTHER)

## 2024-06-04 DIAGNOSIS — I5042 Chronic combined systolic (congestive) and diastolic (congestive) heart failure: Secondary | ICD-10-CM | POA: Diagnosis not present

## 2024-06-04 LAB — CUP PACEART REMOTE DEVICE CHECK
Battery Remaining Longevity: 102 mo
Battery Remaining Percentage: 91 %
Brady Statistic RV Percent Paced: 0 %
Date Time Interrogation Session: 20250925021100
HighPow Impedance: 64 Ohm
Implantable Lead Connection Status: 753985
Implantable Lead Implant Date: 20170217
Implantable Lead Location: 753860
Implantable Lead Model: 293
Implantable Lead Serial Number: 404753
Implantable Pulse Generator Implant Date: 20170217
Lead Channel Impedance Value: 803 Ohm
Lead Channel Pacing Threshold Amplitude: 0.9 V
Lead Channel Pacing Threshold Pulse Width: 0.4 ms
Lead Channel Setting Pacing Amplitude: 2 V
Lead Channel Setting Pacing Pulse Width: 0.4 ms
Lead Channel Setting Sensing Sensitivity: 0.5 mV
Pulse Gen Serial Number: 213123
Zone Setting Status: 755011

## 2024-06-06 ENCOUNTER — Ambulatory Visit: Payer: Self-pay | Admitting: Cardiovascular Disease

## 2024-06-08 NOTE — Progress Notes (Signed)
 Remote ICD Transmission

## 2024-06-11 ENCOUNTER — Other Ambulatory Visit: Payer: Self-pay | Admitting: Internal Medicine

## 2024-06-11 DIAGNOSIS — Z0000 Encounter for general adult medical examination without abnormal findings: Secondary | ICD-10-CM

## 2024-06-17 ENCOUNTER — Ambulatory Visit
Admission: RE | Admit: 2024-06-17 | Discharge: 2024-06-17 | Disposition: A | Discharge status: Home / Self Care | Source: Ambulatory Visit | Attending: Internal Medicine | Admitting: Internal Medicine

## 2024-06-17 DIAGNOSIS — Z0000 Encounter for general adult medical examination without abnormal findings: Secondary | ICD-10-CM

## 2024-06-29 ENCOUNTER — Ambulatory Visit: Attending: Cardiovascular Disease

## 2024-06-29 DIAGNOSIS — Z7901 Long term (current) use of anticoagulants: Secondary | ICD-10-CM | POA: Diagnosis not present

## 2024-06-29 DIAGNOSIS — I253 Aneurysm of heart: Secondary | ICD-10-CM | POA: Diagnosis not present

## 2024-06-29 LAB — POCT INR: INR: 2.9 (ref 2.0–3.0)

## 2024-06-29 NOTE — Patient Instructions (Signed)
 continue taking 1.5 tablets daily except take 1 tablet Tuesday, Thursday, and Saturday.  Recheck INR 6 weeks. Coumadin  Clinic [phone]

## 2024-06-29 NOTE — Progress Notes (Signed)
 INR 2.9 Please see anticoagulation encounter continue taking 1.5 tablets daily except take 1 tablet Tuesday, Thursday, and Saturday.  Recheck INR 6 weeks. Coumadin  Clinic [phone]

## 2024-08-10 ENCOUNTER — Ambulatory Visit: Attending: Cardiovascular Disease | Admitting: Pharmacist

## 2024-08-10 DIAGNOSIS — Z7901 Long term (current) use of anticoagulants: Secondary | ICD-10-CM | POA: Diagnosis not present

## 2024-08-10 DIAGNOSIS — I253 Aneurysm of heart: Secondary | ICD-10-CM

## 2024-08-10 LAB — POCT INR: INR: 3 (ref 2.0–3.0)

## 2024-08-10 NOTE — Patient Instructions (Signed)
 Description   INR 3.0: Continue taking 1.5 tablets daily except take 1 tablet Tuesday, Thursday, and Saturday.  Recheck INR 6 weeks. Coumadin  Clinic [phone]

## 2024-08-10 NOTE — Progress Notes (Signed)
 Description   INR 3.0: Continue taking 1.5 tablets daily except take 1 tablet Tuesday, Thursday, and Saturday.  Recheck INR 6 weeks. Coumadin  Clinic [phone]

## 2024-08-20 ENCOUNTER — Other Ambulatory Visit: Payer: Self-pay | Admitting: Internal Medicine

## 2024-09-03 ENCOUNTER — Ambulatory Visit

## 2024-09-03 DIAGNOSIS — I5042 Chronic combined systolic (congestive) and diastolic (congestive) heart failure: Secondary | ICD-10-CM

## 2024-09-07 LAB — CUP PACEART REMOTE DEVICE CHECK
Battery Remaining Longevity: 102 mo
Battery Remaining Percentage: 92 %
Brady Statistic RV Percent Paced: 0 %
Date Time Interrogation Session: 20251227223900
HighPow Impedance: 68 Ohm
Implantable Lead Connection Status: 753985
Implantable Lead Implant Date: 20170217
Implantable Lead Location: 753860
Implantable Lead Model: 293
Implantable Lead Serial Number: 404753
Implantable Pulse Generator Implant Date: 20170217
Lead Channel Impedance Value: 868 Ohm
Lead Channel Pacing Threshold Amplitude: 0.9 V
Lead Channel Pacing Threshold Pulse Width: 0.4 ms
Lead Channel Setting Pacing Amplitude: 2 V
Lead Channel Setting Pacing Pulse Width: 0.4 ms
Lead Channel Setting Sensing Sensitivity: 0.5 mV
Pulse Gen Serial Number: 213123
Zone Setting Status: 755011

## 2024-09-09 NOTE — Progress Notes (Signed)
 Remote ICD Transmission

## 2024-09-12 ENCOUNTER — Ambulatory Visit: Payer: Self-pay | Admitting: Cardiovascular Disease

## 2024-09-17 ENCOUNTER — Ambulatory Visit (INDEPENDENT_AMBULATORY_CARE_PROVIDER_SITE_OTHER)

## 2024-09-17 ENCOUNTER — Encounter: Payer: Self-pay | Admitting: Cardiovascular Disease

## 2024-09-17 ENCOUNTER — Ambulatory Visit: Attending: Cardiovascular Disease | Admitting: Cardiovascular Disease

## 2024-09-17 VITALS — BP 110/64 | HR 67 | Ht 67.0 in | Wt 179.0 lb

## 2024-09-17 DIAGNOSIS — I5042 Chronic combined systolic (congestive) and diastolic (congestive) heart failure: Secondary | ICD-10-CM | POA: Diagnosis not present

## 2024-09-17 DIAGNOSIS — E663 Overweight: Secondary | ICD-10-CM

## 2024-09-17 DIAGNOSIS — Z7901 Long term (current) use of anticoagulants: Secondary | ICD-10-CM

## 2024-09-17 DIAGNOSIS — E785 Hyperlipidemia, unspecified: Secondary | ICD-10-CM | POA: Diagnosis not present

## 2024-09-17 DIAGNOSIS — E1159 Type 2 diabetes mellitus with other circulatory complications: Secondary | ICD-10-CM

## 2024-09-17 DIAGNOSIS — Z95810 Presence of automatic (implantable) cardiac defibrillator: Secondary | ICD-10-CM

## 2024-09-17 DIAGNOSIS — Z794 Long term (current) use of insulin: Secondary | ICD-10-CM

## 2024-09-17 DIAGNOSIS — I2510 Atherosclerotic heart disease of native coronary artery without angina pectoris: Secondary | ICD-10-CM

## 2024-09-17 DIAGNOSIS — I253 Aneurysm of heart: Secondary | ICD-10-CM

## 2024-09-17 DIAGNOSIS — I10 Essential (primary) hypertension: Secondary | ICD-10-CM

## 2024-09-17 DIAGNOSIS — N1831 Chronic kidney disease, stage 3a: Secondary | ICD-10-CM

## 2024-09-17 LAB — POCT INR: INR: 2.7 (ref 2.0–3.0)

## 2024-09-17 MED ORDER — SACUBITRIL-VALSARTAN 49-51 MG PO TABS: 1.0000 | ORAL_TABLET | Freq: Two times a day (BID) | ORAL | 3 refills | Status: AC

## 2024-09-17 NOTE — Patient Instructions (Signed)
 Continue taking 1.5 tablets daily except take 1 tablet Tuesday, Thursday, and Saturday.  Recheck INR 6 weeks. Coumadin  Clinic [phone]

## 2024-09-17 NOTE — Patient Instructions (Signed)
 Medication Instructions:  Your physician recommends that you continue on your current medications as directed. Please refer to the Current Medication list given to you today.  *If you need a refill on your cardiac medications before your next appointment, please call your pharmacy*  Follow-Up: At [facility], you and your health needs are our priority.  As part of our continuing mission to provide you with exceptional heart care, our providers are all part of one team.  This team includes your primary Cardiologist (physician) and Advanced Practice Providers or APPs (Physician Assistants and Nurse Practitioners) who all work together to provide you with the care you need, when you need it.  Your next appointment:   1 year(s)   Provider:   Jerel Balding, MD

## 2024-09-17 NOTE — Progress Notes (Unsigned)
 " Cardiology Office Note:    Date:  09/17/2024   ID:  Desiree Harrison, DOB 1946/07/02, MRN [id number]  PCP:  Stephane Leita DEL, MD  Cardiologist:  Jerel Balding, MD  Electrophysiologist:  None   Referring MD: Stephane Leita DEL, MD   No chief complaint on file.   History of Present Illness:    Desiree Harrison is a 79 y.o. 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  She is on chronic warfarin anticoagulation.  She also has diabetes mellitus requiring insulin , complicated by neuropathy, hypertension and hypercholesterolemia, history of gout.  To date, she has not received therapy from her defibrillator.    She had previously seen Dr. Justino at [facility], most recent visit in August 2019.    She has had a good year from a cardiovascular point of view.  She has not required hospitalization for heart failure diuretic dose adjustment.  She denies any problems with chest pain at rest or with activity.  She does not have orthopnea, PND or lower extremity edema.  She has not had any defibrillator discharges, problems with palpitations or syncope.  She has not had any bleeding problems on warfarin.  She denies any recent falls or injuries.  She was seen in the emergency room in August when she had pneumonia.  She still has a residual cough, that it does not appear to be associated with laying down.  Device interrogation shows normal function.  Estimated battery longevity is 9 years and lead parameters are excellent.  She does not require ventricular pacing and has not had any episodes of high ventricular rates.  The heart rate histogram  distribution is normal.  From a metabolic point of view control is reasonably good.  Her LDL cholesterol is only 62 on atorvastatin  80 mg daily and her hemoglobin A1c is well-controlled at 6.1% on a combination of Jardiance and insulin .  Her HDL remains chronically low at 26.  Her INR is almost always in therapeutic range, most recently 2.6 earlier this month.  She has mild-moderate chronic kidney disease and the most recent creatinine was 1.37.  Her nephrologist is Dr. Marlee.  She has a history of cough with lisinopril.  She was intolerant to Farxiga, due to GI side effects, but is doing well with Jardiance.  Her initial myocardial infarction was in November 2016.  She did not have angina pectoris, but her myocardial infarction manifested as nausea and vomiting shortness of breath.  She presented late and she was found to have a persistently occluded LAD that was not amenable to PCI.  There were no other meaningful coronary stenoses.  She had an aneurysmal apex.  Roughly 3 months later she underwent implantation of a defibrillator(Dr. Dasie, single-chamber [employer] Inogen EL ICD VR D140 serial [id number], right ventricular lead [id number], DFT testing was not performed).  She has not required hospitalizations for heart failure as far as I can tell has never had documented LV apical thrombus or embolic events, but there was considerable concern about this possibility because of the aneurysmal apex.  She has noticed that the majority of her left arm is larger than the right.  She has not had  any lumps or tenderness in her breast and has never had breast cancer surgery.  There is no lymphadenopathy in her axilla.    Past Medical History:  Diagnosis Date   Acute systolic heart failure (HCC)    CAD in native artery    Cardiomyopathy (HCC)    Gout    Hard of hearing    Hypertension    Long term (current) use of anticoagulants    S/P internal cardiac defibrillator procedure    placed  10/28/15   STEMI (ST elevation myocardial infarction) (HCC)    Thyroid  disease    Type 2 diabetes mellitus, with long-term current use of insulin  (HCC)    Ventricular aneurysm     Past Surgical History:  Procedure Laterality Date   ABDOMINAL HYSTERECTOMY     APPENDECTOMY     BREAST BIOPSY Left    CARDIAC CATHETERIZATION     CARDIAC DEFIBRILLATOR PLACEMENT  2017   CHOLECYSTECTOMY     COLONOSCOPY     VIDEO BRONCHOSCOPY WITH ENDOBRONCHIAL ULTRASOUND N/A 01/20/2019   Procedure: VIDEO BRONCHOSCOPY WITH BIOPSY;  Surgeon: Mannam, Praveen, MD;  Location: [facility] OR;  Service: Pulmonary;  Laterality: N/A;    Current Medications: Current Meds  Medication Sig   Semaglutide, 2 MG/DOSE, (OZEMPIC, 2 MG/DOSE,) 8 MG/3ML SOPN Inject 2 mg into the skin once a week.   spironolactone  (ALDACTONE ) 25 MG tablet TAKE 1/2 TABLET BY MOUTH EVERY DAY     Allergies:   Lisinopril, Metformin, and Sitagliptin   Social History   Socioeconomic History   Marital status: Widowed    Spouse name: Not on file   Number of children: Not on file   Years of education: Not on file   Highest education level: Not on file  Occupational History   Not on file  Tobacco Use   Smoking status: Former    Current packs/day: 0.00    Average packs/day: 0.3 packs/day    Types: Cigarettes    Quit date: 12/30/1963    Years since quitting: 60.7   Smokeless tobacco: Never   Tobacco comments:    pt smoked 5-6/cigs daily 12/30/2018  Vaping Use   Vaping status: Never Used  Substance and Sexual Activity   Alcohol use: Not Currently   Drug use: Never   Sexual activity: Not Currently    Birth control/protection: Surgical, Post-menopausal  Other Topics Concern   Not on file  Social History Narrative   Not on file   Social Drivers of Health   Tobacco Use: Medium Risk (09/17/2024)   Patient History    Smoking Tobacco Use: Former    Smokeless Tobacco Use: Never    Passive Exposure: Not on Actuary Strain: Not on  file  Food Insecurity: Not on file  Transportation Needs: Not on file  Physical Activity: Not on file  Stress: Not on file  Social Connections: Not on file  Depression (EYV7-0): Not on file  Alcohol Screen: Not on file  Housing: Not on file  Utilities: Not on file  Health Literacy: Not on file     Family History: The patient's family history includes Breast cancer in her mother; Coronary artery disease in her mother; Diabetes in her mother; Stroke in her father. There is no history of Colon cancer, Rectal cancer, or Stomach cancer.  The patient's mother had bypass surgery at age 52 but lived to be 78 years old.  Patient's grandmother died with a myocardial infarction at age 60.  ROS:   Please  see the history of present illness.     All other systems are reviewed and are negative.   EKGs/Labs/Other Studies Reviewed:     EKG:  EKG is not ordered today.  Personally reviewed the tracing from 04/13/2023 which shows sinus rhythm and Q waves in leads V1-V2, very similar to previous tracings.  She has very flat T waves across the precordial leads and it is hard to accurately measure her QTc, but it appears to be prolonged (she was on Levaquin  at the time for pneumonia).    Recent Labs: No results found for requested labs within last 365 days.   11/18/2020 Hemoglobin A1c 7.7%, potassium 5.2, normal liver function test, TSH 3.79, INR 2.50  04/25/2022 Labs from PCP Hemoglobin A1c 6.4% Potassium 4.3, TSH 4.25, ALT 12, creatinine 1.19  05/07/2023 Hemoglobin 15.2, hemoglobin A1c 6.1%, potassium 4.7, creatinine 1.37, TSH 2.52  Recent Lipid Panel    Component Value Date/Time   CHOL 104 03/10/2020 0958   TRIG 125 03/10/2020 0958   HDL 31 (L) 03/10/2020 0958   CHOLHDL 3.4 03/10/2020 0958   LDLCALC 50 03/10/2020 0958  11/18/2020 Cholesterol 109, HDL 29, LDL 56, triglycerides 118  04/25/2022 Cholesterol 91, HDL 17, LDL 55, triglycerides 95  91/72/7975 Cholesterol 104, HDL 26, LDL 62,  triglycerides 82  Physical Exam:    VS:  Pulse 67   Ht 5' 7 (1.702 m)   Wt 179 lb (81.2 kg)   SpO2 96%   BMI 28.04 kg/m   Wt Readings from Last 3 Encounters:  09/17/24 179 lb (81.2 kg)  03/05/24 187 lb (84.8 kg)  08/21/23 193 lb 3.2 oz (87.6 kg)    General: Alert, oriented x3, no distress, severely obese.  Healthy subclavian defibrillator site Head: no evidence of trauma, PERRL, EOMI, no exophtalmos or lid lag, no myxedema, no xanthelasma; normal ears, nose and oropharynx Neck: normal jugular venous pulsations and no hepatojugular reflux; brisk carotid pulses without delay and no carotid bruits Chest: clear to auscultation, no signs of consolidation by percussion or palpation, normal fremitus, symmetrical and full respiratory excursions Cardiovascular: normal position and quality of the apical impulse, regular rhythm, normal first and second heart sounds, no murmurs, rubs or gallops Abdomen: no tenderness or distention, no masses by palpation, no abnormal pulsatility or arterial bruits, normal bowel sounds, no hepatosplenomegaly Extremities: no clubbing, cyanosis or edema; 2+ radial, ulnar and brachial pulses bilaterally; 2+ right femoral, posterior tibial and dorsalis pedis pulses; 2+ left femoral, posterior tibial and dorsalis pedis pulses; no subclavian or femoral bruits Neurological: grossly nonfocal Psych: Normal mood and affect    ASSESSMENT:    1. Chronic combined systolic and diastolic heart failure (HCC)   2. Long term (current) use of anticoagulants   3. Ventricular aneurysm   4. Essential hypertension      PLAN:    In order of problems listed above:  CHF: Clinically euvolemic NYHA functional class I without the need for loop diuretics, almost full recovery of LVEF per last echo.  On full guideline directed medical therapy with carvedilol  at maximum dose, Entresto , Aldactone  and Jardiance.   CAD: Asymptomatic.  Does not have chest pain but never had angina before  her myocardial infarction (presented with nausea vomiting and dyspnea).  On aspirin  and statin and beta-blocker. LV aneurysm: Without actual thromboembolic events.  On chronic anticoagulation. Warfarin: Denies bleeding complication.  INR is almost always in therapeutic range.  Offered option to switch to Eliquis if warfarin becomes a burden or control  becomes erratic. ICD:.  No evidence of any high ventricular rates.  Normal device function.  Continue remote downloads every 3 months. HLP: Excellent LDL cholesterol, chronically low HDL DM: Well controlled HTN: Well-controlled on current medications CKD 3: Followed by Dr. Marlee.  Stable creatinine. Overweight: She has done a great job with weight loss since she used to be severely obese.    Medication Adjustments/Labs and Tests Ordered: Current medicines are reviewed at length with the patient today.  Concerns regarding medicines are outlined above.  Orders Placed This Encounter  Procedures   EKG 12-Lead   No orders of the defined types were placed in this encounter.   There are no Patient Instructions on file for this visit.   Signed, Jerel Balding, MD  09/17/2024 10:42 AM     [facility] "

## 2024-09-17 NOTE — Progress Notes (Signed)
"   INR 2.7 Please see anticoagulation encounter Continue taking 1.5 tablets daily except take 1 tablet Tuesday, Thursday, and Saturday.  Recheck INR 6 weeks. Coumadin  Clinic [phone] "

## 2024-09-24 ENCOUNTER — Telehealth: Payer: Self-pay | Admitting: Emergency Medicine

## 2024-09-24 NOTE — Telephone Encounter (Signed)
 Fax requesting lipid panel from 2025

## 2024-09-25 ENCOUNTER — Encounter: Payer: Self-pay | Admitting: Internal Medicine

## 2024-09-25 ENCOUNTER — Telehealth: Payer: Self-pay

## 2024-09-25 ENCOUNTER — Ambulatory Visit: Admitting: Internal Medicine

## 2024-09-25 VITALS — BP 118/74 | HR 82 | Ht 67.0 in | Wt 179.0 lb

## 2024-09-25 DIAGNOSIS — K219 Gastro-esophageal reflux disease without esophagitis: Secondary | ICD-10-CM

## 2024-09-25 DIAGNOSIS — K449 Diaphragmatic hernia without obstruction or gangrene: Secondary | ICD-10-CM | POA: Diagnosis not present

## 2024-09-25 DIAGNOSIS — Z860100 Personal history of colon polyps, unspecified: Secondary | ICD-10-CM | POA: Diagnosis not present

## 2024-09-25 DIAGNOSIS — K225 Diverticulum of esophagus, acquired: Secondary | ICD-10-CM

## 2024-09-25 MED ORDER — NA SULFATE-K SULFATE-MG SULF 17.5-3.13-1.6 GM/177ML PO SOLN: 1.0000 | Freq: Once | ORAL | 0 refills | Status: AC

## 2024-09-25 NOTE — Telephone Encounter (Signed)
 Pharmacy, can you please provide recommendations for holding Coumadin  for upcoming colonoscopy?  Thank you!

## 2024-09-25 NOTE — Progress Notes (Signed)
 "  Chief Complaint: Polyp surveillance  HPI : 46 year with history of CAD, HFrEF (TTE 50%) s/p ICD, left ventricular aneurysm on warfarin, GERD, and obesity presents to discuss getting a colonoscopy for polyp surveillance  Interval History: Overall she is doing very well. She wishes to get another colonoscopy for polyp surveillance. Denies blood in the stools or changes in bowel habits. Her reflux is currently well controlled on Prilosec BID. Denies any dysphagia ever since her dilation. Endorses intentional weight loss on Ozempic.  Wt Readings from Last 3 Encounters:  09/25/24 179 lb (81.2 kg)  09/17/24 179 lb (81.2 kg)  03/05/24 187 lb (84.8 kg)   Past Medical History:  Diagnosis Date   Acute systolic heart failure (HCC)    CAD in native artery    Cardiomyopathy (HCC)    Gout    Hard of hearing    Hypertension    Long term (current) use of anticoagulants    S/P internal cardiac defibrillator procedure    placed 10/28/15   STEMI (ST elevation myocardial infarction) (HCC)    Thyroid  disease    Type 2 diabetes mellitus, with long-term current use of insulin  (HCC)    Ventricular aneurysm      Past Surgical History:  Procedure Laterality Date   ABDOMINAL HYSTERECTOMY     APPENDECTOMY     BREAST BIOPSY Left    CARDIAC CATHETERIZATION     CARDIAC DEFIBRILLATOR PLACEMENT  2017   CHOLECYSTECTOMY     COLONOSCOPY     VIDEO BRONCHOSCOPY WITH ENDOBRONCHIAL ULTRASOUND N/A 01/20/2019   Procedure: VIDEO BRONCHOSCOPY WITH BIOPSY;  Surgeon: Mannam, Praveen, MD;  Location: [facility] OR;  Service: Pulmonary;  Laterality: N/A;   Family History  Problem Relation Age of Onset   Breast cancer Mother    Coronary artery disease Mother    Diabetes Mother    Stroke Father    Colon cancer Neg Hx    Rectal cancer Neg Hx    Stomach cancer Neg Hx    Social History   Tobacco Use   Smoking status: Former    Current packs/day: 0.00    Average packs/day: 0.3 packs/day    Types: Cigarettes    Quit  date: 12/30/1963    Years since quitting: 60.7   Smokeless tobacco: Never   Tobacco comments:    pt smoked 5-6/cigs daily 12/30/2018  Vaping Use   Vaping status: Never Used  Substance Use Topics   Alcohol use: Not Currently   Drug use: Never   Current Outpatient Medications  Medication Sig Dispense Refill   aspirin  EC 81 MG tablet Take 81 mg by mouth daily.     atorvastatin  (LIPITOR ) 80 MG tablet TAKE 1 TABLET BY MOUTH EVERY DAY 90 tablet 3   Blood Glucose Monitoring Suppl (ONETOUCH VERIO) w/Device KIT Use to check FSBS three times a day. Dx: E11.59, Z79.4 1 kit 0   carvedilol  (COREG ) 25 MG tablet Take 1 tablet (25 mg total) by mouth 2 (two) times daily with a meal. 180 tablet 1   fluticasone  (FLONASE  ALLERGY  RELIEF) 50 MCG/ACT nasal spray Place 2 sprays into both nostrils as needed.     gabapentin  (NEURONTIN ) 300 MG capsule Take 300 mg by mouth 2 (two) times daily.     glucose blood (ONETOUCH VERIO) test strip Use to check FSBS three times a day. Dx: E11.59, Z79.4 100 each 2   HUMALOG  MIX 50/50 KWIKPEN (50-50) 100 UNIT/ML Kwikpen Inject 26 Units into the skin 2 (two) times  daily. Dx: E11.59, Z79.4 15 mL 5   Insulin  Pen Needle (BD PEN NEEDLE MICRO U/F) 32G X 6 MM MISC Use twice a day with Humalog  Mix Kwikpen. Dx: E11.59, Z79.4 100 each 5   JARDIANCE 10 MG TABS tablet TAKE ONE TABLET BY MOUTH DAILY 90 tablet 2   Lancet Devices (ONE TOUCH DELICA LANCING DEV) MISC Use to check FSBS three times a day. Dx: E11.59, Z79.4 1 each 0   levothyroxine  (SYNTHROID ) 50 MCG tablet TAKE 1 TABLET BY MOUTH EVERY DAY 30 tablet 1   omeprazole  (PRILOSEC) 40 MG capsule TAKE 1 CAPSULE BY MOUTH TWICE A DAY 90 capsule 1   sacubitril -valsartan  (ENTRESTO ) 49-51 MG Take 1 tablet by mouth 2 (two) times daily. 180 tablet 3   Semaglutide, 2 MG/DOSE, (OZEMPIC, 2 MG/DOSE,) 8 MG/3ML SOPN Inject 2 mg into the skin once a week.     spironolactone  (ALDACTONE ) 25 MG tablet TAKE 1/2 TABLET BY MOUTH EVERY DAY 45 tablet 1    warfarin (COUMADIN ) 3 MG tablet MWFSun take 1.5 tablets and 1 tablet every other day Orally Once a day     No current facility-administered medications for this visit.   Allergies  Allergen Reactions   Lisinopril Cough   Metformin Nausea Only    Other reaction(s): nausea   Sitagliptin Nausea Only    Other reaction(s): nausea    Physical Exam: BP 118/74   Pulse 82   Ht 5' 7 (1.702 m)   Wt 179 lb (81.2 kg)   SpO2 97%   BMI 28.04 kg/m  Constitutional: Pleasant,well-developed, female in no acute distress. HEENT: Normocephalic and atraumatic. Conjunctivae are normal. No scleral icterus. Cardiovascular: Normal rate, regular rhythm.  Pulmonary/chest: Effort normal and breath sounds normal. No wheezing, rales or rhonchi. Abdominal: Soft, nondistended, nontender. Bowel sounds active throughout. There are no masses palpable. No hepatomegaly. Extremities: No edema Neurological: Alert and oriented to person place and time. Skin: Skin is warm and dry. No rashes noted. Psychiatric: Normal mood and affect. Behavior is normal.  Labs 06/2020: Unremarkable CBC  Labs 05/2021: BMP with mildly elevated Cr of 1.19.  Labs 04/2023: CBC unremarkable. BMP with mildly elevated Cr of 1.37  CT A/P w/o contrast 06/10/16: ABDOMEN:  .  Liver: Within normal limits.  .  Gallbladder/biliary: Cholecystectomy. Small calcification measuring 1 to 2 mm adjacent to the common hepatic duct. I suspect that this is extrinsic.  SABRA  Spleen: Within normal limits.  .  Pancreas: Within normal limits.  .  Adrenals: Within normal limits.  .  Kidneys: Bilateral 2 to 4 mm nonobstructing intrarenal calculi. No ureteral calculi.  SABRA  Peritoneum: Within normal limits.  .  Mesentery: Within normal limits.  .  Extraperitoneum: Retroperitoneal phleboliths on the left simulating ureteral calculi.  .  GI tract: Small hiatal hernia. Normal small intestine. Appendix not visualized. No appendiceal pathology. Mild retained fecal  material. Colonic diverticulosis no evidence of diverticulitis.  .  Vascular: Within normal limits.    TTE 02/20/17: SUMMARY  There is mild concentric left ventricular hypertrophy.  Left ventricular systolic function is normal.  LV ejection fraction = 50-55%.  Left ventricular filling pattern is impaired relaxation.  The left atrial size is normal.  There is no aortic stenosis.  There is trace mitral regurgitation.  There is mild tricuspid regurgitation.  There is no pericardial effusion.  Improvement in EF MUGA 18.5 % 09/15/15  Clinical correlation is recommended.   Chest CT w/o contrast 05/27/23: IMPRESSION: 1. Redemonstration of ground-glass  and nodular opacities of the right upper lobe, many solid nodules are new when compared with the recent prior exam, concerning for worsening infectious process, including atypical etiologies. Recommend short-term follow-up in 3 months to ensure resolution. 2. Solid nodule of the right middle lobe measuring up to 1.7 cm, unchanged in size when compared with Jan 17, 2019 prior and likely benign. 3. Small-to-moderate hiatal hernia. 4. Coronary artery calcifications and aortic Atherosclerosis (ICD10-I70.0).  MBS 07/05/23: IMPRESSION: *Moderate volume silent aspiration of high-density barium on the standing LPO position. *The examination was terminated after aspiration.  Colonoscopy 08/25/21: - Six 3 to 8 mm polyps in the sigmoid colon, in the transverse colon and in the ascending colon, removed with a cold snare. Resected and retrieved. - Diverticulosis in the sigmoid colon, in the descending colon and in the transverse colon. - Non- bleeding internal hemorrhoids. Path: Surgical [P], colon, ascending, transverse, sigmoid, polyp (6) - TUBULAR ADENOMA(S) - NEGATIVE FOR HIGH-GRADE DYSPLASIA OR MALIGNANCY  EGD 08/02/23: - Zenker' s diverticulum. - White nummular lesions in esophageal mucosa. Biopsied. - Benign- appearing esophageal stenosis.  Dilated. - 3 cm hiatal hernia. - Gastritis. Biopsied. - Normal examined duodenum. Path: 1. Surgical [P], gastric bx's :       GASTRIC ANTRAL/OXYNTIC MUCOSA WITH FOCAL MILD CHRONIC INACTIVE GASTRITIS.       NO H.PYLORI IDENTIFIED ON H&E STAIN.       NEGATIVE FOR INTESTINAL METAPLASIA OR DYSPLASIA.       2. Surgical [P], esophageal bx's :       ACTIVE ESOPHAGITIS WITH REACTIVE EPITHELIAL CHANGES.       GMS STAIN WILL BE REPORTED IN AN ADDENDUM.       NEGATIVE FOR DYSPLASIA.   ASSESSMENT AND PLAN: History of colon polyps GERD 3 cm hiatal hernia Zenker's diverticulum Patient presents to discuss getting a colonoscopy for polyp surveillance. She doesn't have any symptom at this time. Her GERD is well controlled on PPI BID. Dysphagia has improved after esophageal dilation and with PPI therapy - Previously gave GERD handout - Cont aspiration precautions - Continue Prilosec 40 mg BID. Discussed whether or not to decrease the dose but the patient prefers to continue at this dose for now - Colonoscopy [facility] with 2-day prep. Will need warfarin held beforehand so will touch base with her cardiologist. Will have Ozempic held a week ago.   Desiree Kidney, MD  I spent 25 minutes of time, including in depth chart review, independent review of results as outlined above, communicating results with the patient directly, face-to-face time with the patient, coordinating care, ordering studies and medications as appropriate, and documentation.    "

## 2024-09-25 NOTE — Telephone Encounter (Signed)
 Spoke with patient she is aware of her new set of instructions and will go to the pharmacy to see if medication is too costly and will let me know on Monday

## 2024-09-25 NOTE — Telephone Encounter (Addendum)
 [facility] Pre-operative Risk Assessment     Request for surgical clearance:     Endoscopy Procedure  What type of surgery is being performed?     Colonoscopy  When is this surgery scheduled?     10-08-24  What type of clearance is required ?   Pharmacy  Are there any medications that need to be held prior to surgery and how long? Warfarin 7 days prior  Practice name and name of physician performing surgery?      [facility]  What is your office phone and fax number?      Phone- [phone]  Fax- [phone]  Anesthesia type (None, local, MAC, general) ?       MAC   Please route your response to [facility])

## 2024-09-25 NOTE — Patient Instructions (Addendum)
 _______________________________________________________  If your blood pressure at your visit was 140/90 or greater, please contact your primary care physician to follow up on this.  _______________________________________________________  If you are age 78 or older, your body mass index should be between 23-30. Your Body mass index is 28.04 kg/m. If this is out of the aforementioned range listed, please consider follow up with your Primary Care Provider.  If you are age 38 or younger, your body mass index should be between 19-25. Your Body mass index is 28.04 kg/m. If this is out of the aformentioned range listed, please consider follow up with your Primary Care Provider.   ________________________________________________________  The [facility] providers would like to encourage you to use MYCHART to communicate with providers for non-urgent requests or questions.  Due to long hold times on the telephone, sending your provider a message by [facility] may be a faster and more efficient way to get a response.  Please allow 48 business hours for a response.  Please remember that this is for non-urgent requests.  _______________________________________________________  [facility] is using a team-based approach to care.  Your team is made up of your doctor and two to three APPS. Our APPS (Nurse Practitioners and Physician Assistants) work with your physician to ensure care continuity for you. They are fully qualified to address your health concerns and develop a treatment plan. They communicate directly with your gastroenterologist to care for you. Seeing the Advanced Practice Practitioners on your physician's team can help you by facilitating care more promptly, often allowing for earlier appointments, access to diagnostic testing, procedures, and other specialty referrals.   You will be contacted by our office prior to your procedure for directions on holding your blood thinner.  If you  do not hear from our office 1 week prior to your scheduled procedure, please call [phone] to discuss.   You have been scheduled for a colonoscopy. Please follow written instructions given to you at your visit today.   If you use inhalers (even only as needed), please bring them with you on the day of your procedure.  DO NOT TAKE 7 DAYS PRIOR TO TEST- Trulicity (dulaglutide) Ozempic, Wegovy (semaglutide) Mounjaro, Zepbound (tirzepatide) Bydureon Bcise (exanatide extended release)  DO NOT TAKE 1 DAY PRIOR TO YOUR TEST Rybelsus (semaglutide) Adlyxin (lixisenatide) Victoza (liraglutide) Byetta (exanatide) ___________________________________________________________________________  It was a pleasure to see you today!  Thank you for trusting me with your gastrointestinal care!

## 2024-09-28 NOTE — Telephone Encounter (Signed)
"  ° °  Patient Name: Desiree Harrison  DOB: 01/10/1946 MRN: [id number]  Primary Cardiologist: Jerel Balding, MD  Clinical pharmacists have reviewed the patient's past medical history, labs, and current medications as part of preoperative protocol coverage. 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     Procedure: colonoscopy Date of procedure: 10/08/24   Per Dr. Balding ok to hold warfarin 5 days WITHOUT a bridge.   I will route this recommendation to the requesting party via Epic fax function and remove from pre-op pool.  Please call with questions.  Lamarr Satterfield, NP 09/28/2024, 10:52 AM  "

## 2024-09-28 NOTE — Telephone Encounter (Signed)
 Thank you :)

## 2024-09-28 NOTE — Telephone Encounter (Signed)
 LVM for patient to call back. Will hold blood thinner 5 days prior as well

## 2024-09-28 NOTE — Telephone Encounter (Signed)
 Dr Federico please advise  Is it ok to hold 5 days prior?

## 2024-09-28 NOTE — Telephone Encounter (Signed)
 Given more dates for her to see because she doesn't know what day is good for her daughter. Told her to call by Friday because she would hold her blood thinner starting the 24th so we need to know about prep and date since she hasn't gotten her prep and hasn't confirmed with her daughter

## 2024-09-28 NOTE — Telephone Encounter (Signed)
 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    Procedure: colonoscopy Date of procedure: 10/08/24  Per Dr. Francyne ok to hold warfarin 5 days WITHOUT a bridge.  **This guidance is not considered finalized until pre-operative APP has relayed final recommendations.**

## 2024-09-28 NOTE — Telephone Encounter (Signed)
 Patient returning call. Advised patient regarding holding blood thinner. Patient would like a call back. Please advise, thank you

## 2024-09-29 NOTE — Telephone Encounter (Signed)
 Told patient she needs to go to the pharmacy to see about prep and if I haven't heard from her by Friday I will call her

## 2024-09-29 NOTE — Telephone Encounter (Signed)
 Patient rescheduled to 2/6. Requesting a call to discuss prep and blood thinner. Please advise, thank you

## 2024-10-01 NOTE — Addendum Note (Signed)
 Addended by: KATHIE BOTTCHER E on: 10/01/2024 03:28 PM   Modules accepted: Orders

## 2024-10-01 NOTE — Telephone Encounter (Signed)
 Patient said she saw the instructions and have her prep and is aware that she will hold her blood thinner 5 days prior. Told her to call with any questions or concerns in the meantime

## 2024-10-01 NOTE — Telephone Encounter (Signed)
 Patient returning call to advise that she has picked up her prep. Please advise.

## 2024-10-08 ENCOUNTER — Encounter: Admitting: Internal Medicine

## 2024-10-14 ENCOUNTER — Telehealth: Payer: Self-pay | Admitting: Internal Medicine

## 2024-10-14 NOTE — Telephone Encounter (Signed)
 Patient is requesting a call to discuss the humalog  medication for upcoming colonoscopy. Please advise, Thank you.

## 2024-10-14 NOTE — Telephone Encounter (Signed)
 Patient made aware to reference instructions for diabetic medications and questions answered

## 2024-10-16 ENCOUNTER — Ambulatory Visit: Admitting: Internal Medicine

## 2024-10-16 ENCOUNTER — Encounter: Payer: Self-pay | Admitting: Internal Medicine

## 2024-10-16 VITALS — BP 94/56 | HR 83 | Temp 97.3°F | Resp 13 | Ht 67.0 in | Wt 179.0 lb

## 2024-10-16 DIAGNOSIS — D123 Benign neoplasm of transverse colon: Secondary | ICD-10-CM

## 2024-10-16 DIAGNOSIS — D128 Benign neoplasm of rectum: Secondary | ICD-10-CM

## 2024-10-16 DIAGNOSIS — D122 Benign neoplasm of ascending colon: Secondary | ICD-10-CM

## 2024-10-16 DIAGNOSIS — Z860100 Personal history of colon polyps, unspecified: Secondary | ICD-10-CM

## 2024-10-16 MED ORDER — SODIUM CHLORIDE 0.9 % IV SOLN: 500.0000 mL | Freq: Once | INTRAVENOUS | Status: DC

## 2024-10-16 NOTE — Progress Notes (Signed)
 Vss nad trans to pacu

## 2024-10-16 NOTE — Progress Notes (Signed)
 Called to room to assist during endoscopic procedure.  Patient ID and intended procedure confirmed with present staff. Received instructions for my participation in the procedure from the performing physician.

## 2024-10-16 NOTE — Op Note (Signed)
 [facility] Patient Name: Desiree Harrison Procedure Date: 10/16/2024 12:50 PM MRN: [id number] Endoscopist: Rosario Estefana Kidney , , [id number] Age: 79 Referring MD:  Date of Birth: 1946-02-12 Gender: Female Account #: [id number] Procedure:                Colonoscopy Indications:              High risk colon cancer surveillance: Personal                            history of colonic polyps Medicines:                Monitored Anesthesia Care Procedure:                Pre-Anesthesia Assessment:                           - Prior to the procedure, a History and Physical                            was performed, and patient medications and                            allergies were reviewed. The patient's tolerance of                            previous anesthesia was also reviewed. The risks                            and benefits of the procedure and the sedation                            options and risks were discussed with the patient.                            All questions were answered, and informed consent                            was obtained. Prior Anticoagulants: The patient has                            taken Coumadin  (warfarin), last dose was 7 days                            prior to procedure. ASA Grade Assessment: III - A                            patient with severe systemic disease. After                            reviewing the risks and benefits, the patient was                            deemed in satisfactory condition to undergo the  procedure.                           After obtaining informed consent, the colonoscope                            was passed under direct vision. Throughout the                            procedure, the patient's blood pressure, pulse, and                            oxygen saturations were monitored continuously. The                            PCF-[id number] Slim SN [id number] was introduced through                             the anus and advanced to the the cecum, identified                            by appendiceal orifice and ileocecal valve. The                            colonoscopy was performed without difficulty. The                            patient tolerated the procedure well. The quality                            of the bowel preparation was excellent. The                            ileocecal valve, appendiceal orifice, and rectum                            were photographed. Scope In: 1:30:36 PM Scope Out: 1:54:35 PM Scope Withdrawal Time: 0 hours 13 minutes 27 seconds  Total Procedure Duration: 0 hours 23 minutes 59 seconds  Findings:                 Two sessile polyps were found in the transverse                            colon and ascending colon. The polyps were 3 to 4                            mm in size. These polyps were removed with a cold                            snare. Resection and retrieval were complete.                           Multiple diverticula were found in the sigmoid  colon, descending colon and transverse colon.                           A 3 mm polyp was found in the rectum. The polyp was                            sessile. The polyp was removed with a cold snare.                            Resection and retrieval were complete.                           Non-bleeding external and internal hemorrhoids were                            found during retroflexion. Complications:            No immediate complications. Estimated Blood Loss:     Estimated blood loss was minimal. Impression:               - Two 3 to 4 mm polyps in the transverse colon and                            in the ascending colon, removed with a cold snare.                            Resected and retrieved.                           - Diverticulosis in the sigmoid colon, in the                            descending colon and in the transverse colon.                            - One 3 mm polyp in the rectum, removed with a cold                            snare. Resected and retrieved.                           - Non-bleeding external and internal hemorrhoids. Recommendation:           - Discharge patient to home (with escort).                           - Await pathology results.                           - Okay to restart your warfarin tonight.                           - The findings and recommendations were discussed  with the patient. Dr Estefana Federico Rosario Estefana Federico,  10/16/2024 2:02:24 PM

## 2024-10-16 NOTE — Patient Instructions (Signed)
-   Discharge patient to home (with escort). - Await pathology results. - Okay to restart your warfarin tonight.  YOU HAD AN ENDOSCOPIC PROCEDURE TODAY AT THE  ENDOSCOPY CENTER:   Refer to the procedure report that was given to you for any specific questions about what was found during the examination.  If the procedure report does not answer your questions, please call your gastroenterologist to clarify.  If you requested that your care partner not be given the details of your procedure findings, then the procedure report has been included in a sealed envelope for you to review at your convenience later.  YOU SHOULD EXPECT: Some feelings of bloating in the abdomen. Passage of more gas than usual.  Walking can help get rid of the air that was put into your GI tract during the procedure and reduce the bloating. If you had a lower endoscopy (such as a colonoscopy or flexible sigmoidoscopy) you may notice spotting of blood in your stool or on the toilet paper. If you underwent a bowel prep for your procedure, you may not have a normal bowel movement for a few days.  Please Note:  You might notice some irritation and congestion in your nose or some drainage.  This is from the oxygen used during your procedure.  There is no need for concern and it should clear up in a day or so.  SYMPTOMS TO REPORT IMMEDIATELY:  Following lower endoscopy (colonoscopy or flexible sigmoidoscopy):  Excessive amounts of blood in the stool  Significant tenderness or worsening of abdominal pains  Swelling of the abdomen that is new, acute  Fever of 100F or higher  For urgent or emergent issues, a gastroenterologist can be reached at any hour by calling [phone]. Do not use MyChart messaging for urgent concerns.    DIET:  We do recommend a small meal at first, but then you may proceed to your regular diet.  Drink plenty of fluids but you should avoid alcoholic beverages for 24 hours.  ACTIVITY:  You should  plan to take it easy for the rest of today and you should NOT DRIVE or use heavy machinery until tomorrow (because of the sedation medicines used during the test).    FOLLOW UP: Our staff will call the number listed on your records the next business day following your procedure.  We will call around 7:15- 8:00 am to check on you and address any questions or concerns that you may have regarding the information given to you following your procedure. If we do not reach you, we will leave a message.     If any biopsies were taken you will be contacted by phone or by letter within the next 1-3 weeks.  Please call us  at [phone] if you have not heard about the biopsies in 3 weeks.    SIGNATURES/CONFIDENTIALITY: You and/or your care partner have signed paperwork which will be entered into your electronic medical record.  These signatures attest to the fact that that the information above on your After Visit Summary has been reviewed and is understood.  Full responsibility of the confidentiality of this discharge information lies with you and/or your care-partner.

## 2024-10-16 NOTE — Progress Notes (Signed)
 "   GASTROENTEROLOGY PROCEDURE H&P NOTE   Primary Care Physician: Stephane Leita DEL, MD    Reason for Procedure:   History of colon polyps  Plan:    Colonoscopy  Patient is appropriate for endoscopic procedure(s) in the ambulatory ([facility]) setting.  The nature of the procedure, as well as the risks, benefits, and alternatives were carefully and thoroughly reviewed with the patient. Ample time for discussion and questions allowed. The patient understood, was satisfied, and agreed to proceed.     HPI: Desiree Harrison is a 79 y.o. female who presents for colonoscopy for evaluation of history of colon polyps.  Patient was most recently seen in the Gastroenterology Clinic on 09/25/24.  No interval change in medical history since that appointment. Please refer to that note for full details regarding GI history and clinical presentation.   Past Medical History:  Diagnosis Date   Acute systolic heart failure (HCC)    CAD in native artery    Cardiomyopathy (HCC)    Gout    Hard of hearing    Hypertension    Long term (current) use of anticoagulants    S/P internal cardiac defibrillator procedure    placed 10/28/15   STEMI (ST elevation myocardial infarction) (HCC)    Thyroid  disease    Type 2 diabetes mellitus, with long-term current use of insulin  (HCC)    Ventricular aneurysm     Past Surgical History:  Procedure Laterality Date   ABDOMINAL HYSTERECTOMY     APPENDECTOMY     BREAST BIOPSY Left    CARDIAC CATHETERIZATION     CARDIAC DEFIBRILLATOR PLACEMENT  2017   CHOLECYSTECTOMY     COLONOSCOPY     VIDEO BRONCHOSCOPY WITH ENDOBRONCHIAL ULTRASOUND N/A 01/20/2019   Procedure: VIDEO BRONCHOSCOPY WITH BIOPSY;  Surgeon: Mannam, Praveen, MD;  Location: [facility] OR;  Service: Pulmonary;  Laterality: N/A;    Prior to Admission medications  Medication Sig Start Date End Date Taking? Authorizing Provider  aspirin  EC 81 MG tablet Take 81 mg by mouth daily.    [provider]  atorvastatin   (LIPITOR ) 80 MG tablet TAKE 1 TABLET BY MOUTH EVERY DAY 02/11/20   Meng, Hao, PA  Blood Glucose Monitoring Suppl (ONETOUCH VERIO) w/Device KIT Use to check FSBS three times a day. Dx: E11.59, Z79.4 12/31/19   Prentiss Dorothyann Maxwell, MD  carvedilol  (COREG ) 25 MG tablet Take 1 tablet (25 mg total) by mouth 2 (two) times daily with a meal. 04/17/19   Croitoru, Mihai, MD  fluticasone  (FLONASE  ALLERGY  RELIEF) 50 MCG/ACT nasal spray Place 2 sprays into both nostrils as needed.    [provider]  gabapentin  (NEURONTIN ) 300 MG capsule Take 300 mg by mouth 2 (two) times daily.    [provider]  glucose blood (ONETOUCH VERIO) test strip Use to check FSBS three times a day. Dx: E11.59, Z79.4 12/31/19   Prentiss Dorothyann Maxwell, MD  HUMALOG  MIX 50/50 KWIKPEN (50-50) 100 UNIT/ML Kwikpen Inject 26 Units into the skin 2 (two) times daily. Dx: E11.59, Z79.4 08/25/20   Prentiss Dorothyann Maxwell, MD  Insulin  Pen Needle (BD PEN NEEDLE MICRO U/F) 32G X 6 MM MISC Use twice a day with Humalog  Mix Kwikpen. Dx: E11.59, Z79.4 08/25/20   Prentiss Dorothyann Maxwell, MD  JARDIANCE 10 MG TABS tablet TAKE ONE TABLET BY MOUTH DAILY 04/15/24   Croitoru, Jerel, MD  Lancet Devices (ONE TOUCH DELICA LANCING DEV) MISC Use to check FSBS three times a day. Dx: E11.59, Z79.4 12/31/19  Prentiss Dorothyann Maxwell, MD  levothyroxine  (SYNTHROID ) 50 MCG tablet TAKE 1 TABLET BY MOUTH EVERY DAY 11/03/20   Prentiss Dorothyann Maxwell, MD  omeprazole  (PRILOSEC) 40 MG capsule TAKE 1 CAPSULE BY MOUTH TWICE A DAY 08/20/24   Federico Rosario BROCKS, MD  sacubitril -valsartan  (ENTRESTO ) 49-51 MG Take 1 tablet by mouth 2 (two) times daily. 09/17/24   Croitoru, Mihai, MD  Semaglutide, 2 MG/DOSE, (OZEMPIC, 2 MG/DOSE,) 8 MG/3ML SOPN Inject 2 mg into the skin once a week. 08/23/23   [provider]  spironolactone  (ALDACTONE ) 25 MG tablet TAKE 1/2 TABLET BY MOUTH EVERY DAY 05/14/24   Croitoru, Mihai, MD  warfarin (COUMADIN ) 3 MG tablet MWFSun take 1.5  tablets and 1 tablet every other day Orally Once a day    [provider]    Current Outpatient Medications  Medication Sig Dispense Refill   aspirin  EC 81 MG tablet Take 81 mg by mouth daily.     atorvastatin  (LIPITOR ) 80 MG tablet TAKE 1 TABLET BY MOUTH EVERY DAY 90 tablet 3   Blood Glucose Monitoring Suppl (ONETOUCH VERIO) w/Device KIT Use to check FSBS three times a day. Dx: E11.59, Z79.4 1 kit 0   carvedilol  (COREG ) 25 MG tablet Take 1 tablet (25 mg total) by mouth 2 (two) times daily with a meal. 180 tablet 1   fluticasone  (FLONASE  ALLERGY  RELIEF) 50 MCG/ACT nasal spray Place 2 sprays into both nostrils as needed.     gabapentin  (NEURONTIN ) 300 MG capsule Take 300 mg by mouth 2 (two) times daily.     glucose blood (ONETOUCH VERIO) test strip Use to check FSBS three times a day. Dx: E11.59, Z79.4 100 each 2   HUMALOG  MIX 50/50 KWIKPEN (50-50) 100 UNIT/ML Kwikpen Inject 26 Units into the skin 2 (two) times daily. Dx: E11.59, Z79.4 15 mL 5   Insulin  Pen Needle (BD PEN NEEDLE MICRO U/F) 32G X 6 MM MISC Use twice a day with Humalog  Mix Kwikpen. Dx: E11.59, Z79.4 100 each 5   JARDIANCE 10 MG TABS tablet TAKE ONE TABLET BY MOUTH DAILY 90 tablet 2   Lancet Devices (ONE TOUCH DELICA LANCING DEV) MISC Use to check FSBS three times a day. Dx: E11.59, Z79.4 1 each 0   levothyroxine  (SYNTHROID ) 50 MCG tablet TAKE 1 TABLET BY MOUTH EVERY DAY 30 tablet 1   omeprazole  (PRILOSEC) 40 MG capsule TAKE 1 CAPSULE BY MOUTH TWICE A DAY 90 capsule 1   sacubitril -valsartan  (ENTRESTO ) 49-51 MG Take 1 tablet by mouth 2 (two) times daily. 180 tablet 3   spironolactone  (ALDACTONE ) 25 MG tablet TAKE 1/2 TABLET BY MOUTH EVERY DAY 45 tablet 1   Semaglutide, 2 MG/DOSE, (OZEMPIC, 2 MG/DOSE,) 8 MG/3ML SOPN Inject 2 mg into the skin once a week.     warfarin (COUMADIN ) 3 MG tablet MWFSun take 1.5 tablets and 1 tablet every other day Orally Once a day     Current Facility-Administered Medications  Medication Dose  Route Frequency Provider Last Rate Last Admin   0.9 %  sodium chloride  infusion  500 mL Intravenous Once Jennalynn Rivard C, MD        Allergies as of 10/16/2024 - Review Complete 10/16/2024  Allergen Reaction Noted   Lisinopril Cough 08/08/2015   Metformin Nausea Only 04/05/2021   Sitagliptin Nausea Only 04/05/2021    Family History  Problem Relation Age of Onset   Breast cancer Mother    Coronary artery disease Mother    Diabetes Mother    Stroke Father  Colon cancer Neg Hx    Rectal cancer Neg Hx    Stomach cancer Neg Hx     Social History   Socioeconomic History   Marital status: Widowed    Spouse name: Not on file   Number of children: Not on file   Years of education: Not on file   Highest education level: Not on file  Occupational History   Not on file  Tobacco Use   Smoking status: Former    Current packs/day: 0.00    Average packs/day: 0.3 packs/day    Types: Cigarettes    Quit date: 12/30/1963    Years since quitting: 60.8   Smokeless tobacco: Never   Tobacco comments:    pt smoked 5-6/cigs daily 12/30/2018  Vaping Use   Vaping status: Never Used  Substance and Sexual Activity   Alcohol use: Not Currently   Drug use: Never   Sexual activity: Not Currently    Birth control/protection: Surgical, Post-menopausal  Other Topics Concern   Not on file  Social History Narrative   Not on file   Social Drivers of Health   Tobacco Use: Medium Risk (10/16/2024)   Patient History    Smoking Tobacco Use: Former    Smokeless Tobacco Use: Never    Passive Exposure: Not on Actuary Strain: Not on file  Food Insecurity: Not on file  Transportation Needs: Not on file  Physical Activity: Not on file  Stress: Not on file  Social Connections: Not on file  Intimate Partner Violence: Not on file  Depression (PHQ2-9): Not on file  Alcohol Screen: Not on file  Housing: Not on file  Utilities: Not on file  Health Literacy: Not on file    Physical  Exam: Vital signs in last 24 hours: BP 131/73   Pulse 83   Temp (!) 97.3 F (36.3 C) (Skin)   Ht 5' 7 (1.702 m)   Wt 179 lb (81.2 kg)   SpO2 97%   BMI 28.04 kg/m  GEN: NAD EYE: Sclerae anicteric ENT: MMM CV: Non-tachycardic Pulm: No increased WOB GI: Soft NEURO:  Alert & Oriented   Estefana Kidney, MD [facility]   10/16/2024 1:02 PM  "

## 2024-10-16 NOTE — Progress Notes (Signed)
 Pt's states no medical or surgical changes since previsit or office visit.

## 2024-10-29 ENCOUNTER — Ambulatory Visit

## 2024-12-03 ENCOUNTER — Encounter

## 2025-03-04 ENCOUNTER — Encounter

## 2025-06-03 ENCOUNTER — Encounter

## 2025-09-06 ENCOUNTER — Encounter
